# Patient Record
Sex: Female | Born: 1985
Health system: Southern US, Community
[De-identification: ages and names within clinical notes are randomized; demographics above are authoritative.]

## PROBLEM LIST (undated history)

## (undated) DIAGNOSIS — F32A Depression, unspecified: Secondary | ICD-10-CM

## (undated) DIAGNOSIS — N750 Cyst of Bartholin's gland: Secondary | ICD-10-CM

## (undated) DIAGNOSIS — R519 Headache, unspecified: Secondary | ICD-10-CM

## (undated) DIAGNOSIS — Z8742 Personal history of other diseases of the female genital tract: Secondary | ICD-10-CM

## (undated) DIAGNOSIS — F329 Major depressive disorder, single episode, unspecified: Secondary | ICD-10-CM

## (undated) DIAGNOSIS — R51 Headache: Secondary | ICD-10-CM

## (undated) DIAGNOSIS — J45909 Unspecified asthma, uncomplicated: Secondary | ICD-10-CM

## (undated) DIAGNOSIS — F209 Schizophrenia, unspecified: Secondary | ICD-10-CM

## (undated) DIAGNOSIS — Q513 Bicornate uterus: Secondary | ICD-10-CM

## (undated) DIAGNOSIS — F4310 Post-traumatic stress disorder, unspecified: Secondary | ICD-10-CM

## (undated) DIAGNOSIS — K219 Gastro-esophageal reflux disease without esophagitis: Secondary | ICD-10-CM

## (undated) DIAGNOSIS — G4730 Sleep apnea, unspecified: Secondary | ICD-10-CM

## (undated) DIAGNOSIS — S62509A Fracture of unspecified phalanx of unspecified thumb, initial encounter for closed fracture: Secondary | ICD-10-CM

## (undated) DIAGNOSIS — F419 Anxiety disorder, unspecified: Secondary | ICD-10-CM

## (undated) HISTORY — DX: Unspecified asthma, uncomplicated: J45.909

## (undated) HISTORY — DX: Schizophrenia, unspecified: F20.9

## (undated) HISTORY — PX: CARDIAC CATHETERIZATION: SHX172

## (undated) HISTORY — PX: WISDOM TOOTH EXTRACTION: SHX21

## (undated) HISTORY — DX: Post-traumatic stress disorder, unspecified: F43.10

## (undated) HISTORY — PX: APPENDECTOMY: SHX54

## (undated) HISTORY — PX: DIAGNOSTIC LAPAROSCOPY: SUR761

---
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
## 2004-06-21 ENCOUNTER — Emergency Department (HOSPITAL_COMMUNITY): Admission: EM | Admit: 2004-06-21 | Discharge: 2004-06-22 | Payer: Self-pay | Admitting: Emergency Medicine

## 2004-06-22 ENCOUNTER — Inpatient Hospital Stay (HOSPITAL_COMMUNITY): Admission: EM | Admit: 2004-06-22 | Discharge: 2004-06-26 | Payer: Self-pay | Admitting: Psychiatry

## 2004-09-04 ENCOUNTER — Encounter (INDEPENDENT_AMBULATORY_CARE_PROVIDER_SITE_OTHER): Payer: Self-pay | Admitting: *Deleted

## 2004-09-04 ENCOUNTER — Inpatient Hospital Stay (HOSPITAL_COMMUNITY): Admission: EM | Admit: 2004-09-04 | Discharge: 2004-09-05 | Payer: Self-pay | Admitting: Emergency Medicine

## 2004-09-04 HISTORY — PX: OTHER SURGICAL HISTORY: SHX169

## 2004-10-16 ENCOUNTER — Emergency Department (HOSPITAL_COMMUNITY): Admission: EM | Admit: 2004-10-16 | Discharge: 2004-10-16 | Payer: Self-pay | Admitting: Emergency Medicine

## 2004-10-27 ENCOUNTER — Emergency Department: Payer: Self-pay | Admitting: Emergency Medicine

## 2004-11-16 ENCOUNTER — Other Ambulatory Visit: Payer: Self-pay

## 2004-11-16 ENCOUNTER — Emergency Department: Payer: Self-pay | Admitting: Emergency Medicine

## 2004-11-21 ENCOUNTER — Ambulatory Visit: Payer: Self-pay | Admitting: Emergency Medicine

## 2006-01-06 ENCOUNTER — Emergency Department (HOSPITAL_COMMUNITY): Admission: EM | Admit: 2006-01-06 | Discharge: 2006-01-06 | Payer: Self-pay | Admitting: Emergency Medicine

## 2006-01-15 ENCOUNTER — Emergency Department (HOSPITAL_COMMUNITY): Admission: EM | Admit: 2006-01-15 | Discharge: 2006-01-15 | Payer: Self-pay | Admitting: Emergency Medicine

## 2006-01-20 ENCOUNTER — Inpatient Hospital Stay (HOSPITAL_COMMUNITY): Admission: AD | Admit: 2006-01-20 | Discharge: 2006-01-20 | Payer: Self-pay | Admitting: Obstetrics & Gynecology

## 2006-01-24 ENCOUNTER — Ambulatory Visit: Payer: Self-pay | Admitting: Obstetrics and Gynecology

## 2006-01-28 ENCOUNTER — Emergency Department (HOSPITAL_COMMUNITY): Admission: EM | Admit: 2006-01-28 | Discharge: 2006-01-29 | Payer: Self-pay | Admitting: Emergency Medicine

## 2006-03-18 ENCOUNTER — Ambulatory Visit: Admission: RE | Admit: 2006-03-18 | Discharge: 2006-03-18 | Payer: Self-pay | Admitting: Obstetrics and Gynecology

## 2006-03-18 ENCOUNTER — Encounter (INDEPENDENT_AMBULATORY_CARE_PROVIDER_SITE_OTHER): Payer: Self-pay | Admitting: *Deleted

## 2006-03-18 ENCOUNTER — Ambulatory Visit: Payer: Self-pay | Admitting: Gynecology

## 2006-03-18 HISTORY — PX: OTHER SURGICAL HISTORY: SHX169

## 2006-04-01 ENCOUNTER — Ambulatory Visit: Payer: Self-pay | Admitting: *Deleted

## 2006-04-01 ENCOUNTER — Ambulatory Visit: Payer: Self-pay | Admitting: Family Medicine

## 2006-04-03 ENCOUNTER — Ambulatory Visit: Payer: Self-pay | Admitting: Obstetrics and Gynecology

## 2006-04-17 ENCOUNTER — Ambulatory Visit: Payer: Self-pay | Admitting: Family Medicine

## 2006-04-22 ENCOUNTER — Ambulatory Visit: Payer: Self-pay | Admitting: Family Medicine

## 2008-11-12 ENCOUNTER — Emergency Department (HOSPITAL_COMMUNITY): Admission: EM | Admit: 2008-11-12 | Discharge: 2008-11-12 | Payer: Self-pay | Admitting: Emergency Medicine

## 2009-03-16 ENCOUNTER — Encounter: Payer: Self-pay | Admitting: Family

## 2009-03-16 ENCOUNTER — Encounter: Payer: Self-pay | Admitting: Obstetrics and Gynecology

## 2009-03-16 ENCOUNTER — Ambulatory Visit: Payer: Self-pay | Admitting: Obstetrics & Gynecology

## 2009-03-22 ENCOUNTER — Ambulatory Visit (HOSPITAL_COMMUNITY): Admission: RE | Admit: 2009-03-22 | Discharge: 2009-03-22 | Payer: Self-pay | Admitting: Obstetrics & Gynecology

## 2009-03-23 ENCOUNTER — Ambulatory Visit: Payer: Self-pay | Admitting: Obstetrics & Gynecology

## 2010-06-27 ENCOUNTER — Ambulatory Visit: Payer: Self-pay | Admitting: Gastroenterology

## 2010-09-20 ENCOUNTER — Ambulatory Visit: Payer: Self-pay | Admitting: Gastroenterology

## 2010-10-10 ENCOUNTER — Ambulatory Visit: Payer: Self-pay | Admitting: Surgery

## 2011-04-12 LAB — POCT URINALYSIS DIP (DEVICE)
Bilirubin Urine: NEGATIVE
Glucose, UA: NEGATIVE mg/dL
Hgb urine dipstick: NEGATIVE
Ketones, ur: NEGATIVE mg/dL
Nitrite: NEGATIVE
Protein, ur: NEGATIVE mg/dL
Specific Gravity, Urine: 1.01 (ref 1.005–1.030)
Urobilinogen, UA: 0.2 mg/dL (ref 0.0–1.0)
pH: 7 (ref 5.0–8.0)

## 2011-05-15 NOTE — Group Therapy Note (Signed)
NAME:  [name], [name] NO.:  [id number]   MEDICAL RECORD NO.:  [id number]          PATIENT TYPE:  WOC   LOCATION:  [facility]                   FACILITY:  [facility]   PHYSICIAN:  Sid Falcon, CNM  DATE OF BIRTH:  Mar 19, 1986   DATE OF SERVICE:                                  CLINIC NOTE   The patient is here for left-sided pelvic pain that has been occurring  for the last year and a half, __________ the last two weeks.  The  patient has a history of laparoscopic removal of right urine horn and  right salpingo-oophorectomy in March of 2007.  The patient has reported  that due to significant hyperplasia.  The patient has reported similar  symptoms now on the right side, denies at level bleeding, no change in  bowel pattern.  No weight loss.  No abnormal discharge.  Urinary  frequency x1 year.  She saw urologist in [place] and __________ and  follow-up tests were negative.  The patient is rating the pain a 10/10  at its worst and desires treatment as indicated.  Last Pap smear was  three years ago.  Denies history of abnormal Paps.  The patient is a  lesbian and in a relationship with a female, denies pregnancy.   PAST SURGICAL HISTORY:  Appendectomy.   PHYSICAL EXAMINATION:  GENERAL:  The patient is alert and oriented x3,  no signs of acute distress.  PELVIC:  Uterus enlarged, palpated past below the umbilicus.  Adnexa  tender above.  No dominant masses.  Cervix:  No abnormal lesion, no  abnormal discharge.  Pap smear obtained without difficulty.  NECK:  No cervical motion tenderness.   ASSESSMENT:  Chronic pelvic pain.   PLAN:  Obtain a pelvic ultrasound, possible laparoscopic procedure.  Await ultrasound results.  The  patient will follow up in one week to  review the results of the ultrasound.      Sid Falcon, CNM     WM/MEDQ  D:  03/16/2009  T:  03/16/2009  Job:  [id number]

## 2011-05-15 NOTE — Group Therapy Note (Signed)
NAMEKHLOEY, CHERN NO.:  [id number]   MEDICAL RECORD NO.:  [id number]          PATIENT TYPE:  WOC   LOCATION:  [facility]                   FACILITY:  [facility]   PHYSICIAN:  Johnella Moloney, MD        DATE OF BIRTH:  Feb 26, 1986   DATE OF SERVICE:  03/23/2009                                  CLINIC NOTE   [name] is a 25 year old female that returns to the clinic for ultrasound  results after chronic left-sided pelvic and abdominal pain.  She states  that the pain waxes and wanes, no associated nausea, vomiting, diarrhea  or bloody stools with it.   PHYSICAL EXAM:  Temperature is 97.4, pulse is 80, blood pressure 99/64,  weight is 126.5, height is 5 feet 4.  GENERAL:  Patient well-appearing, no apparent distress.  Alert and  oriented x4.  ABDOMEN:  Soft, nontender, nondistended.  No palpable masses.  No  hepatosplenomegaly.   X-RAY RESULTS:  Pelvic and transvaginal ultrasound done on March 22, 2009 showed normal uterus and left ovary.  Also showed previous right  oophorectomy, no evidence of pelvic mass or acute findings.   ASSESSMENT:  This is a 25 year old female with a history of multiple  personality disorder and also with history of chronic left-sided pelvic  and abdominal pain with a normal ultrasound.   PLAN:  Patient was reassured that this is not GYN related.  Referred  patient to [facility] to establish health care, possibly with  [facility].  Patient was instructed that this could possibly be GI and  be worked up for possible IBS or other GI etiologies.  Patient was also  instructed to return if her pain worsens, if she has symptoms of severe  pain, nausea, vomiting, fever, chills.  Patient was in agreement with  the plan and will follow up as needed.     ______________________________  Jonn Shingles, MD    ______________________________  Johnella Moloney, MD    KL/MEDQ  D:  03/23/2009  T:  03/23/2009  Job:  [id number]

## 2011-05-18 NOTE — Group Therapy Note (Signed)
[name], [name] NO.:  [id number]   MEDICAL RECORD NO.:  [id number]          PATIENT TYPE:  WOC   LOCATION:  [facility]                   FACILITY:  [facility]   PHYSICIAN:  Argentina Donovan, MD        DATE OF BIRTH:  19-Apr-1986   DATE OF SERVICE:  01/24/2006                                    CLINIC NOTE   The patient is a 25 year old nulligravida lesbian white female who has  chronic right lower quadrant pain extending from the inguinal area up to  about the umbilicus.  The pain is tender to palpation without rebound or  guarding.  The patient had laparoscopic surgery two years ago at which time  she had an appendectomy and drain of an ovarian cyst for the same reason.  The pain is worse with her periods, although she has it constantly and  during her period but prior to her period she develops nausea and vomiting  and is really quite disabled according to her aunt who takes care of her.  She is 108 pounds, is 5 feet 4 inches tall.   PHYSICAL EXAMINATION:  ABDOMEN:  Soft, flat, nontender except as above  mentioned with no sign of organomegaly.  PELVIC:  Vagina is clean.  She is having a period at this time.  We did a  Pap smear for the first time.  The cervix is nulliparous.  Uterus normal  size, shape, and consistency.  The adnexa seems normal.   The patient had a recent CT and ultrasound.  The CT is disturbing because it  showed what looked like a cyst as well as a normal appendix, although the  patient has no appendix.  The sonogram showed a normal pelvis with no signs  of cysts.  Whether this young lady has endometriosis or scar tissue in the  pelvis or recurrent ovarian cysts or an ovary that is trapped in adhesions I  do not know but I think at least she deserves a laparoscopic examination.  She is also begging for an oophorectomy on that side.  We have told her the  disadvantages of that but if we find anything that is wrong with that ovary  we will remove  it.  We are going to schedule her for laparoscopic  examination ASAP with a possible exploratory laparotomy.           ______________________________  Argentina Donovan, MD     PR/MEDQ  D:  01/24/2006  T:  01/25/2006  Job:  [id number]

## 2011-05-18 NOTE — H&P (Signed)
[name], [name] NO.:  [id number]   MEDICAL RECORD NO.:  [id number]                   PATIENT TYPE:  INP   LOCATION:  5506                                 FACILITY:  [facility]   PHYSICIAN:  Velora Heckler, M.D.                DATE OF BIRTH:  March 21, 1986   DATE OF ADMISSION:  09/03/2004  DATE OF DISCHARGE:                                HISTORY & PHYSICAL   REFERRING PHYSICIAN:  Carleene Cooper, M.D.   CHIEF COMPLAINT:  Abdominal pain, nausea, vomiting, probable acute  appendicitis.   HISTORY OF PRESENT ILLNESS:  The patient is a 25 year old white female who  presents to the emergency department with 30-hour history of abdominal pain  localized to the right lower quadrant.  This is associated with onset of  nausea, vomiting, fevers, chills, and sweats.  The patient has remained  anorexic.  She was seen in the emergency department and noted to have a  normal white blood cell count.  CT scan abdomen and pelvis was obtained and  shows a fluid-filled mass in the right lower quadrant consistent with either  acute appendicitis or abscess or possibly an ovarian abnormality.  The  patient relates a history of intermittent pain for approximately six months.  However, the past 24 hours has been significantly more intense, again  associated with nausea, vomiting, and fever.  General surgery is consulted  at this time for consideration of appendectomy.   PAST MEDICAL HISTORY:  History of schizophrenia, no prior surgical  intervention.   MEDICATIONS:  Celexa, Seroquel, Valium.   ALLERGIES:  None known.   SOCIAL HISTORY:  The patient lives with her aunt.  She smokes a pack of  cigarettes a day.  She drinks alcohol in moderation.  She denies illicit  drug use.  She works part-time at the [employer].   FAMILY HISTORY:  Noncontributory.   REVIEW OF SYSTEMS:  Fifteen-system review without significant other  positive.   PHYSICAL EXAMINATION:  GENERAL:  A  24 year old thin white female on a  stretcher in the emergency department.  VITAL SIGNS:  Temperature 97.1, pulse 80, respirations 28, blood pressure  101/48.  HEENT:  Normocephalic.  Sclerae are clear.  Pupils are equal and reactive.  Dentition is good.  Mucous membranes are dry.  Voice is normal.  NECK:  Supple without mass.  Thyroid is normal without nodularity.  There is  no lymphadenopathy.  CHEST:  Lungs are clear to auscultation bilaterally without rales, rhonchi,  or wheeze.  CARDIAC:  Regular rate and rhythm without murmur.  Peripheral pulses are  full.  ABDOMEN:  Soft, scaphoid.  There is a tattoo in the right lower quadrant.  There are no surgical wounds.  There is no hepatosplenomegaly.  There is  diffuse tenderness, maximum in the right lower quadrant.  There is voluntary  guarding.  The patient will not  allow testing for rebound.  There is no sign  of hernia.  There are no surgical wounds.  EXTREMITIES:  Nontender without edema.  NEUROLOGIC:  The patient is alert and oriented without focal deficit.   LABORATORY DATA:  White count 7.5, hemoglobin 12.8, platelet count 204,000.  Differential is normal with 60% neutrophils, 32% lymphocytes, and 6%  monocytes.  Urinalysis is benign.  Urine pregnancy test is negative.  I-STAT  8:  Electrolytes are within normal limits.   RADIOGRAPHIC STUDIES:  CT scan abdomen and pelvis reviewed in the emergency  department shows a 3 x 4 cm fluid-filled mass lateral to the cecum in the  right lower quadrant.   IMPRESSION:  Probable acute appendicitis, possible periappendiceal abscess.   PLAN:  1.  Admission to [facility].  2.  Initiation of intravenous antibiotics.  3.  To operating room for laparoscopy and possible laparoscopic      appendectomy.  4.  Routine postoperative care.   I discussed at length with the patient and her length and her aunt, who  accompanies her, the indications for surgery.  I explained the location  of  the surgical wounds.  I discussed the possibility of conversion to open  incision.  I discussed her hospital stay to be expected.  They understand  and wish to proceed.  Will make arrangements with the operating room  immediately.                                                Velora Heckler, M.D.    TMG/MEDQ  D:  09/04/2004  T:  09/04/2004  Job:  [id number]

## 2011-05-18 NOTE — Discharge Summary (Signed)
[name], [name]                               ACCOUNT NO.:  [id number]   MEDICAL RECORD NO.:  [id number]                   PATIENT TYPE:  INP   LOCATION:  0101                                 [facility]   PHYSICIAN:  Cindie Crumbly, M.D.               DATE OF BIRTH:  [birth year]   DATE OF ADMISSION:  03/08/2003  DATE OF DISCHARGE:  03/12/2003                                 DISCHARGE SUMMARY   REASON FOR ADMISSION:  This 25 year old white female was admitted  complaining of depression status post overdose as a suicide attempt.  For  further history of present illness, please see the patient's psychiatric  admission assessment.   PHYSICAL EXAMINATION:  At the time of admission was significant for a  history of allergic rhinitis.  She also had a history of arthritis of her  hip from congenital hip dysplasia.  She had an otherwise unremarkable  physical examination.   LABORATORY DATA:  The patient underwent a laboratory workup to rule out any  medical problems contributing to her symptomatology.  A hepatic panel was  within normal limits.  GGT was within normal limits.  TSH and free T4 were  within normal limits.  UA was unremarkable.  RPR was nonreactive.  Urine  probe for gonorrhea and chlamydia were negative.  The patient received no x-  rays, no special procedures, no additional consultations.  She sustained no  complications during the course of this hospitalization.  All further  laboratory examination was done at [facility] prior to the  patient being admitted to this facility for a more definitive stabilization.   HOSPITAL COURSE:  On admission, the patient was psychomotor agitated with  decreased concentration and poor impulse control.  She displayed an  excessive reliance on histrionic and borderline defense mechanisms.  Her  affect and mood were depressed, irritable, angry and anxious.  She was  initially begun on a trial of Zoloft but developed  nausea and vomiting.  After an additional risks/benefits discussion, the patient was started on a  trial of Celexa, tolerated this medication well without side effects and, at  the time of discharge, she denies any suicidal or homicidal ideation.  She  remained somewhat superficial and guarded but is able to contract for  safety, is motivated for outpatient therapy and, consequently, is felt to  have reached her maximum benefits of hospitalization and is ready for  discharge to a less restrictive alternative setting.  She has displayed no  evidence of a thought disorder throughout her hospital course.   CONDITION ON DISCHARGE:  Improved.   DIAGNOSES (ACCORDING TO DSM-IV):   AXIS I:  1. Major depression, single episode, severe without psychosis.  2. Nicotine dependence.  3. Cannabis abuse.  4. Rule out polysubstance dependence.  5. Rule out substance-induced mood disorder.   AXIS II:  1. Histrionic and borderline traits.  2. Rule out personality disorder not otherwise specified.  3. Rule out learning disorder not otherwise specified.   AXIS III:  1. Allergic rhinitis.  2. Congential hip dysplasia.   AXIS IV:  Current psychosocial stressors are severe.   AXIS V:  20 on admission; 30 on discharge.   FURTHER EVALUATION AND TREATMENT RECOMMENDATIONS:  1. The patient is discharged to home.  2. She is discharged on Paxil CR 12.5 mg p.o. q.a.m.  3. She is discharged on an unrestricted level of activity and a regular     diet.  4. She will follow up with Dr. Mariana Single at [facility] outpatient clinic for all further aspects of her psychiatric care     and, consequently, I will sign off on the case at this time.  She will     follow up with her primary care physician for all further aspects of her     medical care.                                               Cindie Crumbly, M.D.    TS/MEDQ  D:  03/15/2003  T:  03/15/2003  Job:  [id number]

## 2011-05-18 NOTE — H&P (Signed)
[name], [name] NO.:  [id number]   MEDICAL RECORD NO.:  [id number]                   PATIENT TYPE:  INP   LOCATION:  0101                                 [facility]   PHYSICIAN:  Cindie Crumbly, M.D.               DATE OF BIRTH:  1986-11-08   DATE OF ADMISSION:  03/08/2003  DATE OF DISCHARGE:                         PSYCHIATRIC ADMISSION ASSESSMENT   j   PATIENT IDENTIFICATION:  This 25 year old white female was admitted  complaining of depression status post overdose on Friday, March 04, 2003,  with a bottle of approximately 30 ibuprofen tablets.  She told several  friends including her girlfriend.  Her girlfriend stated that she no longer  wished to have a relationship with her and the patient, at the present time,  states that she plans to shoot herself with a gun to kill herself and has  access and means and that her father has several guns in the household that  she has access to.   HISTORY OF PRESENT ILLNESS:  The patient admits to an increasingly  depressed, irritable, and angry mood most of the day nearly every day along  with increasing anxiety and anhedonia over the past two years and increasing  significantly over the past three to four months.  She admits to feelings of  hopelessness, helplessness, and worthlessness, decreased concentration and  energy level, increased symptoms of fatigue, insomnia, decreased appetite,  recurrent thoughts of death, psychomotor agitation, decreased school  performance, giving up on activities previously found pleasurable.  She  refuses to contract for safety at this time.   PAST PSYCHIATRIC HISTORY:  The patient's past psychiatric history is  significant for her being in outpatient therapy with Dr. Christell Constant for the past  two months for outpatient psychotherapy.  She has had no previous inpatient  hospitalizations and has no previous history of treatment with psychiatric  medications.   SUBSTANCE ABUSE HISTORY:  The patient's drug and alcohol abuse history is  significant for the patient smoking marijuana occasionally.  She also  reports smoking a half a pack of cigarettes per day for the past several  years.  She denies any use of alcohol or other street drugs.   PAST MEDICAL HISTORY:  She has a past medical history of seasonal allergic  rhinitis.  She presently takes Zyrtec 10 mg per day but states, It doesn't  work and I don't need to take it.   ALLERGIES:  She has no known drug allergies or sensitivities.   FAMILY AND SOCIAL HISTORY:  The patient lives with her mother, father,  sister, and brother.  She is currently in the 12th grade and plans on going  to junior college after completing this year in school.   MENTAL STATUS EXAM:  The patient presents as a well developed, well  nourished, adolescent white female who is alert and oriented x 4,  psychomotor agitated, and whose appearance is compatible with her stated  age.  She is disheveled, unkempt with poor hygiene, decreased concentration  and energy level, poor impulse control.  She displays and excessive reliance  on histrionic and borderline defense mechanisms.  Her affect and mood are  depressed, irritable, angry, and anxious.  Immediate recall, short-term  memory, and remote memory are intact.  Similarities and differences are  within normal limits and she is able to abstract to proverbs.  Her thought  processes are goal directed.   ADMISSION DIAGNOSES:   AXIS I:  1. Major depression, single episode, severe without psychosis.  2. Nicotine dependence.  3. Cannabis abuse.  4. Rule out polysubstance dependence.  5. Rule out substance-induced mood disorder.   AXIS II:  1. Histrionic and borderline traits.  2. Rule out personality disorder, not otherwise specified.  3. Rule out learning disorder, not otherwise specified.   AXIS III:  Allergic rhinitis.   AXIS IV:  Severe.   AXIS V:  20   ASSETS  AND STRENGTHS:  Her parents are supportive of her.   INITIAL PLAN OF CARE:  Initial plan of care is to begin the patient on a  trial of Zoloft for her depression.  She has begun on admission on a p.r.n.  basis receiving Remeron for sleep.  Psychotherapy will focus on improving  the patient's impulse control, decreasing cognitive distortions and  potential for self-harm.  A laboratory workup will also be initiated to rule  out any other medical problems contributing to her symptomatology.   ESTIMATED LENGTH OF STAY:  The estimated length of stay for the patient on  the inpatient unit is five to seven days.   POST HOSPITAL CARE PLAN:  Initial discharge plan is to discharge the patient  to home.                                               Cindie Crumbly, M.D.    TS/MEDQ  D:  03/09/2003  T:  03/09/2003  Job:  [id number]

## 2011-05-18 NOTE — Op Note (Signed)
NAMEANASTAZJA, ISAAC NO.:  [id number]   MEDICAL RECORD NO.:  [id number]                   PATIENT TYPE:  INP   LOCATION:  5506                                 FACILITY:  [facility]   PHYSICIAN:  Velora Heckler, M.D.                DATE OF BIRTH:  1986/12/16   DATE OF PROCEDURE:  09/04/2004  DATE OF DISCHARGE:                                 OPERATIVE REPORT   PREOPERATIVE DIAGNOSIS:  Acute appendicitis.   POSTOPERATIVE DIAGNOSIS:  Ovarian cyst, normal appendix.   OPERATION PERFORMED:  1.  Laparoscopic appendectomy.  2.  Fenestration of ovarian cyst.   SURGEON:  Velora Heckler, M.D.   ANESTHESIA:  General.   ESTIMATED BLOOD LOSS:  Minimal.   PREPARATION:  Betadine.   COMPLICATIONS:  None.   INDICATIONS FOR PROCEDURE:  The patient is a 25 year old white female who  presents to the emergency department with greater than 24 hour history of  right lower quadrant pain.  This has been associated with nausea, vomiting,  fever, chills and sweats over the past 24 hours.  The patient is now  anorexic.  She was evaluated by the emergency room doctors.  White blood  cell count was normal.  Differential was normal.  However, CT scan of the  abdomen and pelvis showed a fluid filled mass in the right lower quadrant  worrisome for acute appendicitis versus possible abscess.  The patient is  now brought to the operating room for laparoscopy and appendectomy.   DESCRIPTION OF PROCEDURE:  The procedure was done in operating room #16 at  the [facility].  The patient was brought to the  operating room and placed in supine position on the operating room table.  Following administration of general anesthesia, the patient was prepped and  draped in the usual strict aseptic fashion.  After ascertaining that an  adequate level of anesthesia had been obtained, a supraumbilical incision  was made in the midline.  Dissection was carried down to the  fascia.  The  fascia was incised in the midline.  The peritoneal cavity was entered  cautiously.  0 Vicryl pursestring suture was placed in the fascia.  A Hasson  cannula was introduced under direct vision and secured with the pursestring  suture.  The abdomen was insufflated with carbon dioxide.  The laparoscope  was introduced under direct vision and the abdomen explored.  Representative  photographs were taken.  There was a large cystic mass in the colic gutter  lateral to the mid ascending colon.  Operative ports were placed in the  right upper quadrant and left lower quadrant.  Using a Glassman clamp, the  cyst was manipulated.  There is a structure representing the tube and ovary  with came out of the pelvis, over the pelvic brim and into the right colic  gutter.  The cyst  was associated with the superior aspect of the ovary.  During manipulation the cyst was accidentally ruptured.  It contained dark  bloody colored fluid.  This was evacuated.  The cyst decompressed nearly  completely.  Further photographs were taken.  The cecum was mobilized with  the Glassman clamp with the patient in steep Trendelenburg.  The appendix  was exposed.  It appeared grossly normal.  Using the Harmonic scalpel, the  mesoappendix was taken down to the base of the appendix.  The base of the  appendix was transected with an Endo GIA stapler.  Good hemostasis was noted  at the staple line.  Representative photographs were taken of the staple  line on the cecal wall.  The appendix was placed into an EndoCatch bag and  withdrawn through the left lower quadrant port.  The abdomen was irrigated  with saline which was evacuated.  Ports were removed under direct vision.  Good hemostasis was noted.  All ports were removed and pneumoperitoneum was  released.  0 Vicryl pursestring sutures tied securely.  Operative sites were  anesthetized with local anesthetic.  All three wounds were closed with  interrupted 4-0  subcuticular suture.  Wounds were washed and dried.  Benzoin  and Steri-Strips were applied.  Sterile dressings were applied.  The patient  was awakened from anesthesia and brought to the recovery room in stable  condition.  The patient tolerated the procedure well.                                               Velora Heckler, M.D.    TMG/MEDQ  D:  09/04/2004  T:  09/04/2004  Job:  [id number]
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
## 2011-05-18 NOTE — Discharge Summary (Signed)
NAMELULANI, BOUR NO.:  [id number]   MEDICAL RECORD NO.:  [id number]                   PATIENT TYPE:  IPS   LOCATION:  0305                                 [facility]ACILITY:  [facility]   PHYSICIAN:  Jeanice Lim, M.D.              DATE OF BIRTH:  1986/01/16   DATE OF ADMISSION:  10/05/2003  DATE OF DISCHARGE:  10/09/2003                                 DISCHARGE SUMMARY   IDENTIFYING DATA:  This is an 25 year old single Caucasian female  readmitted.  The patient had gone to live with the aunt in the no stress  zone but reported that she was upset over the weekend about a painful  separation, knew that mother was upset over support issues, and thought it  would be better if she wasn't here.  The patient was having suicidal  thoughts, vague, and reports that she felt worthless and should just end  all.  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   MEDICATIONS:  1. Claritin.  2. Risperdal.  3. Valium.  4. Celexa.   DRUG ALLERGIES:  No known drug allergies.   PHYSICAL EXAMINATION:  GENERAL:  Essentially within normal limits.  NEUROLOGIC:  Nonfocal.   LABORATORY DATA:  Routine admission labs: Within normal limits.   MENTAL STATUS EXAM:  Fully alert, some psychomotor slowing, mild.  Speech:  Within normal limits, monotone.  Mood: Depressed.  Affect: Restricted.  Thought process: Goal directed.  Thought content: Positive for auditory  hallucinations, suicidal ideation, and visual hallucinations prior to  admission.  Cognitive: Intact.  She tended to minimize the severity of her  symptoms.   ADMISSION DIAGNOSES:   AXIS I:  Major depressive disorder, recurrent with psychosis versus rule out  bipolar II.   AXIS II:  Deferred.   AXIS III:  Allergic rhinitis.   AXIS IV:  Moderate problems related to limited support system and stress  related to school and life transition change.   AXIS V:  30/60   HOSPITAL COURSE:  The patient was  admitted, ordered routine p.r.n.  medications, underwent further monitoring, and was encouraged to participate  in individual, group, and milieu therapy.  She underwent safety monitoring.  She gradually reported an increase in insight, feeling upset about parents'  separation but reported a decrease in lability, increase in coping skills,  denying any suicidal thoughts after being admitted, reporting that she felt  she had a more positive attitude and things were more in perspective.  She  had lost sight of things but now was feeling she could cope with her stress  and there was no suicidal thoughts or other dangerous ideation.  She  demonstrated improved judgment and insight.  The patient showed a response  to clinical intervention.   CONDITION ON DISCHARGE:  Her condition on discharge was markedly improved.  Mood was more euthymic.  Affect:  Brighter.  Thought processes: Goal  directed.  Thought content: Negative for dangerous ideation or psychotic  symptoms.  The patient reported motivation to be compliant with the  aftercare plan.   DISCHARGE MEDICATIONS:  1. Claritin 10 mg q.a.m.  2. Risperdal 0.5 mg one half q.a.m., one half at 3 p.m., and four q.h.s.  3. Lexapro 20 mg one and a half q.a.m.  4. Valium 10 mg one half q.a.m., one half at 3 p.m., and one q.h.s.  5. Ambien 10 mg q.h.s. p.r.n. insomnia.   FOLLOW UP:  The patient was to follow up with Milagros Evener, M.D., and [facility].   DISCHARGE DIAGNOSES:   AXIS I:  Major depressive disorder, recurrent with psychosis versus rule out  bipolar II.   AXIS II:  Deferred.   AXIS III:  Allergic rhinitis.   AXIS IV:  Moderate problems related to limited support system and stress  related to school and life transition change.   AXIS V:  Global assessment of functioning on discharge was 55.                                               Jeanice Lim, M.D.    JEM/MEDQ  D:  11/03/2003  T:  11/04/2003  Job:  [id number]

## 2011-05-18 NOTE — Discharge Summary (Signed)
[name], HORKEY NO.:  [id number]   MEDICAL RECORD NO.:  [id number]                   PATIENT TYPE:  IPS   LOCATION:  0301                                 [facility]ACILITY:  [facility]   PHYSICIAN:  Jeanice Lim, M.D.              DATE OF BIRTH:  1986/12/13   DATE OF ADMISSION:  09/26/2003  DATE OF DISCHARGE:  09/29/2003                                 DISCHARGE SUMMARY   IDENTIFYING DATA:  This is an 25 year old single Caucasian female  voluntarily admitted with a history of a suicide attempt by overdosing on  aspirin.  Had been hospitalized previously for suicidal thoughts.  Referred  by her therapist in the past.  The patient reported hearing voices on  occasion at night, telling her to hurt herself.  She had broken up with a  friend a few weeks ago and feels she has limited support.  The patient had  side effects to past medications.   MEDICATIONS:  Amoxicillin, Valium 5 mg b.i.d. and Risperdal 2 mg q.h.s.   ALLERGIES:  No known drug allergies.   PHYSICAL EXAMINATION:  Essentially within normal limits.  Neurologically  nonfocal.   LABORATORY DATA:  Routine admission labs within normal limits.   MENTAL STATUS EXAM:  Fully alert, cooperative, depressed.  Speech soft,  apathetic.  Affect flat.  Thought processes goal directed and thought  content positive for suicidal ideation without plan.  Cognitively intact.  Judgment and insight fair to poor.  Positive auditory and visual  hallucinations prior to admission.   ADMISSION DIAGNOSES:   AXIS I:  Rule out major depression not otherwise specified.   AXIS II:  Deferred.   AXIS III:  Rhinosinusitis.   AXIS IV:  Severe (stress at home and problems with primary support group).   AXIS V:  28/55-60.   HOSPITAL COURSE:  The patient was admitted and ordered routine p.r.n.  medications and underwent further monitoring.  Was encouraged to participate  in individual, group and milieu therapy.  The  patient reported having  suicidal ideation prior to admission.  Had a gun and bullets to shoot  herself.  Felt ignored.  Positive auditory and visual hallucinations for 1-2  years, which increase when stressed.  Some auditory hallucinations tell her  to kill herself.  The patient had broken up with a girlfriend just prior to  admission and parents were separating.  The patient was optimized on Valium  and Risperdal targeting acute anxiety and psychotic symptoms.  The patient  participated fully in therapy, reported a positive response to medication  changes and clinical intervention with no side effects.  Reported almost  complete resolution of voices after hearing them for two years, which was a  relief.  Follow-up on the gun to ensure that it was secured prior to  discharge.   CONDITION ON DISCHARGE:  The patient was discharged in  improved condition.  Mood euthymic.  Affect brighter.  Thought processes goal directed.  Thought  content negative for dangerous ideation or psychotic symptoms.   DISCHARGE MEDICATIONS:  1. Augmentin 875 mg b.i.d. x 4 days.  2. Claritin 10 mg q.a.m.  3. Risperdal 0.5 mg, 1/2 q.a.m., 1/2 at 3 p.m. and 4 q.h.s.  4. Valium 10 mg, 1/2 q.a.m., 1/2 at 3 p.m. and 1 q.h.s.  5. Celexa 20 mg q.a.m.  6. Ambien 10 mg q.h.s. p.r.n. insomnia.   FOLLOW UP:  The patient was to follow up for medication monitoring within  seven days and therapy.   DISCHARGE DIAGNOSES:   AXIS I:  Rule out major depression not otherwise specified.   AXIS II:  Deferred.   AXIS III:  Rhinosinusitis.   AXIS IV:  Severe (stress at home and problems with primary support group).   AXIS V:  Global Assessment of Functioning on discharge 55.                                               Jeanice Lim, M.D.    JEM/MEDQ  D:  11/02/2003  T:  11/02/2003  Job:  [id number]

## 2011-05-18 NOTE — Op Note (Signed)
NAME:  [name], [name]                   ACCOUNT NO.:  [id number]   MEDICAL RECORD NO.:  [id number]          PATIENT TYPE:  AMB   LOCATION:  DFTL                          FACILITY:  [facility]   PHYSICIAN:  Ginger Carne, MD  DATE OF BIRTH:  12/01/1986   DATE OF PROCEDURE:  03/18/2006  DATE OF DISCHARGE:                                 OPERATIVE REPORT   PREOPERATIVE DIAGNOSIS:  Chronic right lower quadrant pain.   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   OPERATIVE PROCEDURE:  Laparoscopic excision of hypoplastic right uterine  horn and laparoscopic right salpingo-oophorectomy and cystoscopy with indigo  carmine insufflation.   SURGEON:  Ginger Carne, M.D.   ASSISTANT:  Dr. Okey Dupre.   ESTIMATED BLOOD LOSS:  Minimal.   COMPLICATIONS:  None immediate.   ANESTHESIA:  General.   SPECIMEN:  Hypoplastic right uterine horn and right tube and ovary.   OPERATIVE FINDINGS:  External genitalia, vulva, vagina normal. Cervix smooth  without erosions or lesions. Laparoscopic evaluation revealed a normally  developed left uterine horn with tube and ovary. The right demonstrated  significant hypoplasia which tracked to the utero-ovarian ligament on the  right side and elongated right ovary and portion of the distal ampullary  fimbriated region of the right tube. The ureter was identified throughout  its pelvic course. Cystoscopy revealed a normal-appearing bladder dome,  posterior wall, trigone and lateral walls were normal. There was minimal  metaplasia in the trigone area. Both ureteral orifices were identified and  spilled Indigo Carmine dye satisfactorily. Upper abdomen normal. The  appendix had been surgically removed and confirmed. On inspection of the  cecum large and small bowel grossly normal. Left ureter identified. No  evidence of endometriosis or chronic pelvic inflammatory disease, liver edge  was smooth.   OPERATIVE PROCEDURE:  The patient prepped and draped in the usual fashion  and placed in the lithotomy position. Betadine solution used for antiseptic  and the bladder was catheterized prior to the procedure. After adequate  general anesthesia, a vertical infraumbilical incision was made. The Veress  needle was placed in the abdomen. Opening closing pressures were 10-15 mmHg.  Needle released and trocar placed in the same incision. Laparoscope placed  through the trocar sleeve. Two 5 mm ports were made in left lower quadrant,  left hypogastric regions. These were made under direct visualization. After  taking photography of the aforementioned findings. Attention was directed to  making a window to identify the ureter at all times. The proximal portion of  the right uterine horn was bipolar cauterized and cut adjacent to the left  __________. Using careful retraction and hemostasis with bipolar  cauterization, the right uterine horn was carefully excised from the  surrounding visceral peritoneum. The right round ligament was identified,  bipolar cauterized and cut where it attached to the right uterine horn.  Meticulous dissection of the peritoneum on both sides of the utero-ovarian  ligament was carried out once again with the ureter in view. The right ovary  and remnant  of tube were then carefully removed from their surrounding  peritoneal reflections. The ascending colon and cecum were in view at all  times to avoid injury to said structures. Bleeding points hemostatically  checked. Copious irrigation with lactated Ringer's with removal of irrigant  followed. No active bleeding noted. Specimen removed with an Endopouch bag  without difficulty. More irrigation followed by removal of irrigant followed  and no active bleeding noted. Gas released, trocars removed. Closure of the  10 mm fascia site with 0 Vicryl suture and 4-0 Vicryl for subcuticular  closure. At this point cystoscopy was  performed, aforementioned findings  noted. The patient tolerated the procedure well, returned post anesthesia  recovery room in excellent condition.      Ginger Carne, MD  Electronically Signed     SHB/MEDQ  D:  03/18/2006  T:  03/19/2006  Job:  [id number]

## 2011-10-02 LAB — DIFFERENTIAL
Basophils Absolute: 0
Basophils Relative: 0
Eosinophils Absolute: 0.1
Eosinophils Relative: 2
Lymphocytes Relative: 33
Lymphs Abs: 2.5
Monocytes Absolute: 0.3
Monocytes Relative: 4
Neutro Abs: 4.7
Neutrophils Relative %: 62

## 2011-10-02 LAB — BASIC METABOLIC PANEL
BUN: 4 — ABNORMAL LOW
CO2: 29
Calcium: 9.8
Chloride: 105
Creatinine, Ser: 0.6
GFR calc Af Amer: >60
GFR calc non Af Amer: >60
Glucose, Bld: 82
Potassium: 3.8
Sodium: 138

## 2011-10-02 LAB — URINALYSIS, ROUTINE W REFLEX MICROSCOPIC
Bilirubin Urine: NEGATIVE
Glucose, UA: NEGATIVE
Hgb urine dipstick: NEGATIVE
Ketones, ur: NEGATIVE
Nitrite: NEGATIVE
Protein, ur: NEGATIVE
Specific Gravity, Urine: 1.008
Urobilinogen, UA: 0.2
pH: 7.5

## 2011-10-02 LAB — CBC
HCT: 42.2
Hemoglobin: 14.1
MCHC: 33.5
MCV: 100.6 — ABNORMAL HIGH
Platelets: 217
RBC: 4.19
RDW: 12.3
WBC: 7.6

## 2011-10-02 LAB — PREGNANCY, URINE: Preg Test, Ur: NEGATIVE

## 2011-10-02 LAB — LITHIUM LEVEL: Lithium Lvl: 0.45 — ABNORMAL LOW

## 2012-03-26 ENCOUNTER — Ambulatory Visit (INDEPENDENT_AMBULATORY_CARE_PROVIDER_SITE_OTHER): Payer: 59 | Admitting: Women's Health

## 2012-03-26 ENCOUNTER — Encounter: Payer: Self-pay | Admitting: Women's Health

## 2012-03-26 ENCOUNTER — Other Ambulatory Visit (HOSPITAL_COMMUNITY)
Admission: RE | Admit: 2012-03-26 | Discharge: 2012-03-26 | Disposition: A | Discharge status: Home / Self Care | Payer: 59 | Source: Ambulatory Visit | Attending: Obstetrics and Gynecology | Admitting: Obstetrics and Gynecology

## 2012-03-26 VITALS — BP 110/74 | Ht 64.0 in | Wt 113.0 lb

## 2012-03-26 DIAGNOSIS — N750 Cyst of Bartholin's gland: Secondary | ICD-10-CM

## 2012-03-26 DIAGNOSIS — F17200 Nicotine dependence, unspecified, uncomplicated: Secondary | ICD-10-CM

## 2012-03-26 DIAGNOSIS — Z01419 Encounter for gynecological examination (general) (routine) without abnormal findings: Secondary | ICD-10-CM

## 2012-03-26 DIAGNOSIS — F17210 Nicotine dependence, cigarettes, uncomplicated: Secondary | ICD-10-CM | POA: Insufficient documentation

## 2012-03-26 DIAGNOSIS — Z833 Family history of diabetes mellitus: Secondary | ICD-10-CM

## 2012-03-26 LAB — CBC WITH DIFFERENTIAL/PLATELET
Basophils Absolute: 0 10*3/uL (ref 0.0–0.1)
Basophils Relative: 0 % (ref 0–1)
Eosinophils Absolute: 0 10*3/uL (ref 0.0–0.7)
Eosinophils Relative: 0 % (ref 0–5)
HCT: 41.8 % (ref 36.0–46.0)
Hemoglobin: 13.6 g/dL (ref 12.0–15.0)
Lymphocytes Relative: 28 % (ref 12–46)
Lymphs Abs: 2.8 10*3/uL (ref 0.7–4.0)
MCH: 32.5 pg (ref 26.0–34.0)
MCHC: 32.5 g/dL (ref 30.0–36.0)
MCV: 99.8 fL (ref 78.0–100.0)
Monocytes Absolute: 0.5 10*3/uL (ref 0.1–1.0)
Monocytes Relative: 5 % (ref 3–12)
Neutro Abs: 6.8 10*3/uL (ref 1.7–7.7)
Neutrophils Relative %: 67 % (ref 43–77)
Platelets: 250 10*3/uL (ref 150–400)
RBC: 4.19 MIL/uL (ref 3.87–5.11)
RDW: 12.6 % (ref 11.5–15.5)
WBC: 10.1 10*3/uL (ref 4.0–10.5)

## 2012-03-26 LAB — GLUCOSE, RANDOM: Glucose, Bld: 69 mg/dL — ABNORMAL LOW (ref 70–99)

## 2012-03-26 MED ORDER — DOXYCYCLINE HYCLATE 100 MG PO TABS: 100.0000 mg | ORAL_TABLET | Freq: Two times a day (BID) | ORAL | Status: AC

## 2012-03-26 MED ORDER — FLUCONAZOLE 150 MG PO TABS: 150.0000 mg | ORAL_TABLET | Freq: Once | ORAL | Status: AC

## 2012-03-26 NOTE — Patient Instructions (Addendum)
Health Maintenance, Females A healthy lifestyle and preventative care can promote health and wellness.  Maintain regular health, dental, and eye exams.   Eat a healthy diet. Foods like vegetables, fruits, whole grains, low-fat dairy products, and lean protein foods contain the nutrients you need without too many calories. Decrease your intake of foods high in solid fats, added sugars, and salt. Get information about a proper diet from your caregiver, if necessary.   Regular physical exercise is one of the most important things you can do for your health. Most adults should get at least 150 minutes of moderate-intensity exercise (any activity that increases your heart rate and causes you to sweat) each week. In addition, most adults need muscle-strengthening exercises on 2 or more days a week.    Maintain a healthy weight. The body mass index (BMI) is a screening tool to identify possible weight problems. It provides an estimate of body fat based on height and weight. Your caregiver can help determine your BMI, and can help you achieve or maintain a healthy weight. For adults 20 years and older:   A BMI below 18.5 is considered underweight.   A BMI of 18.5 to 24.9 is normal.   A BMI of 25 to 29.9 is considered overweight.   A BMI of 30 and above is considered obese.   Maintain normal blood lipids and cholesterol by exercising and minimizing your intake of saturated fat. Eat a balanced diet with plenty of fruits and vegetables. Blood tests for lipids and cholesterol should begin at age 20 and be repeated every 5 years. If your lipid or cholesterol levels are high, you are over 50, or you are a high risk for heart disease, you may need your cholesterol levels checked more frequently.Ongoing high lipid and cholesterol levels should be treated with medicines if diet and exercise are not effective.   If you smoke, find out from your caregiver how to quit. If you do not use tobacco, do not start.    If you are pregnant, do not drink alcohol. If you are breastfeeding, be very cautious about drinking alcohol. If you are not pregnant and choose to drink alcohol, do not exceed 1 drink per day. One drink is considered to be 12 ounces (355 mL) of beer, 5 ounces (148 mL) of wine, or 1.5 ounces (44 mL) of liquor.   Avoid use of street drugs. Do not share needles with anyone. Ask for help if you need support or instructions about stopping the use of drugs.   High blood pressure causes heart disease and increases the risk of stroke. Blood pressure should be checked at least every 1 to 2 years. Ongoing high blood pressure should be treated with medicines, if weight loss and exercise are not effective.   If you are 55 to 27 years old, ask your caregiver if you should take aspirin to prevent strokes.   Diabetes screening involves taking a blood sample to check your fasting blood sugar level. This should be done once every 3 years, after age 45, if you are within normal weight and without risk factors for diabetes. Testing should be considered at a younger age or be carried out more frequently if you are overweight and have at least 1 risk factor for diabetes.   Breast cancer screening is essential preventative care for women. You should practice "breast self-awareness." This means understanding the normal appearance and feel of your breasts and may include breast self-examination. Any changes detected, no matter how   small, should be reported to a caregiver. Women in their 20s and 30s should have a clinical breast exam (CBE) by a caregiver as part of a regular health exam every 1 to 3 years. After age 40, women should have a CBE every year. Starting at age 40, women should consider having a mammogram (breast X-ray) every year. Women who have a family history of breast cancer should talk to their caregiver about genetic screening. Women at a high risk of breast cancer should talk to their caregiver about having  an MRI and a mammogram every year.   The Pap test is a screening test for cervical cancer. Women should have a Pap test starting at age 21. Between ages 21 and 29, Pap tests should be repeated every 2 years. Beginning at age 30, you should have a Pap test every 3 years as long as the past 3 Pap tests have been normal. If you had a hysterectomy for a problem that was not cancer or a condition that could lead to cancer, then you no longer need Pap tests. If you are between ages 65 and 70, and you have had normal Pap tests going back 10 years, you no longer need Pap tests. If you have had past treatment for cervical cancer or a condition that could lead to cancer, you need Pap tests and screening for cancer for at least 20 years after your treatment. If Pap tests have been discontinued, risk factors (such as a new sexual partner) need to be reassessed to determine if screening should be resumed. Some women have medical problems that increase the chance of getting cervical cancer. In these cases, your caregiver may recommend more frequent screening and Pap tests.   The human papillomavirus (HPV) test is an additional test that may be used for cervical cancer screening. The HPV test looks for the virus that can cause the cell changes on the cervix. The cells collected during the Pap test can be tested for HPV. The HPV test could be used to screen women aged 30 years and older, and should be used in women of any age who have unclear Pap test results. After the age of 30, women should have HPV testing at the same frequency as a Pap test.   Colorectal cancer can be detected and often prevented. Most routine colorectal cancer screening begins at the age of 50 and continues through age 75. However, your caregiver may recommend screening at an earlier age if you have risk factors for colon cancer. On a yearly basis, your caregiver may provide home test kits to check for hidden blood in the stool. Use of a small camera at  the end of a tube, to directly examine the colon (sigmoidoscopy or colonoscopy), can detect the earliest forms of colorectal cancer. Talk to your caregiver about this at age 50, when routine screening begins. Direct examination of the colon should be repeated every 5 to 10 years through age 75, unless early forms of pre-cancerous polyps or small growths are found.   Hepatitis C blood testing is recommended for all people born from 1945 through 1965 and any individual with known risks for hepatitis C.   Practice safe sex. Use condoms and avoid high-risk sexual practices to reduce the spread of sexually transmitted infections (STIs). Sexually active women aged 25 and younger should be checked for Chlamydia, which is a common sexually transmitted infection. Older women with new or multiple partners should also be tested for Chlamydia. Testing for other   STIs is recommended if you are sexually active and at increased risk.   Osteoporosis is a disease in which the bones lose minerals and strength with aging. This can result in serious bone fractures. The risk of osteoporosis can be identified using a bone density scan. Women ages 5 and over and women at risk for fractures or osteoporosis should discuss screening with their caregivers. Ask your caregiver whether you should be taking a calcium supplement or vitamin D to reduce the rate of osteoporosis.   Menopause can be associated with physical symptoms and risks. Hormone replacement therapy is available to decrease symptoms and risks. You should talk to your caregiver about whether hormone replacement therapy is right for you.   Use sunscreen with a sun protection factor (SPF) of 30 or greater. Apply sunscreen liberally and repeatedly throughout the day. You should seek shade when your shadow is shorter than you. Protect yourself by wearing long sleeves, pants, a wide-brimmed hat, and sunglasses year round, whenever you are outdoors.   Notify your caregiver  of new moles or changes in moles, especially if there is a change in shape or color. Also notify your caregiver if a mole is larger than the size of a pencil eraser.   Stay current with your immunizations.  Document Released: 07/02/2011 Document Revised: 12/06/2011 Document Reviewed: 07/02/2011 [facility] Patient Information 2012 [place], [place].Bartholin's Cyst or Abscess Bartholin's glands are small glands located within the folds of skin (labia) along the sides of the lower opening of the vagina (birth canal). A cyst may develop when the duct of the gland becomes blocked. When this happens, fluid that accumulates within the cyst can become infected. This is known as an abscess. The Bartholin gland produces a mucous fluid to lubricate the outside of the vagina during sexual intercourse. SYMPTOMS   Patients with a small cyst may not have any symptoms.   Mild discomfort to severe pain depending on the size of the cyst and if it is infected (abscess).   Pain, redness, and swelling around the lower opening of the vagina.   Painful intercourse.   Pressure in the perineal area.   Swelling of the lips of the vagina (labia).   The cyst or abscess can be on one side or both sides of the vagina.  DIAGNOSIS   A large swelling is seen in the lower vagina area by your caregiver.   Painful to touch.   Redness and pain, if it is an abscess.  TREATMENT   Sometimes the cyst will go away on its own.   Apply warm wet compresses to the area or take hot sitz baths several times a day.   An incision to drain the cyst or abscess with local anesthesia.   Culture the pus, if it is an abscess.   Antibiotic treatment, if it is an abscess.   Cut open the gland and suture the edges to make the opening of the gland bigger (marsupialization).   Remove the whole gland if the cyst or abscess returns.  PREVENTION   Practice good hygiene.   Clean the vaginal area with a mild soap and soft cloth when  bathing.   Do not rub hard in the vaginal area when bathing.   Protect the crotch area with a padded cushion if you take long bike rides or ride horses.   Be sure you are well lubricated when you have sexual intercourse.  HOME CARE INSTRUCTIONS   If your cyst or abscess was opened, a  small piece of gauze, or a drain, may have been placed in the wound to allow drainage. Do not remove this gauze or drain unless directed by your caregiver.   Wear feminine pads, not tampons, as needed for any drainage or bleeding.   If antibiotics were prescribed, take them exactly as directed. Finish the entire course.   Only take over-the-counter or prescription medicines for pain, discomfort, or fever as directed by your caregiver.  SEEK IMMEDIATE MEDICAL CARE IF:   You have an increase in pain, redness, swelling, or drainage.   You have bleeding from the wound which results in the use of more than the number of pads suggested by your caregiver in 24 hours.   You have chills.   You have a fever.   You develop any new problems (symptoms) or aggravation of your existing condition.  MAKE SURE YOU:   Understand these instructions.   Will watch your condition.   Will get help right away if you are not doing well or get worse.  Document Released: 12/17/2005 Document Revised: 12/06/2011 Document Reviewed: 08/04/2008 [facility] Patient Information 2012 [place], [place].

## 2012-03-26 NOTE — Progress Notes (Signed)
[name] October 30, 1986 [id number]    History:    The patient presents for annual exam.  Monthly 4-7 day cycle, lesbian, not active 4 months, same partner for 5 years prior. Half a pack of cigarettes per day. Did not receive gardasil. Complained of a small grape-sized nontender lump on left labia present for 2-3 weeks. History of a normal Pap in the past.   Past medical history, past surgical history, family history and social history were all reviewed and documented in the EPIC chart. 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 27 year old sister lives with her full time. In school for criminal justice.   ROS:  A  ROS was performed and pertinent positives and negatives are included in the history.  Exam:  Filed Vitals:   03/26/12 1420  BP: 110/74    General appearance:  Normal Head/Neck:  Normal, without cervical or supraclavicular adenopathy. Thyroid:  Symmetrical, normal in size, without palpable masses or nodularity. Respiratory  Effort:  Normal  Auscultation:  Clear without wheezing or rhonchi Cardiovascular  Auscultation:  Regular rate, without rubs, murmurs or gallops  Edema/varicosities:  Not grossly evident Abdominal  Soft,nontender, without masses, guarding or rebound.  Liver/spleen:  No organomegaly noted  Hernia:  None appreciated  Skin  Inspection:  Grossly normal with many tattoos  Palpation:  Grossly normal Neurologic/psychiatric  Orientation:  Normal with appropriate conversation.  Mood/affect:  Normal  Genitourinary    Breasts: Examined lying and sitting.     Right: Without masses, retractions, discharge or axillary adenopathy.     Left: Without masses, retractions, discharge or axillary adenopathy.   Inguinal/mons:  Normal without inguinal adenopathy  External genitalia:  Normal  BUS/Urethra/Skene's glands:  Normal  Bladder:  Normal  Vagina:  2 cm nontender nonindurated Bartholin cyst on left   Cervix:  Normal  Uterus:  normal in size, shape and contour.  Midline  and mobile  Adnexa/parametria:     Rt: Without masses or tenderness.   Lt: Without masses or tenderness.  Anus and perineum: Normal  Digital rectal exam: Normal sphincter tone without palpated masses or tenderness  Assessment/Plan:  26 y.o. SWF G0 for annual exam.   Left Bartholin's cyst Smoker less than a half a pack per day  Plan: Reviewed Bartholin cyst does not appear to be indurated to be opened and drained. Will try doxycycline 100 twice a day for 10 days. Encouraged warm soaks, loose clothing. Instructed to call if area does not dissipate. Diflucan 150 by mouth x1 dose if needed for yeast symptoms. SBE's, exercise, calcium rich diet, MVI daily encouraged. Reviewed importance of no smoking for health, denies readiness to quit. CBC, glucose, UA and Pap.         Harrington Challenger [facility], 3:18 PM 03/26/2012

## 2012-03-27 LAB — URINALYSIS W MICROSCOPIC + REFLEX CULTURE
Bacteria, UA: NONE SEEN
Glucose, UA: NEGATIVE mg/dL
Hgb urine dipstick: NEGATIVE
Ketones, ur: NEGATIVE mg/dL
Leukocytes, UA: NEGATIVE
Nitrite: NEGATIVE
Protein, ur: NEGATIVE mg/dL
Specific Gravity, Urine: 1.026 (ref 1.005–1.030)
Urobilinogen, UA: 1 mg/dL (ref 0.0–1.0)
pH: 5.5 (ref 5.0–8.0)

## 2012-04-17 ENCOUNTER — Ambulatory Visit (INDEPENDENT_AMBULATORY_CARE_PROVIDER_SITE_OTHER): Payer: 59 | Admitting: Women's Health

## 2012-04-17 ENCOUNTER — Encounter: Payer: Self-pay | Admitting: Women's Health

## 2012-04-17 DIAGNOSIS — N750 Cyst of Bartholin's gland: Secondary | ICD-10-CM

## 2012-04-17 NOTE — Progress Notes (Signed)
Patient ID: [name], female   DOB: 24-Oct-1986, 26 y.o.   MRN: [id number] Presents for followup was noted to have a left  Bartholin's cyst an annual exam approximately 2 cm, treated with an antibiotic without relief. States is now more tender and uncomfortable. Denies any discharge, UTI symptoms or fever.  Exam: Left Bartholin's cyst approximately 3 cm, erythematous, tender.   Left Bartholin's cyst  Plan: Area cleansed with Betadine, 3 cc of plain Xylocaine to anesthetize, #11 scalpel, drained clear fluid, area flat, nontender after. Patient tolerated well, had good relief. Instructed to call or return if problems.

## 2012-05-29 ENCOUNTER — Encounter (HOSPITAL_COMMUNITY): Payer: Self-pay | Admitting: Emergency Medicine

## 2012-05-29 ENCOUNTER — Emergency Department (HOSPITAL_COMMUNITY)
Admission: EM | Admit: 2012-05-29 | Discharge: 2012-05-30 | Disposition: A | Discharge status: Home / Self Care | Payer: 59 | Attending: Emergency Medicine | Admitting: Emergency Medicine

## 2012-05-29 DIAGNOSIS — R531 Weakness: Secondary | ICD-10-CM

## 2012-05-29 DIAGNOSIS — F341 Dysthymic disorder: Secondary | ICD-10-CM | POA: Insufficient documentation

## 2012-05-29 DIAGNOSIS — R5381 Other malaise: Secondary | ICD-10-CM | POA: Insufficient documentation

## 2012-05-29 DIAGNOSIS — H532 Diplopia: Secondary | ICD-10-CM | POA: Insufficient documentation

## 2012-05-29 DIAGNOSIS — R5383 Other fatigue: Secondary | ICD-10-CM | POA: Insufficient documentation

## 2012-05-29 DIAGNOSIS — R209 Unspecified disturbances of skin sensation: Secondary | ICD-10-CM | POA: Insufficient documentation

## 2012-05-29 HISTORY — DX: Major depressive disorder, single episode, unspecified: F32.9

## 2012-05-29 HISTORY — DX: Depression, unspecified: F32.A

## 2012-05-29 HISTORY — DX: Anxiety disorder, unspecified: F41.9

## 2012-05-29 NOTE — ED Notes (Signed)
PT. REPORTS LEFT SIDE BODY  WEAKNESS WITH TWITCHINGS , LEFT EYE BLURRED VISION , SYNCOPAL EPISODES/ FALLS FOR SEVERAL DAYS .

## 2012-05-30 ENCOUNTER — Emergency Department (HOSPITAL_COMMUNITY): Payer: 59

## 2012-05-30 LAB — DIFFERENTIAL
Basophils Absolute: 0 10*3/uL (ref 0.0–0.1)
Eosinophils Absolute: 0.1 10*3/uL (ref 0.0–0.7)
Eosinophils Relative: 1 % (ref 0–5)
Lymphocytes Relative: 42 % (ref 12–46)
Monocytes Absolute: 0.5 10*3/uL (ref 0.1–1.0)

## 2012-05-30 LAB — CBC
HCT: 36.8 % (ref 36.0–46.0)
MCH: 32.8 pg (ref 26.0–34.0)
MCV: 97.4 fL (ref 78.0–100.0)
RDW: 12.8 % (ref 11.5–15.5)
WBC: 7.7 10*3/uL (ref 4.0–10.5)

## 2012-05-30 LAB — URINALYSIS, ROUTINE W REFLEX MICROSCOPIC
Glucose, UA: NEGATIVE mg/dL
Hgb urine dipstick: NEGATIVE
Ketones, ur: NEGATIVE mg/dL
Protein, ur: NEGATIVE mg/dL

## 2012-05-30 LAB — BASIC METABOLIC PANEL
CO2: 26 mEq/L (ref 19–32)
Calcium: 9.2 mg/dL (ref 8.4–10.5)
Creatinine, Ser: 0.75 mg/dL (ref 0.50–1.10)
Glucose, Bld: 76 mg/dL (ref 70–99)

## 2012-05-30 LAB — URINE MICROSCOPIC-ADD ON

## 2012-05-30 MED ORDER — POTASSIUM CHLORIDE CRYS ER 20 MEQ PO TBCR
40.0000 meq | EXTENDED_RELEASE_TABLET | Freq: Once | ORAL | Status: AC
  Administered 2012-05-30: 40 meq via ORAL
  Filled 2012-05-30 (×2): qty 2

## 2012-05-30 NOTE — ED Notes (Signed)
Pt experiencing pseudo-like jerkiness of left UE and face.  Jerkiness stopped when RN place hand on pts arm.

## 2012-05-30 NOTE — ED Provider Notes (Signed)
History     CSN: [id number]  Arrival date & time 05/29/12  2329   First MD Initiated Contact with Patient 05/30/12 0044      Chief Complaint  Patient presents with  . Weakness   HPI  History provided by the patient. Patient is a 26 year old female with history of anxiety and depression who presents with complaints of left-sided body weakness and double vision. Patient states that symptoms began last Monday one week ago with some tremor in the left arm and leg. Since that time patient complains of feeling some weakness in the left side with difficulty walking. Patient also reports having double vision from her left eye that is worse when she closes her right eye. Symptoms have stayed persistent since that time. Patient denies having any symptoms previously. She denies any aggravating or alleviating factors. Patient denies any new medications. She denies any headache, neck pain or back pains. She denies any vision loss, speech difficulty or confusion. She denies any loss of control of bowels or bladder. Patient does report a recent death in the family has made her feel depressed. She denies any SI/HI. Patient has no other complaints.    Past Medical History  Diagnosis Date  . Depression   . Anxiety   . Ovarian cyst     Past Surgical History  Procedure Date  . Oophorectomy 2007    RIGHT SIDE  . Appendectomy 2005    ALSO REMOVED LARGE CYST ON RIGHT OVARY  . Partial hysterectomy     Family History  Problem Relation Age of Onset  . Diabetes Mother   . Hyperlipidemia Mother   . Hypertension Maternal Grandmother   . Heart disease Maternal Grandmother   . Diabetes Maternal Grandfather   . Hypertension Maternal Grandfather   . Hyperlipidemia Maternal Grandfather   . Breast cancer Paternal Grandmother     History  Substance Use Topics  . Smoking status: Never Smoker   . Smokeless tobacco: Never Used  . Alcohol Use: No    OB History    Grav Para Term Preterm Abortions TAB  SAB Ect Mult Living   0               Review of Systems  Constitutional: Negative for fever and chills.  HENT: Negative for neck pain.   Eyes: Positive for visual disturbance.  Gastrointestinal: Negative for abdominal pain.  Musculoskeletal: Negative for back pain.  Neurological: Positive for weakness and numbness. Negative for seizures, speech difficulty and headaches.  Psychiatric/Behavioral: Negative for confusion.       Positive for depression, negative for SI/HI    Allergies  Codeine  Home Medications   Current Outpatient Rx  Name Route Sig Dispense Refill  . DESVENLAFAXINE SUCCINATE ER 50 MG PO TB24 Oral Take 100 mg by mouth daily.     Marland Kitchen DEXILANT 60 MG PO CPDR Oral Take 60 mg by mouth every morning.    Marland Kitchen DIAZEPAM 5 MG PO TABS Oral Take 5 mg by mouth 2 (two) times daily.    Marland Kitchen FEXOFENADINE HCL 60 MG PO TABS Oral Take 60 mg by mouth daily.    . MELOXICAM 7.5 MG PO TABS Oral Take 7.5 mg by mouth every morning.    . TRAZODONE HCL 50 MG PO TABS Oral Take 50 mg by mouth at bedtime.      BP 116/65  Pulse 76  Temp(Src) 98.5 F (36.9 C) (Oral)  Resp 18  SpO2 100%  LMP 05/07/2012  Physical Exam  Nursing note and vitals reviewed. Constitutional: She is oriented to person, place, and time. She appears well-developed and well-nourished. No distress.  HENT:  Head: Normocephalic.  Eyes: Conjunctivae and EOM are normal. Pupils are equal, round, and reactive to light.       No nystagmus  Neck: Normal range of motion. Neck supple.  Cardiovascular: Normal rate and regular rhythm.   Pulmonary/Chest: Effort normal and breath sounds normal. No respiratory distress. She has no wheezes. She has no rales.  Abdominal: Soft. There is no tenderness. There is no rebound and no guarding.  Neurological: She is alert and oriented to person, place, and time. No cranial nerve deficit or sensory deficit.       Patient has an irregular inconsistent gait with some shuffling of left foot.  Patient  has normal finger to nose coordination.  During strength testing patient has normal strength in the right upper and lower extremities. Patient at times has equal strength in left extremities during distraction but during instructed movements patient appears to give less effort. Patient can hold extremities against gravity.  Skin: Skin is warm and dry. No rash noted.  Psychiatric: She has a normal mood and affect. Her behavior is normal.    ED Course  Procedures   Results for orders placed during the hospital encounter of 05/29/12  CBC      Component Value Range   WBC 7.7  4.0 - 10.5 (K/uL)   RBC 3.78 (*) 3.87 - 5.11 (MIL/uL)   Hemoglobin 12.4  12.0 - 15.0 (g/dL)   HCT 16.1  09.6 - 04.5 (%)   MCV 97.4  78.0 - 100.0 (fL)   MCH 32.8  26.0 - 34.0 (pg)   MCHC 33.7  30.0 - 36.0 (g/dL)   RDW 40.9  81.1 - 91.4 (%)   Platelets 198  150 - 400 (K/uL)  DIFFERENTIAL      Component Value Range   Neutrophils Relative 51  43 - 77 (%)   Neutro Abs 4.0  1.7 - 7.7 (K/uL)   Lymphocytes Relative 42  12 - 46 (%)   Lymphs Abs 3.2  0.7 - 4.0 (K/uL)   Monocytes Relative 6  3 - 12 (%)   Monocytes Absolute 0.5  0.1 - 1.0 (K/uL)   Eosinophils Relative 1  0 - 5 (%)   Eosinophils Absolute 0.1  0.0 - 0.7 (K/uL)   Basophils Relative 0  0 - 1 (%)   Basophils Absolute 0.0  0.0 - 0.1 (K/uL)  BASIC METABOLIC PANEL      Component Value Range   Sodium 138  135 - 145 (mEq/L)   Potassium 3.4 (*) 3.5 - 5.1 (mEq/L)   Chloride 103  96 - 112 (mEq/L)   CO2 26  19 - 32 (mEq/L)   Glucose, Bld 76  70 - 99 (mg/dL)   BUN 11  6 - 23 (mg/dL)   Creatinine, Ser 7.82  0.50 - 1.10 (mg/dL)   Calcium 9.2  8.4 - 95.6 (mg/dL)   GFR calc non Af Amer >90  >90 (mL/min)   GFR calc Af Amer >90  >90 (mL/min)  URINALYSIS, ROUTINE W REFLEX MICROSCOPIC      Component Value Range   Color, Urine YELLOW  YELLOW    APPearance CLOUDY (*) CLEAR    Specific Gravity, Urine 1.010  1.005 - 1.030    pH 6.0  5.0 - 8.0    Glucose, UA NEGATIVE   NEGATIVE (mg/dL)   Hgb urine dipstick NEGATIVE  NEGATIVE    Bilirubin Urine NEGATIVE  NEGATIVE    Ketones, ur NEGATIVE  NEGATIVE (mg/dL)   Protein, ur NEGATIVE  NEGATIVE (mg/dL)   Urobilinogen, UA 0.2  0.0 - 1.0 (mg/dL)   Nitrite NEGATIVE  NEGATIVE    Leukocytes, UA SMALL (*) NEGATIVE   POCT PREGNANCY, URINE      Component Value Range   Preg Test, Ur NEGATIVE  NEGATIVE   URINE MICROSCOPIC-ADD ON      Component Value Range   Squamous Epithelial / LPF RARE  RARE    WBC, UA 3-6  <3 (WBC/hpf)   RBC / HPF 0-2  <3 (RBC/hpf)   Bacteria, UA FEW (*) RARE       Ct Head Wo Contrast  05/30/2012  *RADIOLOGY REPORT*  Clinical Data:  Left-sided weakness, leaning to the left, double vision  CT HEAD WITHOUT CONTRAST  Technique:  Contiguous axial images were obtained from the base of the skull through the vertex without contrast  Comparison:  None.  Findings:  The brain has a normal appearance without evidence for hemorrhage, acute infarction, hydrocephalus, or mass lesion.  There is no extra axial fluid collection.  The skull and paranasal sinuses are normal.  IMPRESSION: Normal CT of the head without contrast.  Original Report Authenticated By: Elsie Stain, M.D.     1. Weakness       MDM  Patient seen and evaluated. Patient no acute distress.    Pt discussed with Attending Physician.  Pt with inconsistent exam and does not seem to give effort.  CT and labs unremarkable.  Will plan to give Neurology referral.      Angus Seller, PA 05/30/12 [phone]

## 2012-05-30 NOTE — ED Provider Notes (Signed)
Date: 05/30/2012  Rate: 51  Rhythm: sinus bradycardia  QRS Axis: normal  Intervals: normal  ST/T Wave abnormalities: normal  Conduction Disutrbances:none  Narrative Interpretation:   Old EKG Reviewed: none available at time of interpretation  Pt seen walking around ED in no distress     Joya Gaskins, MD 05/30/12 0234

## 2012-05-30 NOTE — ED Provider Notes (Signed)
Medical screening examination/treatment/procedure(s) were performed by non-physician practitioner and as supervising physician I was immediately available for consultation/collaboration.   Joya Gaskins, MD 05/30/12 [phone]

## 2012-05-30 NOTE — ED Notes (Signed)
Pt states she is unable to keep left eye open.  Observed from nursing desk to have both eyes open and texting on phone.  While examining pt she was able to open both eyes for examine.  THroughout questioning period pt's left eye would be open but would close it when I purposely looked at her

## 2012-05-30 NOTE — Discharge Instructions (Signed)
You were seen and evaluated for your symptoms of weakness. At this time your providers have not found any signs for concerning or emergent cause your symptoms. Your CAT scan was normal of your head today. You have been given a referral to followup with a neurology specialist. Please call their office today to schedule a followup appointment. Return to emergency room for any worsening symptoms. Your providers feel you may return to work as normal.     Weakness, Generalized Without Cause Your caregiver has seen you today because you are having problems with feelings of weakness. Weakness has many different causes, some of which are common and others are very rare. The causes of weakness are so numerous they could not all be listed on this page. The exam and other tests done today do not reveal a specific cause for the weakness that is an immediate danger or something that is treatable. Your caregiver has checked you for the most common causes of weakness and feels it is safe for you to go home and be observed. HOME CARE INSTRUCTIONS   For the time being, obtain more rest if needed.   Eat a well balanced diet.   Try to get at least some exercise every day in spite of how difficult it may seem at times. In the case of the elderly, exercise is especially important. As we grow older, there is a loss of muscle mass. Generally, there is also a loss of, or decrease in, activity that comes naturally with the aging process. Exercise and increased activities are the only tools we have to combat this natural process.   The results of some tests ordered today may not be available right away. You will be contacted with those results when they become available.   It is important to follow through with your physician as per instructions that you may have received today.  SEEK MEDICAL CARE IF:   You have any new concerns which you do not feel were dealt with today.   The weakness seems to be getting  progressively worse.   You develop new or unusual aches or pains.  SEEK IMMEDIATE MEDICAL CARE IF:   You are unable to tend to your usual daily activities such as simply getting dressed, feeding yourself, or keeping up with your personal hygiene.   You develop inability to walk stairs or perform your usual daily activities.   You develop shortness of breath, chest pain, have difficulty moving parts of your body, or develop new problems for which you have not talked to your caregiver.   You experience difficulty speaking or swallowing.   You develop loss of control of bladder or bowels that was not present before.  Document Released: 12/17/2005 Document Revised: 12/06/2011 Document Reviewed: 05/29/2007 [facility] Patient Information 2012 [place], [place].   RESOURCE GUIDE  Chronic Pain Problems: Contact [facility]  [phone] Patients need to be referred by their primary care doctor.  Insufficient Money for Medicine: Contact United Way:  call "211" or [facility] (743) 742-0402.  No Primary Care Doctor: - Call [facility]  [phone] - can help you locate a primary care doctor that  accepts your insurance, provides certain services, etc. - Physician Referral Service- [phone]  Agencies that provide inexpensive medical care: - [facility]  [phone] - [facility]  [phone] - [facility]  [phone] - Women's Clinic  [phone] - Planned Parenthood  [phone] - [facility]  [phone]  Medicaid-accepting [facility] Providers: - [facility]- [address]  [phone], Mon-Fri 9am-7pm, Sat 9am-1pm - [facility]- [address] [place]  [phone] - [facility]- [address] [place]  [phone] [facility]- [address]  [phone] - Renaye Rakers- [address] [place], [address],  [phone]  Only accepts [place] patients after they have their name  applied to their card  Self Pay (no insurance) in [place]: - Sickle Cell Patients: Dr Willey Blade, [facility]  [address] [place], [phone] - [facility]- [address] [place]  [phone]       [facility] [place]- [address]       -     [facility]- see information above (Speak to [employer] if you do not have insurance)       -  [facility]- [address] [place], [phone]       -  [facility]- [address],  [phone]       -  [facility]- [address] [phone]       -  Dr Julio Sicks-  [address], [place], [phone]       -  [facility]- [address], [phone]       -  [facility]- [address] [phone], also [address] [phone]       -    [facility]- [address] [place], [phone], 1st & 3rd Saturday   every month, 10am-1pm  1) Find a Doctor and Pay Out of Pocket Although you won't have to find out who is covered by your insurance plan, it is a good idea to ask around and get recommendations. You will then need to call the office and see if the doctor you have chosen will accept you as a new patient and what types of options they offer for patients who are self-pay. Some doctors offer discounts or will set up payment plans for their patients who do not have insurance, but you will need to ask so you aren't surprised when you get to your appointment.  2) Contact Your Local Health Department Not all health departments have doctors that can see patients for sick visits, but many do, so it is worth a call to see if yours does. If you don't know where your local health department is, you can check in your phone book. The CDC also has a tool to help you locate your state's health department, and many state websites also have  listings of all of their local health departments.  3) Find a Walk-in Clinic If your illness is not likely to be very severe or complicated, you may want to try a walk in clinic. These are popping up all over the country in pharmacies, drugstores, and shopping centers. They're usually staffed by nurse practitioners or physician assistants that have been trained to treat common illnesses and complaints. They're usually fairly quick and inexpensive. However, if you have serious medical issues or chronic medical problems, these are probably not your best option  STD Testing - [facility] Department of [facility], STD Clinic, [address] [place], phone [phone] or [phone].  Monday - Friday,  call for an appointment. [facility] Department of [employer], STD Clinic, [address], [place], phone [phone] or [phone].  Monday - Friday, call for an appointment.  Abuse/Neglect: [facility] Child Abuse Hotline 641-879-3076 [facility] Child Abuse Hotline 414-868-7491 (After Hours)  Emergency Shelter:  [facility] 239-410-4853  Maternity Homes: - Room at the [place] [facility] (318) [phone] - [facility] (787) 001-4364  MRSA Hotline #:   224 029 6603  [facility] Resources  [facility]  United Way [facility] Dept. [address]                 [address]         [address] [place] [address]  [place] Reveal Phone:  [phone]                                  Phone:  [phone]                   Phone:  [phone]  [facility], [phone] - [phone]       -     [facility] in [place], [address],                                  [phone],  [facility] Child Abuse Hotline 507-283-6084 or 214-401-6815 (After Hours)   Behavioral Health Services  Substance Abuse Resources: - [facility] and Drug Services  561-580-9619 - [facility] (862)678-4677 - [facility] (878)134-9758 Floydene Flock 262-852-8479 - Residential & Outpatient Substance Abuse Program  (760)810-7446  Psychological Services: [facility]  8438582628 Services  7785982592 - [facility], [phone] [place]. [address], [place], ACCESS LINE: (651) 109-2647 or 724-304-6650, [employer]  Dental Assistance  If unable to pay or uninsured, contact:  [facility] or [facility] to become qualified for the adult dental clinic.  Patients with Medicaid: [facility] [phone] [address], [phone] [address] [phone]  If unable to pay, or uninsured, contact [facility] 306-778-7425) or [facility] Department [phone] in [place], [phone] in [facility]) to become qualified for the adult dental clinic  Other Low-Cost Community Dental Services: - [facility]- [address] [place], [place], [place], [address], [phone], [address], 2nd and 4th Thursday of the month at 6:30am.  10 clients each day by appointment, can sometimes see walk-in patients if someone does not show for an appointment. [facility]- [address] [place], [place], [address], [phone] - [facility] [address] [place], [place], [address], [phone] [facility] Health Department- [phone] [facility] Department- [phone] [facility] Department-  [phone]     

## 2012-05-30 NOTE — ED Notes (Signed)
Pt ambulated to BR without assistance.  Family at bedside.  No complaints offered by pt

## 2012-06-04 ENCOUNTER — Encounter: Payer: Self-pay | Admitting: Women's Health

## 2012-06-04 ENCOUNTER — Ambulatory Visit (INDEPENDENT_AMBULATORY_CARE_PROVIDER_SITE_OTHER): Payer: 59 | Admitting: Women's Health

## 2012-06-04 DIAGNOSIS — N750 Cyst of Bartholin's gland: Secondary | ICD-10-CM

## 2012-06-04 NOTE — Progress Notes (Signed)
Patient ID: [name], female   DOB: [birth year], 26 y.o.   MRN: [id number] Presents with the complaint of  Bartholin cyst recurrence. Cyst was noted 04/17/2012 drained clear fluid. States it has since refilled and is now slightly tender. Denies a fever or vaginal discharge.  Exam: Left Bartholin cyst noted approximately 4 cm.  Plan: Area cleansed with Betadine, anesthetized with 2% Xylocaine, opened with #11 blade, drained bloody discharge with no odor. Hemostat used to open. Word catheter placed filled with 3 cc. Patient tolerated well. While patient was sitting states felt a pop, catheter balloon popped and catheter removed. Second Ward catheter placed balloon filled with 3 cc, tolerated well. Instructed to return to the office in one week for removal of catheter. Is currently on amoxicillin for a tooth problem will finish out prescription. Encouraged tub bath/soak twice daily. Instructed to call if any fever or problems.

## 2012-06-04 NOTE — Patient Instructions (Signed)
Bartholin's Cyst and Abscess  Bartholin's glands produce mucus through small openings just outside the opening of the vagina. The mucus helps with lubrication around the vagina during sexual intercourse. If the duct becomes clogged, the gland will swell and cause a bulge on the inside of the vagina. If this becomes big enough, it can be seen and felt on the outside of the vagina as well. Sometimes, the swelling will shrink away by itself. However, if the cyst becomes infected, the Bartholin's cyst fills with pus and becomes more swollen, red and painful and becomes a Bartholin's abscess. This usually requires antibiotic treatment and surgical drainage. Sometimes, with minor surgery under local anesthesia, a small tube is placed in the cyst or abscess wall. This allows continued drainage for up to 6 weeks. Minor surgery can make a new opening to replace the clogged duct and help prevent future cysts or abscess.  If the abscess occurs several times, a minor operation with local anesthesia is necessary to remove the Bartholin's gland completely or to make it drain better. Cutting open the gland and suturing the edges to make the opening of the gland bigger (marsupialization) may be needed and should usually be done by your obstetrician-gyncology physician. Antibiotics are usually prescribed for this condition. Take all antibiotics as prescribed. Make sure to finish them even if you are doing better. Take warm sitz baths for 20 minutes, 3 times a day. See your caregiver for follow-up care as recommended.  SEEK MEDICAL CARE IF:    You have increasing pain, swelling, or redness near the vagina.   You have vomiting or inability to tolerate medicines.   You have a fever.   You have uncontrolled bleeding from the vagina.  Document Released: 01/24/2005 Document Revised: 12/06/2011 Document Reviewed: 01/27/2010  ExitCare Patient Information 2012 [employer].

## 2012-06-11 ENCOUNTER — Ambulatory Visit (INDEPENDENT_AMBULATORY_CARE_PROVIDER_SITE_OTHER): Payer: 59 | Admitting: Women's Health

## 2012-06-11 ENCOUNTER — Encounter: Payer: Self-pay | Admitting: Women's Health

## 2012-06-11 DIAGNOSIS — N750 Cyst of Bartholin's gland: Secondary | ICD-10-CM

## 2012-06-11 NOTE — Patient Instructions (Signed)
Bartholin's Cyst or Abscess Bartholin's glands are small glands located within the folds of skin (labia) along the sides of the lower opening of the vagina (birth canal). A cyst may develop when the duct of the gland becomes blocked. When this happens, fluid that accumulates within the cyst can become infected. This is known as an abscess. The Bartholin gland produces a mucous fluid to lubricate the outside of the vagina during sexual intercourse. SYMPTOMS   Patients with a small cyst may not have any symptoms.   Mild discomfort to severe pain depending on the size of the cyst and if it is infected (abscess).   Pain, redness, and swelling around the lower opening of the vagina.   Painful intercourse.   Pressure in the perineal area.   Swelling of the lips of the vagina (labia).   The cyst or abscess can be on one side or both sides of the vagina.  DIAGNOSIS   A large swelling is seen in the lower vagina area by your caregiver.   Painful to touch.   Redness and pain, if it is an abscess.  TREATMENT   Sometimes the cyst will go away on its own.   Apply warm wet compresses to the area or take hot sitz baths several times a day.   An incision to drain the cyst or abscess with local anesthesia.   Culture the pus, if it is an abscess.   Antibiotic treatment, if it is an abscess.   Cut open the gland and suture the edges to make the opening of the gland bigger (marsupialization).   Remove the whole gland if the cyst or abscess returns.  PREVENTION   Practice good hygiene.   Clean the vaginal area with a mild soap and soft cloth when bathing.   Do not rub hard in the vaginal area when bathing.   Protect the crotch area with a padded cushion if you take long bike rides or ride horses.   Be sure you are well lubricated when you have sexual intercourse.  HOME CARE INSTRUCTIONS   If your cyst or abscess was opened, a small piece of gauze, or a drain, may have been placed in  the wound to allow drainage. Do not remove this gauze or drain unless directed by your caregiver.   Wear feminine pads, not tampons, as needed for any drainage or bleeding.   If antibiotics were prescribed, take them exactly as directed. Finish the entire course.   Only take over-the-counter or prescription medicines for pain, discomfort, or fever as directed by your caregiver.  SEEK IMMEDIATE MEDICAL CARE IF:   You have an increase in pain, redness, swelling, or drainage.   You have bleeding from the wound which results in the use of more than the number of pads suggested by your caregiver in 24 hours.   You have chills.   You have a fever.   You develop any new problems (symptoms) or aggravation of your existing condition.  MAKE SURE YOU:   Understand these instructions.   Will watch your condition.   Will get help right away if you are not doing well or get worse.  Document Released: 12/17/2005 Document Revised: 12/06/2011 Document Reviewed: 08/04/2008 ExitCare Patient Information 2012 [employer]. 

## 2012-06-11 NOTE — Progress Notes (Signed)
Patient ID: [name], female   DOB: 11-04-1986, 26 y.o.   MRN: [id number] Presents for followup for Bartholin's cyst. Word catheter intact, denies discharge or pain.  Exam: Word catheter balloon deflated, 3 cc withdrawn, catheter removed intact and discarded, no swelling, erythema or tenderness noted.  Bartholin's cyst resolved  Plan: Reviewed if this would persist to be a problem, marsupialization is an option. Instructed to call if problems.

## 2012-07-02 ENCOUNTER — Ambulatory Visit (INDEPENDENT_AMBULATORY_CARE_PROVIDER_SITE_OTHER): Payer: 59 | Admitting: Gynecology

## 2012-07-02 ENCOUNTER — Encounter: Payer: Self-pay | Admitting: Gynecology

## 2012-07-02 VITALS — BP 110/70

## 2012-07-02 DIAGNOSIS — Z01818 Encounter for other preprocedural examination: Secondary | ICD-10-CM

## 2012-07-02 DIAGNOSIS — N750 Cyst of Bartholin's gland: Secondary | ICD-10-CM

## 2012-07-02 NOTE — Progress Notes (Signed)
[name] is an 26 y.o. female. Who presented to the office today with a complaint of recurrent left Bartholin duct cyst. This is the third time that this has occurred to this patient. She had had an incision and drainage before and also the second time had a Ward catheter put in for a week. Patient would like definitive treatment.  Patient with past prior surgical history:  Laparoscopic right ovarian cystectomy and appendectomy and her second operation she had a laparoscopic right salpingo-oophorectomy along with excision of hypoplastic right uterine horn  Same-sex relationship  Pertinent Gynecological History: Menses: Normal Bleeding: Normal menstrual cycle Contraception: none DES exposure: denies Blood transfusions: none Sexually transmitted diseases: no past history Previous GYN Procedures: Incision and drainage of Bartholin duct cysts x2  Last mammogram: Not indicated Date: Not indicated Last pap: normal Date: 2013 OB History: G 0, P 0   Menstrual History: Menarche age: 11 Patient's last menstrual period was 06/05/2012.    Past Medical History  Diagnosis Date  . Depression   . Anxiety   . Ovarian cyst     Past Surgical History  Procedure Date  . Oophorectomy 2007    RIGHT SIDE  . Appendectomy 2005    ALSO REMOVED LARGE CYST ON RIGHT OVARY  . Partial hysterectomy 2007    Hypoplastic right uterine horn excised with RSO    Family History  Problem Relation Age of Onset  . Diabetes Mother   . Hyperlipidemia Mother   . Hypertension Maternal Grandmother   . Heart disease Maternal Grandmother   . Diabetes Maternal Grandfather   . Hypertension Maternal Grandfather   . Hyperlipidemia Maternal Grandfather   . Breast cancer Paternal Grandmother     Social History:  reports that she has never smoked. She has never used smokeless tobacco. She reports that she does not drink alcohol or use illicit drugs.  Allergies:  Allergies  Allergen Reactions  . Codeine Nausea  And Vomiting     (Not in a hospital admission)  REVIEW OF SYSTEMS: A ROS was performed and pertinent positives and negatives are included in the history.  GENERAL: No fevers or chills. HEENT: No change in vision, no earache, sore throat or sinus congestion. NECK: No pain or stiffness. CARDIOVASCULAR: No chest pain or pressure. No palpitations. PULMONARY: No shortness of breath, cough or wheeze. GASTROINTESTINAL: No abdominal pain, nausea, vomiting or diarrhea, melena or bright red blood per rectum. GENITOURINARY: No urinary frequency, urgency, hesitancy or dysuria. MUSCULOSKELETAL: No joint or muscle pain, no back pain, no recent trauma. DERMATOLOGIC: No rash, no itching, no lesions. ENDOCRINE: No polyuria, polydipsia, no heat or cold intolerance. No recent change in weight. HEMATOLOGICAL: No anemia or easy bruising or bleeding. NEUROLOGIC: No headache, seizures, numbness, tingling or weakness. PSYCHIATRIC: No depression, no loss of interest in normal activity or change in sleep pattern.     Blood pressure 110/70, last menstrual period 06/05/2012.  Physical Exam:  HEENT:unremarkable Neck:Supple, midline, no thyroid megaly, no carotid bruits Lungs:  Clear to auscultation no rhonchi's or wheezes Heart:Regular rate and rhythm, no murmurs or gallops Breast Exam: Not examined Abdomen: Soft nontender no rebound or guarding Pelvic:BUS left Bartholin duct cyst 4 cm in diameter Vagina: No lesions or discharge Cervix: No lesions or discharge Uterus: Anteverted normal size shape and consistency Adnexa: No palpable masses or tenderness Extremities: No cords, no edema Rectal: Not examined   No results found.  Assessment/Plan: 26-year-old with recurrent left Bartholin duct cyst. Patient will be scheduled for   Bartholin duct gland marsupialization in the next 2 weeks as per her request because of scheduling. The following risk were discussed with the patient:                        Patient was  counseled as to the risk of surgery to include the following:  1. Infection (prohylactic antibiotics will be administered)  2. DVT/Pulmonary Embolism (prophylactic pneumo compression stockings will be used)  3.Trauma to internal organs requiring additional surgical procedure to repair any injury to     Internal organs requiring perhaps additional hospitalization days.  4.Hemmorhage requiring transfusion and blood products which carry risks such as             anaphylactic reaction, hepatitis and AIDS  Patient had received literature information on the procedure scheduled and all her questions were answered  and accepts all risk.  FERNANDEZ,JUAN HMD6:02 PMTD@   FERNANDEZ,JUAN H 07/02/2012, 5:55 PM   

## 2012-07-07 ENCOUNTER — Telehealth: Payer: Self-pay | Admitting: Gynecology

## 2012-07-07 NOTE — Telephone Encounter (Signed)
Left message with surgery date July 19 7:30am and asked patient to call me back and confirm if that date is good for her and let me give her some instructions.

## 2012-07-11 ENCOUNTER — Encounter (HOSPITAL_BASED_OUTPATIENT_CLINIC_OR_DEPARTMENT_OTHER): Payer: Self-pay | Admitting: *Deleted

## 2012-07-14 ENCOUNTER — Encounter (HOSPITAL_BASED_OUTPATIENT_CLINIC_OR_DEPARTMENT_OTHER): Payer: Self-pay | Admitting: *Deleted

## 2012-07-14 NOTE — Progress Notes (Signed)
Pt instructed npo p mn 7/18 x am meds w sip of water. To [facility] 7/19 @ 0615. Pt to come Wed 7/17 for cbc, u/a.  Needs urine hcg on arrival.

## 2012-07-16 LAB — URINALYSIS, ROUTINE W REFLEX MICROSCOPIC
Glucose, UA: NEGATIVE mg/dL
Leukocytes, UA: NEGATIVE
pH: 5.5 (ref 5.0–8.0)

## 2012-07-16 LAB — CBC
Hemoglobin: 12.7 g/dL (ref 12.0–15.0)
RBC: 3.88 MIL/uL (ref 3.87–5.11)

## 2012-07-16 MED ORDER — CEFAZOLIN SODIUM-DEXTROSE 2-3 GM-% IV SOLR: 2.0000 g | INTRAVENOUS | Status: AC

## 2012-07-18 ENCOUNTER — Encounter (HOSPITAL_BASED_OUTPATIENT_CLINIC_OR_DEPARTMENT_OTHER): Payer: Self-pay | Admitting: Anesthesiology

## 2012-07-18 ENCOUNTER — Ambulatory Visit (HOSPITAL_BASED_OUTPATIENT_CLINIC_OR_DEPARTMENT_OTHER)
Admission: RE | Admit: 2012-07-18 | Discharge: 2012-07-18 | Disposition: A | Discharge status: Home / Self Care | Payer: 59 | Source: Ambulatory Visit | Attending: Gynecology | Admitting: Gynecology

## 2012-07-18 ENCOUNTER — Encounter (HOSPITAL_BASED_OUTPATIENT_CLINIC_OR_DEPARTMENT_OTHER)
Admission: RE | Disposition: A | Discharge status: Home / Self Care | Payer: Self-pay | Source: Ambulatory Visit | Attending: Gynecology

## 2012-07-18 ENCOUNTER — Ambulatory Visit (HOSPITAL_BASED_OUTPATIENT_CLINIC_OR_DEPARTMENT_OTHER): Payer: 59 | Admitting: Anesthesiology

## 2012-07-18 ENCOUNTER — Encounter (HOSPITAL_BASED_OUTPATIENT_CLINIC_OR_DEPARTMENT_OTHER): Payer: Self-pay | Admitting: *Deleted

## 2012-07-18 DIAGNOSIS — N750 Cyst of Bartholin's gland: Secondary | ICD-10-CM | POA: Insufficient documentation

## 2012-07-18 HISTORY — DX: Personal history of other diseases of the female genital tract: Z87.42

## 2012-07-18 HISTORY — PX: BARTHOLIN CYST MARSUPIALIZATION: SHX5383

## 2012-07-18 HISTORY — DX: Cyst of Bartholin's gland: N75.0

## 2012-07-18 HISTORY — DX: Bicornate uterus: Q51.3

## 2012-07-18 HISTORY — DX: Gastro-esophageal reflux disease without esophagitis: K21.9

## 2012-07-18 LAB — POCT PREGNANCY, URINE: Preg Test, Ur: NEGATIVE

## 2012-07-18 SURGERY — MARSUPIALIZATION, CYST, BARTHOLIN'S GLAND
Anesthesia: General | Site: Perineum | Laterality: Left | Wound class: Clean Contaminated

## 2012-07-18 MED ORDER — LACTATED RINGERS IV SOLN
INTRAVENOUS | Status: DC
  Administered 2012-07-18: 100 mL/h via INTRAVENOUS

## 2012-07-18 MED ORDER — LACTATED RINGERS IV SOLN: INTRAVENOUS | Status: DC

## 2012-07-18 MED ORDER — OXYCODONE-ACETAMINOPHEN 5-500 MG PO CAPS: 1.0000 | ORAL_CAPSULE | ORAL | Status: AC | PRN

## 2012-07-18 MED ORDER — ONDANSETRON HCL 4 MG/2ML IJ SOLN
INTRAMUSCULAR | Status: DC | PRN
  Administered 2012-07-18: 4 mg via INTRAVENOUS

## 2012-07-18 MED ORDER — OXYCODONE-ACETAMINOPHEN 5-325 MG PO TABS
1.0000 | ORAL_TABLET | ORAL | Status: DC | PRN
  Administered 2012-07-18: 1 via ORAL

## 2012-07-18 MED ORDER — CEFAZOLIN SODIUM 1-5 GM-% IV SOLN
INTRAVENOUS | Status: DC | PRN
  Administered 2012-07-18: 1 g via INTRAVENOUS

## 2012-07-18 MED ORDER — PROPOFOL 10 MG/ML IV EMUL
INTRAVENOUS | Status: DC | PRN
  Administered 2012-07-18: 180 mg via INTRAVENOUS

## 2012-07-18 MED ORDER — PROMETHAZINE HCL 25 MG/ML IJ SOLN: 6.2500 mg | INTRAMUSCULAR | Status: DC | PRN

## 2012-07-18 MED ORDER — KETOROLAC TROMETHAMINE 30 MG/ML IJ SOLN
INTRAMUSCULAR | Status: DC | PRN
  Administered 2012-07-18: 30 mg via INTRAVENOUS

## 2012-07-18 MED ORDER — FENTANYL CITRATE 0.05 MG/ML IJ SOLN
INTRAMUSCULAR | Status: DC | PRN
  Administered 2012-07-18: 100 ug via INTRAVENOUS

## 2012-07-18 MED ORDER — CEFAZOLIN SODIUM 1-5 GM-% IV SOLN: 1.0000 g | Freq: Once | INTRAVENOUS | Status: DC

## 2012-07-18 MED ORDER — LACTATED RINGERS IV SOLN
INTRAVENOUS | Status: DC | PRN
  Administered 2012-07-18: 07:00:00 via INTRAVENOUS

## 2012-07-18 MED ORDER — LIDOCAINE HCL (CARDIAC) 20 MG/ML IV SOLN
INTRAVENOUS | Status: DC | PRN
  Administered 2012-07-18: 80 mg via INTRAVENOUS

## 2012-07-18 MED ORDER — FENTANYL CITRATE 0.05 MG/ML IJ SOLN
25.0000 ug | INTRAMUSCULAR | Status: DC | PRN
  Administered 2012-07-18: 25 ug via INTRAVENOUS

## 2012-07-18 MED ORDER — MIDAZOLAM HCL 5 MG/5ML IJ SOLN
INTRAMUSCULAR | Status: DC | PRN
  Administered 2012-07-18: 2 mg via INTRAVENOUS

## 2012-07-18 MED ORDER — DEXAMETHASONE SODIUM PHOSPHATE 4 MG/ML IJ SOLN
INTRAMUSCULAR | Status: DC | PRN
  Administered 2012-07-18: 10 mg via INTRAVENOUS

## 2012-07-18 SURGICAL SUPPLY — 41 items
APPLICATOR COTTON TIP 6IN STRL (MISCELLANEOUS) ×2 IMPLANT
BLADE SURG 15 STRL LF DISP TIS (BLADE) ×1 IMPLANT
BLADE SURG 15 STRL SS (BLADE) ×1
CANISTER SUCTION 2500CC (MISCELLANEOUS) ×2 IMPLANT
CATH ROBINSON RED A/P 14FR (CATHETERS) IMPLANT
CLOTH BEACON ORANGE TIMEOUT ST (SAFETY) ×2 IMPLANT
COVER TABLE BACK 60X90 (DRAPES) ×2 IMPLANT
DRAPE LG THREE QUARTER DISP (DRAPES) ×2 IMPLANT
DRAPE UNDERBUTTOCKS STRL (DRAPE) ×2 IMPLANT
ELECT NEEDLE TIP 2.8 STRL (NEEDLE) IMPLANT
ELECT REM PT RETURN 9FT ADLT (ELECTROSURGICAL) ×2
ELECTRODE REM PT RTRN 9FT ADLT (ELECTROSURGICAL) ×1 IMPLANT
GLOVE BIOGEL PI IND STRL 8 (GLOVE) ×1 IMPLANT
GLOVE BIOGEL PI INDICATOR 8 (GLOVE) ×1
GLOVE ECLIPSE 6.5 STRL STRAW (GLOVE) ×2 IMPLANT
GLOVE ECLIPSE 7.5 STRL STRAW (GLOVE) ×4 IMPLANT
GLOVE INDICATOR 6.5 STRL GRN (GLOVE) ×2 IMPLANT
GOWN PREVENTION PLUS LG XLONG (DISPOSABLE) ×2 IMPLANT
GOWN STRL REIN XL XLG (GOWN DISPOSABLE) ×2 IMPLANT
LEGGING LITHOTOMY PAIR STRL (DRAPES) ×2 IMPLANT
NEEDLE HYPO 22GX1.5 SAFETY (NEEDLE) ×2 IMPLANT
NS IRRIG 500ML POUR BTL (IV SOLUTION) IMPLANT
PACK BASIN DAY SURGERY FS (CUSTOM PROCEDURE TRAY) ×2 IMPLANT
PAD OB MATERNITY 4.3X12.25 (PERSONAL CARE ITEMS) ×2 IMPLANT
PAD PREP 24X48 CUFFED NSTRL (MISCELLANEOUS) ×2 IMPLANT
PENCIL BUTTON HOLSTER BLD 10FT (ELECTRODE) ×2 IMPLANT
SCOPETTES 8  STERILE (MISCELLANEOUS)
SCOPETTES 8 STERILE (MISCELLANEOUS) IMPLANT
SUT CHROMIC 3 0 PS 2 (SUTURE) ×2 IMPLANT
SUT CHROMIC 3 0 TIES (SUTURE) ×2 IMPLANT
SUT VIC AB 2-0 PS2 27 (SUTURE) IMPLANT
SUT VIC AB 3-0 FS2 27 (SUTURE) IMPLANT
SWAB CULTURE LIQ STUART DBL (MISCELLANEOUS) ×2 IMPLANT
SYR CONTROL 10ML LL (SYRINGE) IMPLANT
TOWEL OR 17X24 6PK STRL BLUE (TOWEL DISPOSABLE) ×4 IMPLANT
TRAY DSU PREP LF (CUSTOM PROCEDURE TRAY) ×2 IMPLANT
TUBE ANAEROBIC SPECIMEN COL (MISCELLANEOUS) ×2 IMPLANT
TUBE CONNECTING 12X1/4 (SUCTIONS) ×2 IMPLANT
VACUUM HOSE 7/8X10 W/ WAND (MISCELLANEOUS) IMPLANT
WATER STERILE IRR 500ML POUR (IV SOLUTION) ×2 IMPLANT
YANKAUER SUCT BULB TIP NO VENT (SUCTIONS) ×2 IMPLANT

## 2012-07-18 NOTE — H&P (View-Only) (Signed)
[name] is an 26 y.o. female. Who presented to the office today with a complaint of recurrent left Bartholin duct cyst. This is the third time that this has occurred to this patient. She had had an incision and drainage before and also the second time had a Ward catheter put in for a week. Patient would like definitive treatment.  Patient with past prior surgical history:  Laparoscopic right ovarian cystectomy and appendectomy and her second operation she had a laparoscopic right salpingo-oophorectomy along with excision of hypoplastic right uterine horn  Same-sex relationship  Pertinent Gynecological History: Menses: Normal Bleeding: Normal menstrual cycle Contraception: none DES exposure: denies Blood transfusions: none Sexually transmitted diseases: no past history Previous GYN Procedures: Incision and drainage of Bartholin duct cysts x2  Last mammogram: Not indicated Date: Not indicated Last pap: normal Date: 2013 OB History: G 0, P 0   Menstrual History: Menarche age: 11 Patient's last menstrual period was 06/05/2012.    Past Medical History  Diagnosis Date  . Depression   . Anxiety   . Ovarian cyst     Past Surgical History  Procedure Date  . Oophorectomy 2007    RIGHT SIDE  . Appendectomy 2005    ALSO REMOVED LARGE CYST ON RIGHT OVARY  . Partial hysterectomy 2007    Hypoplastic right uterine horn excised with RSO    Family History  Problem Relation Age of Onset  . Diabetes Mother   . Hyperlipidemia Mother   . Hypertension Maternal Grandmother   . Heart disease Maternal Grandmother   . Diabetes Maternal Grandfather   . Hypertension Maternal Grandfather   . Hyperlipidemia Maternal Grandfather   . Breast cancer Paternal Grandmother     Social History:  reports that she has never smoked. She has never used smokeless tobacco. She reports that she does not drink alcohol or use illicit drugs.  Allergies:  Allergies  Allergen Reactions  . Codeine Nausea  And Vomiting     (Not in a hospital admission)  REVIEW OF SYSTEMS: A ROS was performed and pertinent positives and negatives are included in the history.  GENERAL: No fevers or chills. HEENT: No change in vision, no earache, sore throat or sinus congestion. NECK: No pain or stiffness. CARDIOVASCULAR: No chest pain or pressure. No palpitations. PULMONARY: No shortness of breath, cough or wheeze. GASTROINTESTINAL: No abdominal pain, nausea, vomiting or diarrhea, melena or bright red blood per rectum. GENITOURINARY: No urinary frequency, urgency, hesitancy or dysuria. MUSCULOSKELETAL: No joint or muscle pain, no back pain, no recent trauma. DERMATOLOGIC: No rash, no itching, no lesions. ENDOCRINE: No polyuria, polydipsia, no heat or cold intolerance. No recent change in weight. HEMATOLOGICAL: No anemia or easy bruising or bleeding. NEUROLOGIC: No headache, seizures, numbness, tingling or weakness. PSYCHIATRIC: No depression, no loss of interest in normal activity or change in sleep pattern.     Blood pressure 110/70, last menstrual period 06/05/2012.  Physical Exam:  HEENT:unremarkable Neck:Supple, midline, no thyroid megaly, no carotid bruits Lungs:  Clear to auscultation no rhonchi's or wheezes Heart:Regular rate and rhythm, no murmurs or gallops Breast Exam: Not examined Abdomen: Soft nontender no rebound or guarding Pelvic:BUS left Bartholin duct cyst 4 cm in diameter Vagina: No lesions or discharge Cervix: No lesions or discharge Uterus: Anteverted normal size shape and consistency Adnexa: No palpable masses or tenderness Extremities: No cords, no edema Rectal: Not examined   No results found.  Assessment/Plan: 26 year old with recurrent left Bartholin duct cyst. Patient will be scheduled for  Bartholin duct gland marsupialization in the next 2 weeks as per her request because of scheduling. The following risk were discussed with the patient:                        Patient was  counseled as to the risk of surgery to include the following:  1. Infection (prohylactic antibiotics will be administered)  2. DVT/Pulmonary Embolism (prophylactic pneumo compression stockings will be used)  3.Trauma to internal organs requiring additional surgical procedure to repair any injury to     Internal organs requiring perhaps additional hospitalization days.  4.Hemmorhage requiring transfusion and blood products which carry risks such as             anaphylactic reaction, hepatitis and AIDS  Patient had received literature information on the procedure scheduled and all her questions were answered  and accepts all risk.  [facility] HMD6:02 PMTD@   Scott Fix H 07/02/2012, 5:55 PM

## 2012-07-18 NOTE — Anesthesia Procedure Notes (Signed)
Procedure Name: LMA Insertion Date/Time: 07/18/2012 7:39 AM Performed by: Jessica Priest Pre-anesthesia Checklist: Patient identified, Emergency Drugs available, Suction available and Patient being monitored Patient Re-evaluated:Patient Re-evaluated prior to inductionOxygen Delivery Method: Circle System Utilized Preoxygenation: Pre-oxygenation with 100% oxygen Intubation Type: IV induction Ventilation: Mask ventilation without difficulty LMA: LMA inserted LMA Size: 4.0 Number of attempts: 1 Airway Equipment and Method: bite block Placement Confirmation: positive ETCO2 Tube secured with: Tape Dental Injury: Teeth and Oropharynx as per pre-operative assessment

## 2012-07-18 NOTE — Anesthesia Postprocedure Evaluation (Signed)
  Immediate Anesthesia Transfer of Care Note  Patient: [name]  Procedure(s) Performed: Procedure(s) (LRB): BARTHOLIN CYST MARSUPIALIZATION (Left)  Patient Location: PACU  Anesthesia Type: General  Level of Consciousness: awake, sedated, patient cooperative and responds to stimulation  Airway & Oxygen Therapy: Patient Spontanous Breathing and Patient connected to face mask oxygen  Post-op Assessment: Report given to PACU RN, Post -op Vital signs reviewed and stable and Patient moving all extremities  Post vital signs: Reviewed and stable  Complications: No apparent anesthesia complications

## 2012-07-18 NOTE — Op Note (Signed)
07/18/2012  8:18 AM  PATIENT:  [name]  26 y.o. female  PRE-OPERATIVE DIAGNOSIS: Recurrent left Bartholin duct cyst  POST-OPERATIVE DIAGNOSIS:  Recurrent left Bartholin duct cyst PROCEDURE:  Procedure(s): BARTHOLIN CYST MARSUPIALIZATION (left)  SURGEON:  Surgeon(s): Ok Edwards, MD  ANESTHESIA:   general  FINDINGS: 4 cm in diameter left Bartholin duct cyst  DESCRIPTION OF OPERATION: The patient was taken to the operating room where she underwent successful general endotracheal anesthesia. She received a gram of Cefotan for prophylaxis and had PSA stockings for DVT prophylaxis. She was placed in high lithotomy position. The left Bartholin duct cyst was identified the air was cleansed with Betadine solution and on the medial aspect of the cyst towards the vagina a vertical incision was made and mucoid material extruded. Aerobic and anaerobic cultures were obtained. The area was copiously irrigated with normal saline solution. The Bartholin duct cyst walls were secured to the surrounding mucosa with interrupted sutures of 3-0 Vicryl suture. Neosporin was applied to the area. The patient was extubated and transferred to recovery room stable vital signs. Blood loss was minimal. Patient was given 30 mg of Toradol in route to the recovery room. Total IV fluid was 800 cc of lactated Ringer's.  ESTIMATED BLOOD LOSS: * No blood loss amount entered *  No intake or output data in the 24 hours ending 07/18/12 0818   BLOOD ADMINISTERED:none   LOCAL MEDICATIONS USED:  NONE  SPECIMEN:  Source of Specimen:  Aerobic and anaerobic cultures  DISPOSITION OF SPECIMEN:  N/A  COUNTS:  YES  PLAN OF CARE: Transfer to PACU  [facility] HMD8:18 AMTD@

## 2012-07-18 NOTE — Transfer of Care (Signed)
Immediate Anesthesia Transfer of Care Note  Patient: [name]  Procedure(s) Performed: Procedure(s) (LRB): BARTHOLIN CYST MARSUPIALIZATION (Left)  Patient Location: PACU  Anesthesia Type: General  Level of Consciousness: awake, sedated, patient cooperative and responds to stimulation  Airway & Oxygen Therapy: Patient Spontanous Breathing and Patient connected to face mask oxygen  Post-op Assessment: Report given to PACU RN, Post -op Vital signs reviewed and stable and Patient moving all extremities  Post vital signs: Reviewed and stable  Complications: No apparent anesthesia complications

## 2012-07-18 NOTE — Anesthesia Preprocedure Evaluation (Signed)
Anesthesia Evaluation  Patient identified by MRN, date of birth, ID band Patient awake    Reviewed: Allergy & Precautions, H&P , NPO status , Patient's Chart, lab work & pertinent test results  Airway Mallampati: II TM Distance: >3 FB Neck ROM: Full    Dental  (+) Teeth Intact and Dental Advisory Given   Pulmonary Current Smoker,  breath sounds clear to auscultation  Pulmonary exam normal       Cardiovascular negative cardio ROS  Rhythm:Regular Rate:Normal     Neuro/Psych Anxiety Depression PTSDnegative neurological ROS     GI/Hepatic Neg liver ROS, GERD-  Medicated and Controlled,  Endo/Other  negative endocrine ROS  Renal/GU negative Renal ROS  negative genitourinary   Musculoskeletal negative musculoskeletal ROS (+)   Abdominal   Peds  Hematology negative hematology ROS (+)   Anesthesia Other Findings   Reproductive/Obstetrics negative OB ROS                           Anesthesia Physical Anesthesia Plan  ASA: II  Anesthesia Plan: General   Post-op Pain Management:    Induction: Intravenous  Airway Management Planned: LMA  Additional Equipment:   Intra-op Plan:   Post-operative Plan: Extubation in OR  Informed Consent: I have reviewed the patients History and Physical, chart, labs and discussed the procedure including the risks, benefits and alternatives for the proposed anesthesia with the patient or authorized representative who has indicated his/her understanding and acceptance.   Dental advisory given  Plan Discussed with: CRNA  Anesthesia Plan Comments:         Anesthesia Quick Evaluation

## 2012-07-18 NOTE — Anesthesia Postprocedure Evaluation (Signed)
Anesthesia Post Note  Patient: [name]  Procedure(s) Performed: Procedure(s) (LRB): BARTHOLIN CYST MARSUPIALIZATION (Left)  Anesthesia type: General  Patient location: PACU  Post pain: Pain level controlled  Post assessment: Post-op Vital signs reviewed  Last Vitals:  Filed Vitals:   07/18/12 0900  BP: 111/58  Pulse: 83  Temp:   Resp: 15    Post vital signs: Reviewed  Level of consciousness: sedated  Complications: No apparent anesthesia complications

## 2012-07-18 NOTE — Interval H&P Note (Signed)
History and Physical Interval Note:  07/18/2012 7:17 AM  [name]  has presented today for surgery, with the diagnosis of bartholin cyst  The various methods of treatment have been discussed with the patient and family. After consideration of risks, benefits and other options for treatment, the patient has consented to  Procedure(s) (LRB): BARTHOLIN CYST MARSUPIALIZATION (Left) as a surgical intervention .  The patient's history has been reviewed, patient examined, no change in status, stable for surgery.  I have reviewed the patient's chart and labs.  Questions were answered to the patient's satisfaction.     Ok Edwards

## 2012-07-21 ENCOUNTER — Encounter (HOSPITAL_BASED_OUTPATIENT_CLINIC_OR_DEPARTMENT_OTHER): Payer: Self-pay | Admitting: Gynecology

## 2012-07-21 LAB — BODY FLUID CULTURE: Culture: NO GROWTH

## 2012-07-23 LAB — ANAEROBIC CULTURE: Gram Stain: NONE SEEN

## 2012-08-04 ENCOUNTER — Ambulatory Visit (INDEPENDENT_AMBULATORY_CARE_PROVIDER_SITE_OTHER): Payer: 59 | Admitting: Gynecology

## 2012-08-04 ENCOUNTER — Encounter: Payer: Self-pay | Admitting: Gynecology

## 2012-08-04 VITALS — BP 112/70

## 2012-08-04 DIAGNOSIS — Z9889 Other specified postprocedural states: Secondary | ICD-10-CM

## 2012-08-04 NOTE — Progress Notes (Signed)
Patient is a 26 year old who presented to the office today for her two-week postop visit. Patient status post left Bartholin gland marsupialization as a result a recurrent left Bartholin duct cyst. She is doing well results of her culture were negative from her surgery.  Exam Bartholin urethra Skene glands: Area of left Bartholin duct gland marsupialization with good granulation tissue present. Suture was removed. The area was cleansed with Betadine solution Neosporin was placed. She will continue to do sitz bath for a couple more weeks. Otherwise she scheduled to return back to the office next year for her annual exam or when necessary.

## 2012-08-21 ENCOUNTER — Encounter: Payer: Self-pay | Admitting: Gynecology

## 2012-08-21 ENCOUNTER — Ambulatory Visit (INDEPENDENT_AMBULATORY_CARE_PROVIDER_SITE_OTHER): Payer: 59 | Admitting: Gynecology

## 2012-08-21 VITALS — BP 112/70

## 2012-08-21 DIAGNOSIS — N750 Cyst of Bartholin's gland: Secondary | ICD-10-CM

## 2012-08-21 NOTE — Progress Notes (Signed)
Patient is a 26 year old who presented to the office today thinking that her left Bartholin duct cyst hasn't returned. Review of her record indicated in July 19 she had undergone a left Bartholin gland marsupialization as a result of recurrent Bartholin duct cyst. She had been seen the office on August 5 for 2 weeks postop appointment and was doing well in the area was healing well.  Exam: Incision site an area where the marsupialization had been undertaken close completely she'll. A small fluctuant area below with the incision was made is now present. Nonerythematous and nontender.  Assessment/plan: Questionable relapse of left Bartholin duct cyst status post left Bartholin duct marsupialization July 2013. We'll try conservative approach with sitz baths daily and reassess in 4 weeks or sooner no improvement or symptomatic.

## 2012-09-19 ENCOUNTER — Encounter (HOSPITAL_COMMUNITY): Payer: Self-pay | Admitting: Emergency Medicine

## 2012-09-19 ENCOUNTER — Emergency Department (HOSPITAL_COMMUNITY)
Admission: EM | Admit: 2012-09-19 | Discharge: 2012-09-19 | Disposition: A | Discharge status: Home / Self Care | Payer: Self-pay | Attending: Emergency Medicine | Admitting: Emergency Medicine

## 2012-09-19 ENCOUNTER — Emergency Department (HOSPITAL_COMMUNITY): Payer: Self-pay

## 2012-09-19 DIAGNOSIS — N751 Abscess of Bartholin's gland: Secondary | ICD-10-CM | POA: Insufficient documentation

## 2012-09-19 DIAGNOSIS — F329 Major depressive disorder, single episode, unspecified: Secondary | ICD-10-CM | POA: Insufficient documentation

## 2012-09-19 DIAGNOSIS — K219 Gastro-esophageal reflux disease without esophagitis: Secondary | ICD-10-CM | POA: Insufficient documentation

## 2012-09-19 DIAGNOSIS — F3289 Other specified depressive episodes: Secondary | ICD-10-CM | POA: Insufficient documentation

## 2012-09-19 DIAGNOSIS — M546 Pain in thoracic spine: Secondary | ICD-10-CM | POA: Insufficient documentation

## 2012-09-19 DIAGNOSIS — F411 Generalized anxiety disorder: Secondary | ICD-10-CM | POA: Insufficient documentation

## 2012-09-19 DIAGNOSIS — M549 Dorsalgia, unspecified: Secondary | ICD-10-CM

## 2012-09-19 DIAGNOSIS — F17200 Nicotine dependence, unspecified, uncomplicated: Secondary | ICD-10-CM | POA: Insufficient documentation

## 2012-09-19 DIAGNOSIS — N759 Disease of Bartholin's gland, unspecified: Secondary | ICD-10-CM

## 2012-09-19 MED ORDER — PERCOCET 5-325 MG PO TABS: 1.0000 | ORAL_TABLET | Freq: Four times a day (QID) | ORAL | Status: DC | PRN

## 2012-09-19 NOTE — ED Provider Notes (Signed)
History/physical exam/procedure(s) were performed by non-physician practitioner and as supervising physician I was immediately available for consultation/collaboration. I have reviewed all notes and am in agreement with care and plan.   Dariyon Urquilla S Toula Miyasaki, MD 09/19/12 1508 

## 2012-09-19 NOTE — ED Notes (Signed)
Pt report upper back pain between shoulder blades. States it hurts to be touched in that area. States that she thinks she's got something on her spine and doesn't feel the pain is muscular skeletal in origin. Pt also complaining of perineal pain. States that she has recurrent Bartolin Cysts in her perineal region. States she had surgery to remove one in July but another has appeared. Does not want to go back to previous doctor to follow up abt the cyst.

## 2012-09-19 NOTE — ED Provider Notes (Signed)
History     CSN: [id number]  Arrival date & time 09/19/12  1234   First MD Initiated Contact with Patient 09/19/12 1256      Chief Complaint  Patient presents with  . Back Pain  . Groin Swelling    (Consider location/radiation/quality/duration/timing/severity/associated sxs/prior treatment) HPI Comments: Pt to ER with multiple cc. New onset of thoracic back pain and questionable relapse of left Bartholin duct cyst status post left Bartholin duct marsupialization July 2013 (dr. Lily Peer). Pt was advised to try conservative approach with sitz baths daily and reassess in 4 weeks or sooner no improvement or symptomatic. She presents to ER bc no improvement. In regards to back pain pt denies, numbness and tingling of extremities, fevers, NS, chills, trauma, IV drug use, CA hx, loss control of bowel or bladder.       Patient is a 26 y.o. female presenting with back pain. The history is provided by the patient.  Back Pain  Pertinent negatives include no fever, no headaches and no dysuria.    Past Medical History  Diagnosis Date  . Depression   . Anxiety   . History of ovarian cyst   . Bartholin cyst LEFT SIDE  . Bicornuate uterus S/P EXCISION RIGHT UTERUS HORN 2007  . GERD (gastroesophageal reflux disease)     Past Surgical History  Procedure Date  . Laparoscopic appendectomy and right ovarian cystectomy 09-04-2004  . Laparoscopic excision hypoplastic right uterine horn and right salpingoophectomy 03-18-2006  . Bartholin cyst marsupialization 07/18/2012    Procedure: BARTHOLIN CYST MARSUPIALIZATION;  Surgeon: Ok Edwards, MD;  Location: [facility];  Service: Gynecology;  Laterality: Left;  Left Bartholin Duct Marsupilization. One hour.  Thanks!    Family History  Problem Relation Age of Onset  . Diabetes Mother   . Hyperlipidemia Mother   . Hypertension Maternal Grandmother   . Heart disease Maternal Grandmother   . Diabetes Maternal Grandfather   .  Hypertension Maternal Grandfather   . Hyperlipidemia Maternal Grandfather   . Breast cancer Paternal Grandmother     History  Substance Use Topics  . Smoking status: Current Every Day Smoker -- 0.5 packs/day    Types: Cigarettes  . Smokeless tobacco: Never Used  . Alcohol Use: No    OB History    Grav Para Term Preterm Abortions TAB SAB Ect Mult Living   0               Review of Systems  Constitutional: Negative for fever, diaphoresis and activity change.  HENT: Negative for congestion and neck pain.   Respiratory: Negative for cough.   Genitourinary: Positive for genital sores. Negative for dysuria.  Musculoskeletal: Positive for back pain. Negative for myalgias.  Skin: Negative for color change and wound.  Neurological: Negative for headaches.  All other systems reviewed and are negative.    Allergies  Codeine  Home Medications   Current Outpatient Rx  Name Route Sig Dispense Refill  . DESVENLAFAXINE SUCCINATE ER 50 MG PO TB24 Oral Take 100 mg by mouth daily.     Marland Kitchen DEXILANT 60 MG PO CPDR Oral Take 60 mg by mouth every morning.    Marland Kitchen DIAZEPAM 5 MG PO TABS Oral Take 5 mg by mouth every 8 (eight) hours as needed. For anxiety    . FEXOFENADINE HCL 60 MG PO TABS Oral Take 60 mg by mouth daily.    . QUETIAPINE FUMARATE ER 200 MG PO TB24 Oral Take 200 mg by mouth  at bedtime.    . TRAZODONE HCL 50 MG PO TABS Oral Take 50 mg by mouth at bedtime.      BP 109/61  Pulse 100  Temp 98.1 F (36.7 C) (Oral)  Resp 18  SpO2 100%  LMP 08/27/2012  Physical Exam  Nursing note and vitals reviewed. Constitutional: She appears well-developed and well-nourished. No distress.  HENT:  Head: Normocephalic and atraumatic.  Eyes: Conjunctivae normal and EOM are normal.  Neck: Normal range of motion. Neck supple.  Cardiovascular:       Intact distal pulses, capillary refill < 3 seconds  Genitourinary:          3 cm round area of fluctuance located near labia minor.     Musculoskeletal:       Thoracic back: She exhibits bony tenderness, swelling and pain. She exhibits normal range of motion.       All other extremities with normal ROM  Neurological:       No sensory deficit  Skin: She is not diaphoretic.       Skin intact, no tenting    ED Course  INCISION AND DRAINAGE Date/Time: 09/19/2012 2:31 PM Performed by: Jaci Carrel Authorized by: Jaci Carrel Consent: Verbal consent obtained. Risks and benefits: risks, benefits and alternatives were discussed Consent given by: patient Patient understanding: patient states understanding of the procedure being performed Patient consent: the patient's understanding of the procedure matches consent given Patient identity confirmed: verbally with patient and arm band Time out: Immediately prior to procedure a "time out" was called to verify the correct patient, procedure, equipment, support staff and site/side marked as required. Type: seroma Location: left bartholin area. Anesthesia: local infiltration Local anesthetic: lidocaine 1% without epinephrine Anesthetic total: 1.5 ml Patient sedated: no Scalpel size: 11 Drainage: serous Drainage amount: copious Wound treatment: wound left open Packing material: none Patient tolerance: Patient tolerated the procedure well with no immediate complications.   (including critical care time)  Labs Reviewed - No data to display No results found.   No diagnosis found.    MDM  Back pain, I & D  Pt with repeat swelling to bartholin area s/p marsupialization. I & D consistent with serous drainage. Pt advised follow up in 2 days with her OBGYN, Dr. Lily Peer.  No co-morbidities that would retard wound healing. Strict return precautions discussed. Patient also with back pain.  No neurological deficits and normal neuro exam.  Patient can walk but states is painful.  No loss of bowel or bladder control.  No concern for cauda equina.  No fever, night sweats, weight  loss, h/o cancer, IVDU.  RICE protocol and pain medicine indicated and discussed with patient.          Jaci Carrel, [place] 09/19/12 1439

## 2012-09-19 NOTE — ED Notes (Signed)
PA at bedside.

## 2012-09-19 NOTE — ED Notes (Signed)
Pt presenting to ed with c/o mid-back pain. Pt states she did not injure her back. Pt denies any problems with urination or bowel problems. Pt states she also has a vaginal abscess that she had surgery on previously.

## 2013-07-22 DIAGNOSIS — G8929 Other chronic pain: Secondary | ICD-10-CM | POA: Insufficient documentation

## 2013-07-22 DIAGNOSIS — F4310 Post-traumatic stress disorder, unspecified: Secondary | ICD-10-CM | POA: Insufficient documentation

## 2013-07-22 DIAGNOSIS — R102 Pelvic and perineal pain: Secondary | ICD-10-CM | POA: Insufficient documentation

## 2014-10-02 ENCOUNTER — Emergency Department (HOSPITAL_COMMUNITY)
Admission: EM | Admit: 2014-10-02 | Discharge: 2014-10-02 | Disposition: A | Discharge status: Home / Self Care | Payer: 59 | Attending: Emergency Medicine | Admitting: Emergency Medicine

## 2014-10-02 ENCOUNTER — Encounter (HOSPITAL_COMMUNITY): Payer: Self-pay | Admitting: Emergency Medicine

## 2014-10-02 DIAGNOSIS — K219 Gastro-esophageal reflux disease without esophagitis: Secondary | ICD-10-CM | POA: Insufficient documentation

## 2014-10-02 DIAGNOSIS — R0981 Nasal congestion: Secondary | ICD-10-CM | POA: Insufficient documentation

## 2014-10-02 DIAGNOSIS — F419 Anxiety disorder, unspecified: Secondary | ICD-10-CM | POA: Insufficient documentation

## 2014-10-02 DIAGNOSIS — N751 Abscess of Bartholin's gland: Secondary | ICD-10-CM | POA: Insufficient documentation

## 2014-10-02 DIAGNOSIS — Z79899 Other long term (current) drug therapy: Secondary | ICD-10-CM | POA: Insufficient documentation

## 2014-10-02 DIAGNOSIS — R0789 Other chest pain: Secondary | ICD-10-CM | POA: Insufficient documentation

## 2014-10-02 DIAGNOSIS — Z720 Tobacco use: Secondary | ICD-10-CM | POA: Insufficient documentation

## 2014-10-02 DIAGNOSIS — F329 Major depressive disorder, single episode, unspecified: Secondary | ICD-10-CM | POA: Insufficient documentation

## 2014-10-02 MED ORDER — HYDROCODONE-ACETAMINOPHEN 5-325 MG PO TABS: 1.0000 | ORAL_TABLET | Freq: Four times a day (QID) | ORAL | Status: DC | PRN

## 2014-10-02 MED ORDER — LIDOCAINE HCL (PF) 1 % IJ SOLN
5.0000 mL | Freq: Once | INTRAMUSCULAR | Status: DC
  Filled 2014-10-02: qty 5

## 2014-10-02 NOTE — ED Provider Notes (Signed)
Medical screening examination/treatment/procedure(s) were conducted as a shared visit with non-physician practitioner(s) or resident  and myself.  I personally evaluated the patient during the encounter and agree with the findings.   I have personally reviewed any xrays and/ or EKG's with the provider and I agree with interpretation.   Patient with gradually worsening pain and swelling to left vaginal area, similar to multiple previous abscess ease. Patient's had procedures done by OB/GYN the past. Patient denies fevers or systemic symptoms. See physician assistant exam further details, incision and drainage done an emergency department followup with OB/GYN discussed. Patient overall well-appearing on my exam, abdomen soft nontender no guarding.  Bartholin's abscess  Enid Skeens, MD 10/02/14 1550

## 2014-10-02 NOTE — ED Notes (Signed)
Pt presents to department for evaluation of possible vaginal abscess. Ongoing for several weeks. 9/10 pain upon arrival. Pt is alert and oriented x4.

## 2014-10-02 NOTE — ED Provider Notes (Signed)
CSN: [id number]     Arrival date & time 10/02/14  1250 History   First MD Initiated Contact with Patient 10/02/14 1408     Chief Complaint  Patient presents with  . Abscess   Patient is a 28 y.o. female presenting with abscess.  Abscess Associated symptoms: no fatigue, no fever, no nausea and no vomiting     Patient is a 28 y.o. Female who presents to the ED with left cyst in the vaginal area.  Patient states that she has had a lump there for the past two weeks and last night it became hot red and warm to the touch and soft.  Patient has had bartholin gland cysts repeatedly and has had a marsupialization of the left bartholins gland.  She has had a recurrence of the cysts multiple times.     Past Medical History  Diagnosis Date  . Depression   . Anxiety   . History of ovarian cyst   . Bartholin cyst LEFT SIDE  . Bicornuate uterus S/P EXCISION RIGHT UTERUS HORN 2007  . GERD (gastroesophageal reflux disease)    Past Surgical History  Procedure Laterality Date  . Laparoscopic appendectomy and right ovarian cystectomy  09-04-2004  . Laparoscopic excision hypoplastic right uterine horn and right salpingoophectomy  03-18-2006  . Bartholin cyst marsupialization  07/18/2012    Procedure: BARTHOLIN CYST MARSUPIALIZATION;  Surgeon: Ok Edwards, MD;  Location: [facility];  Service: Gynecology;  Laterality: Left;  Left Bartholin Duct Marsupilization. One hour.  Thanks!   Family History  Problem Relation Age of Onset  . Diabetes Mother   . Hyperlipidemia Mother   . Hypertension Maternal Grandmother   . Heart disease Maternal Grandmother   . Diabetes Maternal Grandfather   . Hypertension Maternal Grandfather   . Hyperlipidemia Maternal Grandfather   . Breast cancer Paternal Grandmother    History  Substance Use Topics  . Smoking status: Current Every Day Smoker -- 0.50 packs/day    Types: Cigarettes  . Smokeless tobacco: Never Used  . Alcohol Use: No   OB  History   Grav Para Term Preterm Abortions TAB SAB Ect Mult Living   0              Review of Systems  Constitutional: Negative for fever, chills and fatigue.  HENT: Positive for congestion and rhinorrhea.   Respiratory: Positive for cough. Negative for choking and shortness of breath.   Cardiovascular: Negative for chest pain and palpitations.  Gastrointestinal: Negative for nausea, vomiting, diarrhea and constipation.  Genitourinary: Positive for vaginal pain. Negative for dysuria, urgency, frequency, hematuria and difficulty urinating.  All other systems reviewed and are negative.     Allergies  Codeine  Home Medications   Prior to Admission medications   Medication Sig Start Date End Date Taking? Authorizing Provider  buPROPion (WELLBUTRIN XL) 150 MG 24 hr tablet Take 150 mg by mouth daily.   Yes Historical Provider, MD  cetirizine (ZYRTEC) 10 MG tablet Take 10 mg by mouth daily.   Yes Historical Provider, MD  desvenlafaxine (PRISTIQ) 50 MG 24 hr tablet Take 50 mg by mouth daily.    Yes Historical Provider, MD  diazepam (VALIUM) 5 MG tablet Take 5 mg by mouth every 8 (eight) hours as needed. For anxiety   Yes Historical Provider, MD  omeprazole (PRILOSEC) 20 MG capsule Take 20 mg by mouth daily.   Yes Historical Provider, MD  traZODone (DESYREL) 100 MG tablet Take 100 mg by mouth  at bedtime.   Yes Historical Provider, MD  HYDROcodone-acetaminophen (NORCO/VICODIN) 5-325 MG per tablet Take 1 tablet by mouth every 6 (six) hours as needed for moderate pain or severe pain. 10/02/14   Islah Eve A Forcucci, PA-C   BP 110/63  Pulse 78  Temp(Src) 98.2 F (36.8 C) (Oral)  Resp 15  SpO2 100% Physical Exam  Nursing note and vitals reviewed. Constitutional: She is oriented to person, place, and time. She appears well-developed and well-nourished. No distress.  HENT:  Head: Normocephalic and atraumatic.  Mouth/Throat: Oropharynx is clear and moist. No oropharyngeal exudate.  Eyes:  Conjunctivae and EOM are normal. Pupils are equal, round, and reactive to light. No scleral icterus.  Neck: Normal range of motion. Neck supple. No JVD present. No thyromegaly present.  Cardiovascular: Normal rate, regular rhythm, normal heart sounds and intact distal pulses.  Exam reveals no gallop and no friction rub.   No murmur heard. Pulmonary/Chest: Effort normal. No respiratory distress. She has no wheezes. She has rhonchi (clears with coughing) in the right lower field and the left lower field. She has no rales. She exhibits no tenderness.  Abdominal: Soft. Bowel sounds are normal. She exhibits no distension and no mass. There is no tenderness. There is no rebound and no guarding.  Genitourinary:     Lymphadenopathy:    She has no cervical adenopathy.  Neurological: She is alert and oriented to person, place, and time.  Skin: Skin is warm and dry. She is not diaphoretic.  Psychiatric: She has a normal mood and affect. Her behavior is normal. Judgment and thought content normal.    ED Course  INCISION AND DRAINAGE Date/Time: 10/02/2014 3:38 PM Performed by: Terri Piedra A Authorized by: Terri Piedra A Consent: Verbal consent obtained. Risks and benefits: risks, benefits and alternatives were discussed Consent given by: patient Patient understanding: patient states understanding of the procedure being performed Patient consent: the patient's understanding of the procedure matches consent given Procedure consent: procedure consent matches procedure scheduled Relevant documents: relevant documents present and verified Test results: test results available and properly labeled Site marked: the operative site was marked Imaging studies: imaging studies available Patient identity confirmed: verbally with patient Time out: Immediately prior to procedure a "time out" was called to verify the correct patient, procedure, equipment, support staff and site/side marked as  required. Type: abscess Body area: anogenital Location details: Bartholin's gland Anesthesia: local infiltration Local anesthetic: lidocaine 1% without epinephrine Anesthetic total: 5 ml Patient sedated: no Scalpel size: 10 Incision type: single straight Complexity: complex Drainage: serosanguinous Drainage amount: moderate Wound treatment: drain placed (Word drain placed) Patient tolerance: Patient tolerated the procedure well with no immediate complications.   (including critical care time) Labs Review Labs Reviewed - No data to display  Imaging Review No results found.   EKG Interpretation None      MDM   Final diagnoses:  Bartholin's gland abscess   Patient is a 28 y.o. Female who presents to the ED with left sided bartholins gland abscess.  There was no surrounding erythema.  Abscess was drained as seen above.  Patient also has cough which is productive of yellow green sputum.  Patient has declined a workup for her cough at this time.  Will have the patient follow-up with [facility] outpatient for wound check in two days.  Patient does not have any surrounding erythema at this time or evidence of cellulitis.  Will not place on antibiotics at this time.  Patient to return for  signs of infection including fever, chills, nausea, vomiting and streaking.  Patient to return for shortness of breath or worsening cough.  Patient is stable for discharge at this time.  Patient was discussed with Dr. Jodi MourningZavitz who has also seen the patient and he agrees with the above workup and plan.  Patient states understanding and agreement at this time.      Eben Burowourtney A Forcucci, PA-C 10/02/14 1545

## 2014-10-02 NOTE — ED Notes (Signed)
Pt from home for possible vaginal abscess, pt states hx of same and had surgery to remove glands but states that she is starting to have pain again in same area. Pt denies any vaginal discharge or odor. States abscess is painful and causes dysuria. nad noted. Pt denies any n/v/d or fevers.

## 2014-10-02 NOTE — Discharge Instructions (Signed)
Bartholin's Cyst or Abscess Bartholin's glands are small glands located within the folds of skin (labia) along the sides of the lower opening of the vagina (birth canal). A cyst may develop when the duct of the gland becomes blocked. When this happens, fluid that accumulates within the cyst can become infected. This is known as an abscess. The Bartholin gland produces a mucous fluid to lubricate the outside of the vagina during sexual intercourse. SYMPTOMS   Patients with a small cyst may not have any symptoms.  Mild discomfort to severe pain depending on the size of the cyst and if it is infected (abscess).  Pain, redness, and swelling around the lower opening of the vagina.  Painful intercourse.  Pressure in the perineal area.  Swelling of the lips of the vagina (labia).  The cyst or abscess can be on one side or both sides of the vagina. DIAGNOSIS   A large swelling is seen in the lower vagina area by your caregiver.  Painful to touch.  Redness and pain, if it is an abscess. TREATMENT   Sometimes the cyst will go away on its own.  Apply warm wet compresses to the area or take hot sitz baths several times a day.  An incision to drain the cyst or abscess with local anesthesia.  Culture the pus, if it is an abscess.  Antibiotic treatment, if it is an abscess.  Cut open the gland and suture the edges to make the opening of the gland bigger (marsupialization).  Remove the whole gland if the cyst or abscess returns. PREVENTION   Practice good hygiene.  Clean the vaginal area with a mild soap and soft cloth when bathing.  Do not rub hard in the vaginal area when bathing.  Protect the crotch area with a padded cushion if you take long bike rides or ride horses.  Be sure you are well lubricated when you have sexual intercourse. HOME CARE INSTRUCTIONS   If your cyst or abscess was opened, a small piece of gauze, or a drain, may have been placed in the wound to allow  drainage. Do not remove this gauze or drain unless directed by your caregiver.  Wear feminine pads, not tampons, as needed for any drainage or bleeding.  If antibiotics were prescribed, take them exactly as directed. Finish the entire course.  Only take over-the-counter or prescription medicines for pain, discomfort, or fever as directed by your caregiver. SEEK IMMEDIATE MEDICAL CARE IF:   You have an increase in pain, redness, swelling, or drainage.  You have bleeding from the wound which results in the use of more than the number of pads suggested by your caregiver in 24 hours.  You have chills.  You have a fever.  You develop any new problems (symptoms) or aggravation of your existing condition. MAKE SURE YOU:   Understand these instructions.  Will watch your condition.  Will get help right away if you are not doing well or get worse. Document Released: 12/17/2005 Document Revised: 03/10/2012 Document Reviewed: 08/04/2008 [facility] Patient Information 2015 [place], [place]. This information is not intended to replace advice given to you by your health care provider. Make sure you discuss any questions you have with your health care provider.   Emergency Department Resource Guide 1) Find a Doctor and Pay Out of Pocket Although you won't have to find out who is covered by your insurance plan, it is a good idea to ask around and get recommendations. You will then need to call  the office and see if the doctor you have chosen will accept you as a new patient and what types of options they offer for patients who are self-pay. Some doctors offer discounts or will set up payment plans for their patients who do not have insurance, but you will need to ask so you aren't surprised when you get to your appointment.  2) Contact Your Local Health Department Not all health departments have doctors that can see patients for sick visits, but many do, so it is worth a call to see if yours does. If  you don't know where your local health department is, you can check in your phone book. The CDC also has a tool to help you locate your state's health department, and many state websites also have listings of all of their local health departments.  3) Find a Walk-in Clinic If your illness is not likely to be very severe or complicated, you may want to try a walk in clinic. These are popping up all over the country in pharmacies, drugstores, and shopping centers. They're usually staffed by nurse practitioners or physician assistants that have been trained to treat common illnesses and complaints. They're usually fairly quick and inexpensive. However, if you have serious medical issues or chronic medical problems, these are probably not your best option.  No Primary Care Doctor: - Call Health Connect at  [phone] - they can help you locate a primary care doctor that  accepts your insurance, provides certain services, etc. - Physician Referral Service- [phone]  Chronic Pain Problems: Organization         Address  Phone   Notes  [facility]  [phone]) [phone] Patients need to be referred by their primary care doctor.   Medication Assistance: Organization         Address  Phone   Notes  [facility] [address] [place], [place] [address] [phone]) [phone] --Must be a resident of [place] -- Must have NO insurance coverage whatsoever (no Medicaid/ Medicare, etc.) -- The pt. MUST have a primary care doctor that directs their care regularly and follows them in the community   MedAssist  [phone]   [employer]  [phone]    Agencies that provide inexpensive medical care: Organization         Address  Phone   Notes  [facility]  [phone]   [facility]    [phone]   [facility] [address] [place], [place] [address] [phone]   [facility] [place] [address], [place] [phone]   Planned Parenthood    [phone]   [facility]    [phone]   [facility]  [address], [place] Phone:  [phone]) [phone], Fax:  [phone]) [phone] Hours of Operation:  9 am - 6 pm, M-F.  Also accepts Medicaid/Medicare and self-pay.  [facility]  [address], [place] Phone: [phone]) [phone], Fax: [phone]. Hours of Operation:  8:30 am - 5:30 pm, M-F.  Also accepts Medicaid and self-pay.  [facility] [place] [address], [place] Phone: [phone](336) [phone]   [facility] [address], [place], [place] [phone], Ext. [phone] Mondays & Thursdays: 7-9 AM.  First 15 patients are seen on a first come, first serve basis.  Medicaid-accepting [facility] Providers:  Organization         Address  Phone   Notes  [facility] [address], [place] [phone] Also accepts self-pay patients.  [facility] [address] Laurell Josephs [place], [place]  [phone]   [facility] [address], [place] [phone]   [facility] [address] [place] [phone]   Renaye Rakers [address], [place]   [phone] Only accepts [place] Access [place] patients after they have their name applied to their card.   Self-Pay (no insurance) in [facility]:  Organization         Address  Phone   Notes  Sickle Cell Patients, [facility] Medicine [address] [place], [place] [phone]   [facility] [address] [place], [place] [phone]   [facility]  [address], [place] [phone]   [facility]/Dr. Osei-Bonsu  [address], [place] or [address], [place] [phone]  Phone number for both [place] and [place] locations is the same.  [facility] and [facility] [address] [facility] [phone]   [facility] [address] [place] or [address] [phone]   [facility] [address] [employer] [phone], phone; [phone], fax Sees patients 1st and 3rd Saturday of every month.  Must not qualify for public or private insurance (i.e. Medicaid, Medicare, [place] Health Choice, Veterans' Benefits)  Household income should be no more than 200% of the poverty level The clinic cannot treat you if you are pregnant or think you are pregnant  Sexually transmitted diseases are not treated at the clinic.    Dental Care: Organization         Address  Phone  Notes  [facility] Department of [facility] [place] [address] [place], [place] [phone] Accepts children up to age 36 who are enrolled in [place] or [place] Health Choice; pregnant women with a Medicaid card; and children who have applied for Medicaid or [place] Health Choice, but were declined, whose parents can pay a reduced fee at time of service.  [facility] of [facility]  [address], [place] [phone] Accepts children up to age 53 who are enrolled in [place] or [place] Health Choice; pregnant women with a Medicaid card; and children who have applied for Medicaid or [place] Health Choice, but were declined, whose parents can pay a reduced fee at time of service.  [facility]  [address] [place], [place] [phone] Patients are seen by appointment only. Walk-ins are not accepted. [facility] will see patients 31 years of age and older. Monday - Tuesday (8am-5pm) Most Wednesdays (8:30-5pm) $30 per visit, cash only  [facility]  [address], [facility] [phone] Patients are seen by appointment  only. Walk-ins are not accepted. [facility] will see patients 27 years of age and older. One Wednesday Evening (Monthly: Volunteer Based).  $30 per visit, cash only  [employer]  [phone] for adults; Children under age 46, call Graduate Pediatric Dentistry at [phone]. Children aged 22-14, please call [phone] to request a  pediatric application.  Dental services are provided in all areas of dental care including fillings, crowns and bridges, complete and partial dentures, implants, gum treatment, root canals, and extractions. Preventive care is also provided. Treatment is provided to both adults and children. Patients are selected via a lottery and there is often a waiting list.   [facility] [address], [place]  [phone] [email or web address]   [facility] [address] [place], [place] [phone], Ext. [phone] Second and Fourth Thursday of each month, opens at 6:30 AM; Clinic ends at 9 AM.  Patients are seen on a first-come first-served basis, and a limited number are seen during each clinic.   [facility]  [address] Ether Griffins [place], [place] [phone]   Eligibility Requirements You must have lived in [place], [place], or [place] counties for at least the last three months.   You cannot be eligible for state or federal sponsored [place], including [employer], [place], or [employer].   You generally cannot be eligible for healthcare insurance through your employer.    How to apply: Eligibility screenings are held every Tuesday and Wednesday afternoon from 1:00 pm until 4:00 pm. You do not need an appointment for the interview!  [facility] [address] [place], [place] [phone]   [facility] Health Department  [phone]   [facility] Health Department  [phone]   [facility] Health Department  [phone]    Behavioral Health  Resources in the Community: Intensive Outpatient Programs Organization         Address  Phone  Notes  [facility] Services [address], [place], [place] [phone]   [facility] Outpatient [address] [place], [place] [phone]   [facility]: [facility] [address], [place], [place]  [phone]   [facility] Mental Health [address]  [place], [place] [phone] or [phone]   Substance Abuse Resources Organization         Address  Phone  Notes  [facility] Drug Services  [phone]   [facility]  [phone]   [facility] [place]  [phone]   Floydene Flock  [phone]   Residential & Outpatient Substance Abuse Program  [phone]   Psychological Services Organization         Address  Phone  Notes  [facility] Behavioral Health  [phone]   [facility] Services  [phone]   [facility] Mental Health [address], [place] [phone] or [phone]    Mobile Crisis Teams Organization         Address  Phone  Notes  [facility], Mobile Crisis Care Unit  [phone]   [facility]  [address] [place], [place] [phone]   Doristine Locks [address] [place] [phone]    Self-Help/Support Groups Organization         Address  Phone             Notes  [facility] [place] - variety of support groups  [phone]- [phone] Call for more information  Narcotics Anonymous (NA), [facility] [address], [employer] [place]  2 meetings at this location   Statistician         Address  Phone  Notes  [facility] [address],    [place]  [phone]   [facility]  [address], [place] [address],  [place], [place] [phone]   [facility] [address], [place] [phone] Admissions: 8am-3pm M-F  [facility]  [facility] [address]    [place], [place] [phone]   [facility] [address], [place], [place] [phone]   [facility] [address]  [place], [place] [phone]   [facility] - Intensive Outpatient [address], [place], [place] [phone]   [facility] ([facility] [address]  [place], [place] [phone] or [phone]   [facility] ([facility]) [address] [place], [place] [phone] Accepts Medicaid  [facility] [address]  [place] [phone] Substance Abuse/Addiction Treatment   [facility] Behavioral Health Resources Organization         Address  Phone  Notes  [facility]  [phone]) [phone]   Angie FavaJulie Brannon, PhD [address] [place], [place]   [phone](336) [phone] or [phone]   [facility]   [address] [place], [place] [phone](336) [phone]   [facility] [address], [place], [place] [phone]) [phone] Insurance/Medicaid/sponsorship through [facility]  [facility] Families [address]                                    [place], [place] [phone]) [phone] Therapy/tele-psych/case  [facility] [address]   [place], [place] [phone](336) [phone]    Dr. Lolly MustacheArfeen  [phone]) [phone]   [facility]  [employer] [facility] 1) [address], [place] 2) [address], [place] 3)  [address], [place] [phone](336) [phone]  [phone](336) [phone]   [facility] Child Abuse Hotline 3013690836(336) [phone] or [phone]) [phone] (After Hours)

## 2014-11-12 ENCOUNTER — Encounter (HOSPITAL_COMMUNITY): Payer: Self-pay | Admitting: *Deleted

## 2014-11-12 ENCOUNTER — Emergency Department (HOSPITAL_COMMUNITY)
Admission: EM | Admit: 2014-11-12 | Discharge: 2014-11-13 | Disposition: A | Discharge status: Home / Self Care | Payer: Self-pay | Attending: Emergency Medicine | Admitting: Emergency Medicine

## 2014-11-12 DIAGNOSIS — R509 Fever, unspecified: Secondary | ICD-10-CM | POA: Insufficient documentation

## 2014-11-12 DIAGNOSIS — F419 Anxiety disorder, unspecified: Secondary | ICD-10-CM | POA: Insufficient documentation

## 2014-11-12 DIAGNOSIS — Z8719 Personal history of other diseases of the digestive system: Secondary | ICD-10-CM | POA: Insufficient documentation

## 2014-11-12 DIAGNOSIS — Z8742 Personal history of other diseases of the female genital tract: Secondary | ICD-10-CM | POA: Insufficient documentation

## 2014-11-12 DIAGNOSIS — Z720 Tobacco use: Secondary | ICD-10-CM | POA: Insufficient documentation

## 2014-11-12 DIAGNOSIS — F329 Major depressive disorder, single episode, unspecified: Secondary | ICD-10-CM | POA: Insufficient documentation

## 2014-11-12 DIAGNOSIS — Z79899 Other long term (current) drug therapy: Secondary | ICD-10-CM | POA: Insufficient documentation

## 2014-11-12 DIAGNOSIS — R1110 Vomiting, unspecified: Secondary | ICD-10-CM | POA: Insufficient documentation

## 2014-11-12 DIAGNOSIS — R1032 Left lower quadrant pain: Secondary | ICD-10-CM | POA: Insufficient documentation

## 2014-11-12 DIAGNOSIS — Z87718 Personal history of other specified (corrected) congenital malformations of genitourinary system: Secondary | ICD-10-CM | POA: Insufficient documentation

## 2014-11-12 LAB — CBC WITH DIFFERENTIAL/PLATELET
BASOS ABS: 0 10*3/uL (ref 0.0–0.1)
BASOS PCT: 0 % (ref 0–1)
EOS ABS: 0.1 10*3/uL (ref 0.0–0.7)
EOS PCT: 1 % (ref 0–5)
HEMATOCRIT: 40.2 % (ref 36.0–46.0)
Hemoglobin: 13.5 g/dL (ref 12.0–15.0)
LYMPHS PCT: 35 % (ref 12–46)
Lymphs Abs: 3.2 10*3/uL (ref 0.7–4.0)
MCH: 31.7 pg (ref 26.0–34.0)
MCHC: 33.6 g/dL (ref 30.0–36.0)
MCV: 94.4 fL (ref 78.0–100.0)
MONO ABS: 0.5 10*3/uL (ref 0.1–1.0)
Monocytes Relative: 6 % (ref 3–12)
Neutro Abs: 5.3 10*3/uL (ref 1.7–7.7)
Neutrophils Relative %: 58 % (ref 43–77)
PLATELETS: 213 10*3/uL (ref 150–400)
RBC: 4.26 MIL/uL (ref 3.87–5.11)
RDW: 13.3 % (ref 11.5–15.5)
WBC: 9.1 10*3/uL (ref 4.0–10.5)

## 2014-11-12 LAB — URINALYSIS, ROUTINE W REFLEX MICROSCOPIC
Bilirubin Urine: NEGATIVE
Glucose, UA: NEGATIVE mg/dL
HGB URINE DIPSTICK: NEGATIVE
KETONES UR: NEGATIVE mg/dL
Leukocytes, UA: NEGATIVE
Nitrite: NEGATIVE
PROTEIN: NEGATIVE mg/dL
Specific Gravity, Urine: 1.005 (ref 1.005–1.030)
UROBILINOGEN UA: 0.2 mg/dL (ref 0.0–1.0)
pH: 6 (ref 5.0–8.0)

## 2014-11-12 LAB — BASIC METABOLIC PANEL
Anion gap: 14 (ref 5–15)
BUN: 6 mg/dL (ref 6–23)
CALCIUM: 9.4 mg/dL (ref 8.4–10.5)
CO2: 24 meq/L (ref 19–32)
CREATININE: 0.75 mg/dL (ref 0.50–1.10)
Chloride: 100 mEq/L (ref 96–112)
GFR calc Af Amer: >90 mL/min (ref >90)
GLUCOSE: 87 mg/dL (ref 70–99)
Potassium: 3.7 mEq/L (ref 3.7–5.3)
SODIUM: 138 meq/L (ref 137–147)

## 2014-11-12 LAB — I-STAT BETA HCG BLOOD, ED (MC, WL, AP ONLY)

## 2014-11-12 NOTE — ED Provider Notes (Signed)
CSN: [id number]     Arrival date & time 11/12/14  2102 History   First MD Initiated Contact with Patient 11/12/14 2350     Chief Complaint  Patient presents with  . Abdominal Pain  . Emesis     (Consider location/radiation/quality/duration/timing/severity/associated sxs/prior Treatment) HPI   [name] is a 28 y.o. female complaining of Left lower quadrant pain intermittently onset 5 days ago significantly worsening over the last day. Patient had several episodes of emesis when the pain was severe earlier in the day. Patient reports associated symptom of fever (MAXIMUM TEMPERATURE 103.4 several hours ago) took ibuprofen 400 mg 1.5 hours ago. Rates her pain at 8 out of 10, described as sharp and aching, no exacerbating or alleviating factors identified. States pain is typical to prior ovarian cysts, but it normally resolves quicker. She had bloody vaginal discharge several days ago which is now resolved. She denies diarrhea, change in urination,cough, rash, cervicalgia or headache. Patient's last menstrual period was on October 21.  Past Medical History  Diagnosis Date  . Depression   . Anxiety   . History of ovarian cyst   . Bartholin cyst LEFT SIDE  . Bicornuate uterus S/P EXCISION RIGHT UTERUS HORN 2007  . GERD (gastroesophageal reflux disease)    Past Surgical History  Procedure Laterality Date  . Laparoscopic appendectomy and right ovarian cystectomy  09-04-2004  . Laparoscopic excision hypoplastic right uterine horn and right salpingoophectomy  03-18-2006  . Bartholin cyst marsupialization  07/18/2012    Procedure: BARTHOLIN CYST MARSUPIALIZATION;  Surgeon: Ok Edwards, MD;  [facility];  Service: Gynecology;  Laterality: Left;  Left Bartholin Duct Marsupilization. One hour.  Thanks!   Family History  Problem Relation Age of Onset  . Diabetes Mother   . Hyperlipidemia Mother   . Hypertension Maternal Grandmother   . Heart disease Maternal  Grandmother   . Diabetes Maternal Grandfather   . Hypertension Maternal Grandfather   . Hyperlipidemia Maternal Grandfather   . Breast cancer Paternal Grandmother    History  Substance Use Topics  . Smoking status: Current Every Day Smoker -- 0.50 packs/day    Types: Cigarettes  . Smokeless tobacco: Never Used  . Alcohol Use: No   OB History    Gravida Para Term Preterm AB TAB SAB Ectopic Multiple Living   0              Review of Systems    Allergies  Codeine  Home Medications   Prior to Admission medications   Medication Sig Start Date End Date Taking? Authorizing Provider  ALPRAZolam Prudy Feeler) 1 MG tablet Take 1 mg by mouth 2 (two) times daily as needed for anxiety.   Yes Historical Provider, MD  buPROPion (WELLBUTRIN XL) 150 MG 24 hr tablet Take 150 mg by mouth daily.   Yes Historical Provider, MD  cetirizine (ZYRTEC) 10 MG tablet Take 10 mg by mouth daily.   Yes Historical Provider, MD  desvenlafaxine (PRISTIQ) 50 MG 24 hr tablet Take 100 mg by mouth daily.    Yes Historical Provider, MD  diazepam (VALIUM) 5 MG tablet Take 10 mg by mouth every 8 (eight) hours as needed. For anxiety   Yes Historical Provider, MD  traZODone (DESYREL) 100 MG tablet Take 100 mg by mouth at bedtime.   Yes Historical Provider, MD  HYDROcodone-acetaminophen (NORCO/VICODIN) 5-325 MG per tablet Take 1 tablet by mouth every 6 (six) hours as needed for moderate pain or severe pain. 10/02/14  Courtney A Forcucci, PA-C   BP 106/54 mmHg  Pulse 73  Temp(Src) 98.6 F (37 C) (Oral)  Resp 16  Ht 5\' 5"  (1.651 m)  Wt 155 lb 4.8 oz (70.444 kg)  BMI 25.84 kg/m2  SpO2 99%  LMP 10/19/2014 Physical Exam  Constitutional: She is oriented to person, place, and time. She appears well-developed and well-nourished. No distress.  HENT:  Head: Normocephalic and atraumatic.  Mouth/Throat: Oropharynx is clear and moist.  Eyes: Conjunctivae and EOM are normal.  Neck: Normal range of motion.  Cardiovascular:  Normal rate, regular rhythm and intact distal pulses.   Pulmonary/Chest: Effort normal and breath sounds normal. No stridor. No respiratory distress. She has no wheezes. She has no rales. She exhibits no tenderness.  Abdominal: Soft.  Mild Tenderness to deep palpation of the left lower quadrant with no guarding or rebound.  Genitourinary: Vagina normal.  Pelvic exam is chaperoned by technician:  No rashes or lesions, no abnormal vaginal discharge, no cervical motion tenderness. There is mild left adnexal tenderness palpation.  Musculoskeletal: Normal range of motion.  Neurological: She is alert and oriented to person, place, and time.  Psychiatric: She has a normal mood and affect.  Nursing note and vitals reviewed.   ED Course  Procedures (including critical care time) Labs Review Labs Reviewed  WET PREP, GENITAL  GC/CHLAMYDIA PROBE AMP  URINALYSIS, ROUTINE W REFLEX MICROSCOPIC  BASIC METABOLIC PANEL  CBC WITH DIFFERENTIAL  I-STAT BETA HCG BLOOD, ED ([facility], [facility], [facility] ONLY)    Imaging Review No results found.   EKG Interpretation None      MDM   Final diagnoses:  LLQ pain    Filed Vitals:   11/12/14 2137  BP: 106/54  Pulse: 73  Temp: 98.6 F (37 C)  TempSrc: Oral  Resp: 16  Height: 5\' 5"  (1.651 m)  Weight: 155 lb 4.8 oz (70.444 kg)  SpO2: 99%    Medications  ondansetron (ZOFRAN-ODT) disintegrating tablet 4 mg (not administered)  HYDROcodone-acetaminophen (NORCO/VICODIN) 5-325 MG per tablet 1 tablet (not administered)    [name] is a 28 y.o. female presenting with left lower quadrant pain and several episodes of emesis. Patient reports high fever of 103 prior to arrival. Abdominal exam is benign with very mild tenderness palpation of the left lower quadrant. Pelvic exam with no significant abnormality. Wet prep is negative. Case signed out to PA Piepenbrink at shift change. Plan is to follow-up pelvic ultrasound and refer to women's hospital.      Wynetta EmeryNicole  Allanah Mcfarland, PA-C 11/13/14 2003  Loren Raceravid Yelverton, MD 11/14/14 [phone]

## 2014-11-12 NOTE — ED Notes (Addendum)
C/o abd pain and nv. Onset Tuesday. H/o similar. Relates to ovarian cysts. Pain not easing off as it usually does. Last emesis ~1 hr ago. Admits to fever earlier 103.4. Took ibuprofen 1.5 hrs ago (400mg ), (denies: diarrhea or dizziness). Last ate 1300. LMP 10/19/14. Reports cysts ruptured last Tuesday.

## 2014-11-13 ENCOUNTER — Emergency Department (HOSPITAL_COMMUNITY): Payer: 59

## 2014-11-13 LAB — WET PREP, GENITAL
Clue Cells Wet Prep HPF POC: NONE SEEN
TRICH WET PREP: NONE SEEN
WBC, Wet Prep HPF POC: NONE SEEN
Yeast Wet Prep HPF POC: NONE SEEN

## 2014-11-13 MED ORDER — ONDANSETRON 4 MG PO TBDP
4.0000 mg | ORAL_TABLET | Freq: Once | ORAL | Status: AC
  Administered 2014-11-13: 4 mg via ORAL
  Filled 2014-11-13: qty 1

## 2014-11-13 MED ORDER — ONDANSETRON HCL 4 MG/2ML IJ SOLN
4.0000 mg | Freq: Once | INTRAMUSCULAR | Status: AC
  Administered 2014-11-13: 4 mg via INTRAVENOUS
  Filled 2014-11-13: qty 2

## 2014-11-13 MED ORDER — ONDANSETRON 4 MG PO TBDP: 4.0000 mg | ORAL_TABLET | Freq: Three times a day (TID) | ORAL | Status: DC | PRN

## 2014-11-13 MED ORDER — PROMETHAZINE HCL 25 MG/ML IJ SOLN
25.0000 mg | Freq: Once | INTRAMUSCULAR | Status: DC
  Filled 2014-11-13: qty 1

## 2014-11-13 MED ORDER — HYDROCODONE-ACETAMINOPHEN 5-325 MG PO TABS: 1.0000 | ORAL_TABLET | Freq: Four times a day (QID) | ORAL | Status: DC | PRN

## 2014-11-13 MED ORDER — MORPHINE SULFATE 4 MG/ML IJ SOLN
4.0000 mg | Freq: Once | INTRAMUSCULAR | Status: AC
  Administered 2014-11-13: 4 mg via INTRAMUSCULAR
  Filled 2014-11-13: qty 1

## 2014-11-13 MED ORDER — HYDROCODONE-ACETAMINOPHEN 5-325 MG PO TABS
1.0000 | ORAL_TABLET | Freq: Once | ORAL | Status: AC
  Administered 2014-11-13: 1 via ORAL
  Filled 2014-11-13: qty 1

## 2014-11-13 MED ORDER — PROMETHAZINE HCL 25 MG/ML IJ SOLN
25.0000 mg | Freq: Once | INTRAMUSCULAR | Status: AC
  Administered 2014-11-13: 25 mg via INTRAMUSCULAR
  Filled 2014-11-13: qty 1

## 2014-11-13 NOTE — ED Notes (Signed)
PT reports  Vomiting.  Presents brown water fluid in emesis bag.150-276ml

## 2014-11-13 NOTE — ED Notes (Signed)
Gave pt a gingerale  

## 2014-11-13 NOTE — Discharge Instructions (Signed)
Please follow up with your primary care physician in 1-2 days. If you do not have one please call the [facility] number listed above. Please follow up with an Ob/Gyn to schedule a follow up appointment.  Please read all discharge instructions and return precautions.     Abdominal Pain, Women Abdominal (stomach, pelvic, or belly) pain can be caused by many things. It is important to tell your doctor:  The location of the pain.  Does it come and go or is it present all the time?  Are there things that start the pain (eating certain foods, exercise)?  Are there other symptoms associated with the pain (fever, nausea, vomiting, diarrhea)? All of this is helpful to know when trying to find the cause of the pain. CAUSES   Stomach: virus or bacteria infection, or ulcer.  Intestine: appendicitis (inflamed appendix), regional ileitis (Crohn's disease), ulcerative colitis (inflamed colon), irritable bowel syndrome, diverticulitis (inflamed diverticulum of the colon), or cancer of the stomach or intestine.  Gallbladder disease or stones in the gallbladder.  Kidney disease, kidney stones, or infection.  Pancreas infection or cancer.  Fibromyalgia (pain disorder).  Diseases of the female organs:  Uterus: fibroid (non-cancerous) tumors or infection.  Fallopian tubes: infection or tubal pregnancy.  Ovary: cysts or tumors.  Pelvic adhesions (scar tissue).  Endometriosis (uterus lining tissue growing in the pelvis and on the pelvic organs).  Pelvic congestion syndrome (female organs filling up with blood just before the menstrual period).  Pain with the menstrual period.  Pain with ovulation (producing an egg).  Pain with an IUD (intrauterine device, birth control) in the uterus.  Cancer of the female organs.  Functional pain (pain not caused by a disease, may improve without treatment).  Psychological pain.  Depression. DIAGNOSIS  Your doctor will decide  the seriousness of your pain by doing an examination.  Blood tests.  X-rays.  Ultrasound.  CT scan (computed tomography, special type of X-ray).  MRI (magnetic resonance imaging).  Cultures, for infection.  Barium enema (dye inserted in the large intestine, to better view it with X-rays).  Colonoscopy (looking in intestine with a lighted tube).  Laparoscopy (minor surgery, looking in abdomen with a lighted tube).  Major abdominal exploratory surgery (looking in abdomen with a large incision). TREATMENT  The treatment will depend on the cause of the pain.   Many cases can be observed and treated at home.  Over-the-counter medicines recommended by your caregiver.  Prescription medicine.  Antibiotics, for infection.  Birth control pills, for painful periods or for ovulation pain.  Hormone treatment, for endometriosis.  Nerve blocking injections.  Physical therapy.  Antidepressants.  Counseling with a psychologist or psychiatrist.  Minor or major surgery. HOME CARE INSTRUCTIONS   Do not take laxatives, unless directed by your caregiver.  Take over-the-counter pain medicine only if ordered by your caregiver. Do not take aspirin because it can cause an upset stomach or bleeding.  Try a clear liquid diet (broth or water) as ordered by your caregiver. Slowly move to a bland diet, as tolerated, if the pain is related to the stomach or intestine.  Have a thermometer and take your temperature several times a day, and record it.  Bed rest and sleep, if it helps the pain.  Avoid sexual intercourse, if it causes pain.  Avoid stressful situations.  Keep your follow-up appointments and tests, as your caregiver orders.  If the pain does not go away with medicine or surgery, you may  try:  Acupuncture.  Relaxation exercises (yoga, meditation).  Group therapy.  Counseling. SEEK MEDICAL CARE IF:   You notice certain foods cause stomach pain.  Your home care  treatment is not helping your pain.  You need stronger pain medicine.  You want your IUD removed.  You feel faint or lightheaded.  You develop nausea and vomiting.  You develop a rash.  You are having side effects or an allergy to your medicine. SEEK IMMEDIATE MEDICAL CARE IF:   Your pain does not go away or gets worse.  You have a fever.  Your pain is felt only in portions of the abdomen. The right side could possibly be appendicitis. The left lower portion of the abdomen could be colitis or diverticulitis.  You are passing blood in your stools (bright red or black tarry stools, with or without vomiting).  You have blood in your urine.  You develop chills, with or without a fever.  You pass out. MAKE SURE YOU:   Understand these instructions.  Will watch your condition.  Will get help right away if you are not doing well or get worse. Document Released: 10/14/2007 Document Revised: 05/03/2014 Document Reviewed: 11/03/2009 [facility] Patient Information 2015 [place] [place]. This information is not intended to replace advice given to you by your health care provider. Make sure you discuss any questions you have with your health care provider.  RESOURCE GUIDE  If you do not have a primary care doctor to follow up with regarding today's visit, please call the [facility] at [phone] to make an appointment. Hours of operation are 10am - 7pm, Monday through Friday, and they have a sliding scale fee.    Insufficient Money for Medicine: Contact United Way:  call "211."   No Primary Care Doctor: - Call Health Connect  [phone] - can help you locate a primary care doctor that  accepts your insurance, provides certain services, etc. - Physician Referral Service- [phone]  Agencies that provide inexpensive medical care: - [facility]  [phone] - [facility]  [phone] - [facility]  [phone] - Women's  Clinic  [phone] - [facility]  [phone] [facility]  [phone]  Medicaid-accepting [facility] Providers: - [facility]- [address], [address]  [phone], Mon-Fri 9am-7pm, Sat 9am-1pm - [facility]- [address], [address] [place]  [phone] - [facility]- [address], [address] [place]  [phone] [facility]- [facility]- [address] [place] [address]  [phone] - Renaye RakersVeita Bland- [address], [address], [phone]  Only accepts [place] patients after they have their name  applied to their card  Self Pay (no insurance) in [place]: - [facility]- [address]  [phone]       [facility]-     [facility]- [address], [address]       -     [facility]- see information above (Speak to [employer]am H if you do not have insurance)       -  [facility] [place]- [address],  [phone]       -  [facility]- [address] [place] [address], [phone]       -  Dr Julio Sickssei-Bonsu-  [address], [address], [place], [phone]       -  [facility] - [address], [phone]       -  [facility]- [address] [phone], also [address], [phone]       -    [facility]- [address] [place], [phone], 1st & 3rd Saturday        every month, 10am-1pm  1) Find a Doctor and Pay Out of Pocket Although you won't have to find out who is covered by your insurance plan, it is a good idea to ask around and get recommendations. You will then need to call the office and see if the doctor you have chosen will accept you as a new patient and what types of options they offer for patients who are self-pay. Some doctors offer discounts or will set up payment plans for their patients who do not have insurance, but you will need to ask so you aren't surprised when you get to your appointment.  2) Contact  Your Local Health Department Not all health departments have doctors that can see patients for sick visits, but many do, so it is worth a call to see if yours does. If you don't know where your local health department is, you can check in your phone book. The CDC also has a tool to help you locate your state's health department, and many state websites also have listings of all of their local health departments.  3) Find a Walk-in Clinic If your illness is not likely to be very severe or complicated, you may want to try a walk in clinic. These are popping up all over the country in pharmacies, drugstores, and shopping centers. They're usually staffed by nurse practitioners or physician assistants that have been trained to treat common illnesses and complaints. They're usually fairly quick and inexpensive. However, if you have serious medical issues or chronic medical problems, these are probably not your best option  STD Testing - [facility] Department of [facility], STD Clinic, [address] [place], phone [phone] or [phone].  Monday - Friday, call for an appointment. [facility] Department of [employer], STD Clinic, [address], [place], phone [phone] or [phone].  Monday - Friday, call for an appointment.  Abuse/Neglect: [facility] Child Abuse Hotline 252-227-6614 [place] [employer] Child Abuse Hotline 9731351009 (After Hours)  Emergency Shelter:  [facility] 6164492840  Maternity Homes: - [facility] [place] [facility] [phone] - [facility] [phone]

## 2014-11-13 NOTE — ED Provider Notes (Signed)
Results for orders placed or performed during the hospital encounter of 11/12/14  Wet prep, genital  Result Value Ref Range   Yeast Wet Prep HPF POC NONE SEEN NONE SEEN   Trich, Wet Prep NONE SEEN NONE SEEN   Clue Cells Wet Prep HPF POC NONE SEEN NONE SEEN   WBC, Wet Prep HPF POC NONE SEEN NONE SEEN  Urinalysis, Routine w reflex microscopic  Result Value Ref Range   Color, Urine YELLOW YELLOW   APPearance CLEAR CLEAR   Specific Gravity, Urine 1.005 1.005 - 1.030   pH 6.0 5.0 - 8.0   Glucose, UA NEGATIVE NEGATIVE mg/dL   Hgb urine dipstick NEGATIVE NEGATIVE   Bilirubin Urine NEGATIVE NEGATIVE   Ketones, ur NEGATIVE NEGATIVE mg/dL   Protein, ur NEGATIVE NEGATIVE mg/dL   Urobilinogen, UA 0.2 0.0 - 1.0 mg/dL   Nitrite NEGATIVE NEGATIVE   Leukocytes, UA NEGATIVE NEGATIVE  Basic metabolic panel  Result Value Ref Range   Sodium 138 137 - 147 mEq/L   Potassium 3.7 3.7 - 5.3 mEq/L   Chloride 100 96 - 112 mEq/L   CO2 24 19 - 32 mEq/L   Glucose, Bld 87 70 - 99 mg/dL   BUN 6 6 - 23 mg/dL   Creatinine, Ser 1.610.75 0.50 - 1.10 mg/dL   Calcium 9.4 8.4 - 09.610.5 mg/dL   GFR calc non Af Amer >90 >90 mL/min   GFR calc Af Amer >90 >90 mL/min   Anion gap 14 5 - 15  CBC with Differential  Result Value Ref Range   WBC 9.1 4.0 - 10.5 K/uL   RBC 4.26 3.87 - 5.11 MIL/uL   Hemoglobin 13.5 12.0 - 15.0 g/dL   HCT 04.540.2 40.936.0 - 81.146.0 %   MCV 94.4 78.0 - 100.0 fL   MCH 31.7 26.0 - 34.0 pg   MCHC 33.6 30.0 - 36.0 g/dL   RDW 91.413.3 78.211.5 - 95.615.5 %   Platelets 213 150 - 400 K/uL   Neutrophils Relative % 58 43 - 77 %   Neutro Abs 5.3 1.7 - 7.7 K/uL   Lymphocytes Relative 35 12 - 46 %   Lymphs Abs 3.2 0.7 - 4.0 K/uL   Monocytes Relative 6 3 - 12 %   Monocytes Absolute 0.5 0.1 - 1.0 K/uL   Eosinophils Relative 1 0 - 5 %   Eosinophils Absolute 0.1 0.0 - 0.7 K/uL   Basophils Relative 0 0 - 1 %   Basophils Absolute 0.0 0.0 - 0.1 K/uL  I-Stat Beta hCG blood, ED ([facility], [facility], [facility] only)  Result Value Ref Range   I-stat  hCG, quantitative <5.0 <5 mIU/mL   Comment 3           [place] Transvaginal Non-ob  11/13/2014   CLINICAL DATA:  Left lower quadrant pain  EXAM: TRANSABDOMINAL AND TRANSVAGINAL ULTRASOUND OF PELVIS  TECHNIQUE: Both transabdominal and transvaginal ultrasound examinations of the pelvis were performed. Transabdominal technique was performed for global imaging of the pelvis including uterus, ovaries, adnexal regions, and pelvic cul-de-sac. It was necessary to proceed with endovaginal exam following the transabdominal exam to visualize the endometrium and ovaries.  COMPARISON:  None  FINDINGS: Uterus  Measurements: 7 x 2.3 x 4.7 cm. No fibroids or other mass visualized.  Endometrium  Thickness: 7.7 mm.  No focal abnormality visualized.  Right ovary  Prior right oophorectomy.  Left ovary  Measurements: 3.5 x 2.7 x 2.2 cm. Normal appearance/no adnexal mass.  Other findings  There is a  small amount of pelvic free fluid likely physiologic.  IMPRESSION: 1. Prior right oophorectomy. 2. Otherwise normal pelvic ultrasound.   Electronically Signed   By: Elige Ko   On: 11/13/2014 02:29   US Pelvis Complete  11/13/2014   CLINICAL DATA:  Left lower quadrant pain  EXAM: TRANSABDOMINAL AND TRANSVAGINAL ULTRASOUND OF PELVIS  TECHNIQUE: Both transabdominal and transvaginal ultrasound examinations of the pelvis were performed. Transabdominal technique was performed for global imaging of the pelvis including uterus, ovaries, adnexal regions, and pelvic cul-de-sac. It was necessary to proceed with endovaginal exam following the transabdominal exam to visualize the endometrium and ovaries.  COMPARISON:  None  FINDINGS: Uterus  Measurements: 7 x 2.3 x 4.7 cm. No fibroids or other mass visualized.  Endometrium  Thickness: 7.7 mm.  No focal abnormality visualized.  Right ovary  Prior right oophorectomy.  Left ovary  Measurements: 3.5 x 2.7 x 2.2 cm. Normal appearance/no adnexal mass.  Other findings  There is a small amount of pelvic  free fluid likely physiologic.  IMPRESSION: 1. Prior right oophorectomy. 2. Otherwise normal pelvic ultrasound.   Electronically Signed   By: Elige Ko   On: 11/13/2014 02:29    1. LLQ pain     Reevaluation patient complaining of continued nausea and lower abdominal pain. Discussed wet prep and ultrasound results with her. There is small amount of pelvic free fluid that is likely physiologic otherwise normal ultrasound. Discussed that patient will need to follow up with OB/GYN regarding her continued pelvic pain. Did discuss that it will take 48-72 hours for her GC chlamydia results to return. Return precautions were discussed. Patient is agreeable to plan. Patient is stable at time of discharge    Jeannetta Ellis, PA-C 11/13/14 2641  Loren Racer, MD 11/14/14 [phone]

## 2014-11-15 LAB — GC/CHLAMYDIA PROBE AMP
CT PROBE, AMP APTIMA: NEGATIVE
GC Probe RNA: NEGATIVE

## 2014-11-18 ENCOUNTER — Encounter: Payer: Self-pay | Admitting: Gynecology

## 2014-11-18 ENCOUNTER — Ambulatory Visit (INDEPENDENT_AMBULATORY_CARE_PROVIDER_SITE_OTHER): Payer: Self-pay | Admitting: Gynecology

## 2014-11-18 VITALS — BP 120/70

## 2014-11-18 DIAGNOSIS — R102 Pelvic and perineal pain: Secondary | ICD-10-CM

## 2014-11-18 NOTE — Patient Instructions (Signed)
Follow up if you want to pursue medical treatment for your pelvic pain.

## 2014-11-18 NOTE — Progress Notes (Signed)
[name] 08/11/1986 [id number]        28 y.o.  G0P0 presents as a new patient to myself with a complex history to include a laparoscopic appendectomy/right ovarian cystectomy 2005. Subsequent laparoscopic hypoplastic right uterine horn excision with right salpingo-oophorectomy 2007 for pelvic pain. History of recurrent pelvic pain primarily on the left over the years with a most recent exacerbation several days ago leading to an emergency room evaluation. Workup included a normal ultrasound showing a normal left ovary, normal white count, negative GC/Chlamydia, negative hCG and a normal urinalysis. Patient's pain has continued and she presents for follow up. Patient's interested in having a hysterectomy with removal of the left tube and ovary as she is convinced this is the source of her pain. Just started her menses yesterday. Does note some irregularity in her cycles which she will skip some months. Patient is gay not using contraception. Had a trial of oral contraceptives for menstrual regulation in the past and reports that this worked to regulate her menses but she continued to have pain.  Past medical history,surgical history, problem list, medications, allergies, family history and social history were all reviewed and documented in the EPIC chart.  Directed ROS with pertinent positives and negatives documented in the history of present illness/assessment and plan.  Exam: Kim assistant General appearance:  Normal Abdomen soft with tenderness in the left lower quadrant to palpation. No rebound guarding or masses active bowel sounds throughout. Pelvic external BUS vagina with light menses flow. Cervix normal. Uterus grossly normal midline mobile. Adnexa without masses. Tender to manipulation on the left.  Assessment/Plan:  28 y.o. G0P0 with long history of recurrent pelvic pain attributable to "ovarian cysts". Status post RSO with hyperplastic uterine horn excision. Was told that her ovary was  "covered in cysts". Reviewed differential to include GYN versus non-GYN such as GI or GU. Urinalysis is negative. She is without GI symptoms otherwise without vomiting nausea constipation or diarrhea. GYN etiology to include ruptured ovarian cyst for the acute event. 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 Patient's very adamant about having her remaining ovary and tube removed. I discussed alternatives to include trial of oral contraceptive suppression to include continuous oral contraception, Depo-Lupron trial, Mirena IUD, diagnostic laparoscopy with treatment of encountered disease such as endometriosis or adhesions.  Patient is not interested in any of this but only wants hysterectomy/removal of her remaining tube and ovary. I discussed the benefits of ongoing hormonal production and that I'm very reluctant to remove her remaining ovary in a 28 year old woman particularly having just met her and not trying more conservative alternatives. Patient declines any further treatment from me and I encouraged her to follow up with me if she desires to pursue a more conservative treatment approach.     Anastasio Auerbach MD, 8:34 AM 11/18/2014

## 2014-12-02 ENCOUNTER — Emergency Department (HOSPITAL_COMMUNITY): Payer: Medicaid Other

## 2014-12-02 ENCOUNTER — Encounter (HOSPITAL_COMMUNITY): Payer: Self-pay | Admitting: Emergency Medicine

## 2014-12-02 ENCOUNTER — Emergency Department (HOSPITAL_COMMUNITY)
Admission: EM | Admit: 2014-12-02 | Discharge: 2014-12-03 | Disposition: A | Discharge status: Home / Self Care | Payer: Medicaid Other | Attending: Emergency Medicine | Admitting: Emergency Medicine

## 2014-12-02 DIAGNOSIS — Z79899 Other long term (current) drug therapy: Secondary | ICD-10-CM | POA: Diagnosis not present

## 2014-12-02 DIAGNOSIS — R410 Disorientation, unspecified: Secondary | ICD-10-CM | POA: Diagnosis not present

## 2014-12-02 DIAGNOSIS — Z872 Personal history of diseases of the skin and subcutaneous tissue: Secondary | ICD-10-CM | POA: Insufficient documentation

## 2014-12-02 DIAGNOSIS — Z7951 Long term (current) use of inhaled steroids: Secondary | ICD-10-CM | POA: Insufficient documentation

## 2014-12-02 DIAGNOSIS — R110 Nausea: Secondary | ICD-10-CM | POA: Insufficient documentation

## 2014-12-02 DIAGNOSIS — Q513 Bicornate uterus: Secondary | ICD-10-CM | POA: Diagnosis not present

## 2014-12-02 DIAGNOSIS — Z8719 Personal history of other diseases of the digestive system: Secondary | ICD-10-CM | POA: Diagnosis not present

## 2014-12-02 DIAGNOSIS — Z8742 Personal history of other diseases of the female genital tract: Secondary | ICD-10-CM | POA: Diagnosis not present

## 2014-12-02 DIAGNOSIS — R451 Restlessness and agitation: Secondary | ICD-10-CM | POA: Diagnosis present

## 2014-12-02 DIAGNOSIS — Z720 Tobacco use: Secondary | ICD-10-CM | POA: Insufficient documentation

## 2014-12-02 LAB — COMPREHENSIVE METABOLIC PANEL
ALBUMIN: 3.9 g/dL (ref 3.5–5.2)
ALT: 8 U/L (ref 0–35)
AST: 12 U/L (ref 0–37)
Alkaline Phosphatase: 57 U/L (ref 39–117)
Anion gap: 12 (ref 5–15)
BUN: 4 mg/dL — ABNORMAL LOW (ref 6–23)
CALCIUM: 9.4 mg/dL (ref 8.4–10.5)
CO2: 26 meq/L (ref 19–32)
CREATININE: 0.74 mg/dL (ref 0.50–1.10)
Chloride: 101 mEq/L (ref 96–112)
GFR calc Af Amer: >90 mL/min (ref >90)
GFR calc non Af Amer: >90 mL/min (ref >90)
Glucose, Bld: 89 mg/dL (ref 70–99)
Potassium: 3.2 mEq/L — ABNORMAL LOW (ref 3.7–5.3)
Sodium: 139 mEq/L (ref 137–147)
TOTAL PROTEIN: 6.7 g/dL (ref 6.0–8.3)
Total Bilirubin: 0.4 mg/dL (ref 0.3–1.2)

## 2014-12-02 LAB — CBC WITH DIFFERENTIAL/PLATELET
BASOS ABS: 0 10*3/uL (ref 0.0–0.1)
BASOS PCT: 0 % (ref 0–1)
EOS ABS: 0 10*3/uL (ref 0.0–0.7)
Eosinophils Relative: 0 % (ref 0–5)
HCT: 38.3 % (ref 36.0–46.0)
Hemoglobin: 12.2 g/dL (ref 12.0–15.0)
LYMPHS PCT: 27 % (ref 12–46)
Lymphs Abs: 2.1 10*3/uL (ref 0.7–4.0)
MCH: 31.2 pg (ref 26.0–34.0)
MCHC: 31.9 g/dL (ref 30.0–36.0)
MCV: 98 fL (ref 78.0–100.0)
MONO ABS: 0.3 10*3/uL (ref 0.1–1.0)
Monocytes Relative: 4 % (ref 3–12)
Neutro Abs: 5.4 10*3/uL (ref 1.7–7.7)
Neutrophils Relative %: 69 % (ref 43–77)
PLATELETS: 205 10*3/uL (ref 150–400)
RBC: 3.91 MIL/uL (ref 3.87–5.11)
RDW: 13.4 % (ref 11.5–15.5)
WBC: 7.8 10*3/uL (ref 4.0–10.5)

## 2014-12-02 LAB — URINALYSIS, ROUTINE W REFLEX MICROSCOPIC
Bilirubin Urine: NEGATIVE
Glucose, UA: NEGATIVE mg/dL
Hgb urine dipstick: NEGATIVE
Ketones, ur: NEGATIVE mg/dL
Nitrite: NEGATIVE
PH: 6.5 (ref 5.0–8.0)
PROTEIN: NEGATIVE mg/dL
Specific Gravity, Urine: 1.007 (ref 1.005–1.030)
Urobilinogen, UA: 0.2 mg/dL (ref 0.0–1.0)

## 2014-12-02 LAB — RAPID URINE DRUG SCREEN, HOSP PERFORMED
AMPHETAMINES: NOT DETECTED
Barbiturates: NOT DETECTED
Benzodiazepines: POSITIVE — AB
Cocaine: NOT DETECTED
OPIATES: NOT DETECTED
Tetrahydrocannabinol: NOT DETECTED

## 2014-12-02 LAB — URINE MICROSCOPIC-ADD ON

## 2014-12-02 LAB — ACETAMINOPHEN LEVEL: Acetaminophen (Tylenol), Serum: <15 ug/mL (ref 10–30)

## 2014-12-02 LAB — ETHANOL: Alcohol, Ethyl (B): <11 mg/dL (ref 0–11)

## 2014-12-02 LAB — AMMONIA: Ammonia: 21 umol/L (ref 11–60)

## 2014-12-02 LAB — SALICYLATE LEVEL

## 2014-12-02 MED ORDER — VENLAFAXINE HCL ER 37.5 MG PO CP24
37.5000 mg | ORAL_CAPSULE | Freq: Every day | ORAL | Status: DC
  Filled 2014-12-02: qty 1

## 2014-12-02 MED ORDER — FLUTICASONE PROPIONATE 50 MCG/ACT NA SUSP
1.0000 | Freq: Every day | NASAL | Status: DC
  Filled 2014-12-02: qty 16

## 2014-12-02 MED ORDER — TRAZODONE HCL 100 MG PO TABS
100.0000 mg | ORAL_TABLET | Freq: Every day | ORAL | Status: DC
  Administered 2014-12-02: 100 mg via ORAL
  Filled 2014-12-02: qty 1

## 2014-12-02 MED ORDER — ALPRAZOLAM 1 MG PO TABS
1.0000 mg | ORAL_TABLET | Freq: Two times a day (BID) | ORAL | Status: DC | PRN
  Administered 2014-12-02: 1 mg via ORAL
  Filled 2014-12-02: qty 1

## 2014-12-02 MED ORDER — BUPROPION HCL ER (XL) 150 MG PO TB24
150.0000 mg | ORAL_TABLET | Freq: Every day | ORAL | Status: DC
  Filled 2014-12-02: qty 1

## 2014-12-02 MED ORDER — DIAZEPAM 5 MG PO TABS
10.0000 mg | ORAL_TABLET | Freq: Three times a day (TID) | ORAL | Status: DC | PRN
  Administered 2014-12-02: 10 mg via ORAL
  Filled 2014-12-02: qty 2

## 2014-12-02 MED ORDER — TRIAMCINOLONE ACETONIDE 55 MCG/ACT NA AERO: 2.0000 | INHALATION_SPRAY | Freq: Every day | NASAL | Status: DC

## 2014-12-02 MED ORDER — VENLAFAXINE HCL ER 75 MG PO CP24
75.0000 mg | ORAL_CAPSULE | Freq: Every day | ORAL | Status: DC
  Filled 2014-12-02 (×2): qty 1
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
## 2014-12-02 NOTE — BHH Counselor (Signed)
Per Verne Spurr, PA, pt does not meet criteria for inpatient admission. Pt given resources for outpatient services. Pt made aware of plan by TTS Therapist. Pt's attending physician, Dr. Jeraldine Loots, also made aware of plan.   Cyndie Mull, [facility] Triage Specialist

## 2014-12-02 NOTE — ED Notes (Signed)
[facility] Expected date:  Expected time:  Means of arrival:  Comments: [id number]
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
## 2014-12-02 NOTE — ED Notes (Signed)
Patient belongings sent home with girlfriend, Victorino DikeJennifer per patient request.

## 2014-12-02 NOTE — ED Provider Notes (Signed)
Patient is appropriate for D/C, per psych. She will be provided additional resources, should she choose to follow-up with a different psychiatrist.   Gerhard Munch, MD 12/02/14 2216

## 2014-12-02 NOTE — ED Notes (Signed)
Girlfriend up to nurses station and provided more detailed history. States pt has been having "black out" spells where she ends up places and doesn't know how she got there or forgets who is around her. Hx of PTSD from childhood. States these spells are happening daily. Recently had medications adjusted, approximately 2 months ago. Pt has not been following up with psychiatry due to expenses.
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
## 2014-12-03 NOTE — [facility] Counselor (Signed)
[facility] who stated that pt had not been put up for discharge by a provider yet. Spoke with Azalia BilisKevin Campos, PA about pt needing to be discharged so she can get a ride home. Per Verne SpurrNeil Mashburn, PA, she does not meet inpatient criteria and was provided with outpatient resources by TTS counselor.      Cyndie MullAnna Mykelle Cockerell, [facility] Triage Specialist

## 2014-12-04 LAB — URINE CULTURE
COLONY COUNT: NO GROWTH
Culture: NO GROWTH
SPECIAL REQUESTS: NORMAL

## 2015-01-01 ENCOUNTER — Emergency Department (HOSPITAL_COMMUNITY)
Admission: EM | Admit: 2015-01-01 | Discharge: 2015-01-01 | Disposition: A | Discharge status: Home / Self Care | Payer: No Typology Code available for payment source | Attending: Emergency Medicine | Admitting: Emergency Medicine

## 2015-01-01 ENCOUNTER — Encounter (HOSPITAL_COMMUNITY): Payer: Self-pay | Admitting: Emergency Medicine

## 2015-01-01 DIAGNOSIS — N750 Cyst of Bartholin's gland: Secondary | ICD-10-CM | POA: Diagnosis not present

## 2015-01-01 DIAGNOSIS — Z720 Tobacco use: Secondary | ICD-10-CM | POA: Diagnosis not present

## 2015-01-01 DIAGNOSIS — Z8719 Personal history of other diseases of the digestive system: Secondary | ICD-10-CM | POA: Insufficient documentation

## 2015-01-01 DIAGNOSIS — F329 Major depressive disorder, single episode, unspecified: Secondary | ICD-10-CM | POA: Insufficient documentation

## 2015-01-01 DIAGNOSIS — Z79899 Other long term (current) drug therapy: Secondary | ICD-10-CM | POA: Insufficient documentation

## 2015-01-01 DIAGNOSIS — F419 Anxiety disorder, unspecified: Secondary | ICD-10-CM | POA: Diagnosis not present

## 2015-01-01 MED ORDER — LIDOCAINE-EPINEPHRINE (PF) 2 %-1:200000 IJ SOLN: 20.0000 mL | Freq: Once | INTRAMUSCULAR | Status: DC

## 2015-01-01 MED ORDER — LIDOCAINE-EPINEPHRINE 2 %-1:100000 IJ SOLN: 1.7000 mL | Freq: Once | INTRAMUSCULAR | Status: DC

## 2015-01-01 MED ORDER — LIDOCAINE-EPINEPHRINE (PF) 2 %-1:200000 IJ SOLN
10.0000 mL | Freq: Once | INTRAMUSCULAR | Status: AC
  Administered 2015-01-01: 10 mL
  Filled 2015-01-01: qty 20

## 2015-01-01 NOTE — ED Notes (Signed)
Pt in a gown. Pt hooked up to the monitor with BP cuff and pulse ox

## 2015-01-01 NOTE — Discharge Instructions (Signed)
Bartholin's Cyst or Abscess Bartholin's glands are small glands located within the folds of skin (labia) along the sides of the lower opening of the vagina (birth canal). A cyst may develop when the duct of the gland becomes blocked. When this happens, fluid that accumulates within the cyst can become infected. This is known as an abscess. NO ABSCESS WAS SEEN TODAY--NO INFECTION, ONLY MUCUS.  The Bartholin gland produces a mucous fluid to lubricate the outside of the vagina during sexual intercourse. SYMPTOMS   Patients with a small cyst may not have any symptoms.  Mild discomfort to severe pain depending on the size of the cyst and if it is infected (abscess).  Pain, redness, and swelling around the lower opening of the vagina.  Painful intercourse.  Pressure in the perineal area.  Swelling of the lips of the vagina (labia).  The cyst or abscess can be on one side or both sides of the vagina. DIAGNOSIS   A large swelling is seen in the lower vagina area by your caregiver.  Painful to touch.  Redness and pain, if it is an abscess. TREATMENT   Sometimes the cyst will go away on its own.  Apply warm wet compresses to the area or take hot sitz baths several times a day.  An incision to drain the cyst or abscess with local anesthesia.  Culture the pus, if it is an abscess.  Antibiotic treatment, if it is an abscess.  Cut open the gland and suture the edges to make the opening of the gland bigger (marsupialization).  Remove the whole gland if the cyst or abscess returns. PREVENTION   Practice good hygiene.  Clean the vaginal area with a mild soap and soft cloth when bathing.  Do not rub hard in the vaginal area when bathing.  Protect the crotch area with a padded cushion if you take long bike rides or ride horses.  Be sure you are well lubricated when you have sexual intercourse. HOME CARE INSTRUCTIONS   If your cyst or abscess was opened, a small piece of gauze, or a  drain, may have been placed in the wound to allow drainage. Do not remove this gauze or drain unless directed by your caregiver.  Wear feminine pads, not tampons, as needed for any drainage or bleeding.  If antibiotics were prescribed, take them exactly as directed. Finish the entire course.  Only take over-the-counter or prescription medicines for pain, discomfort, or fever as directed by your caregiver. SEEK IMMEDIATE MEDICAL CARE IF:   You have an increase in pain, redness, swelling, or drainage.  You have bleeding from the wound which results in the use of more than the number of pads suggested by your caregiver in 24 hours.  You have chills.  You have a fever.  You develop any new problems (symptoms) or aggravation of your existing condition. MAKE SURE YOU:   Understand these instructions.  Will watch your condition.  Will get help right away if you are not doing well or get worse. Document Released: 12/17/2005 Document Revised: 03/10/2012 Document Reviewed: 08/04/2008 [facility] Patient Information 2015 [place], [place]. This information is not intended to replace advice given to you by your health care provider. Make sure you discuss any questions you have with your health care provider.

## 2015-01-01 NOTE — ED Provider Notes (Signed)
CSN: [id number]     Arrival date & time 01/01/15  0038 History   First MD Initiated Contact with Patient 01/01/15 0241     Chief Complaint  Patient presents with  . Bartholin's Cyst     (Consider location/radiation/quality/duration/timing/severity/associated sxs/prior Treatment) HPI 29 year old female with swelling in her left vaginal wall.  She has history of Bartholin's cyst.  She reports swelling started today, was worse earlier, but is now improved.  No fever, chills or red streaking.  No new sexual partners.  No vaginal discharge Past Medical History  Diagnosis Date  . Depression   . Anxiety   . History of ovarian cyst   . Bartholin cyst LEFT SIDE  . Bicornuate uterus S/P EXCISION RIGHT UTERUS HORN 2007  . GERD (gastroesophageal reflux disease)    Past Surgical History  Procedure Laterality Date  . Laparoscopic appendectomy and right ovarian cystectomy  09-04-2004  . Laparoscopic excision hypoplastic right uterine horn and right salpingoophectomy  03-18-2006  . Bartholin cyst marsupialization  07/18/2012    Procedure: BARTHOLIN CYST MARSUPIALIZATION;  Surgeon: Ok Edwards, MD;  Location: [facility];  Service: Gynecology;  Laterality: Left;  Left Bartholin Duct Marsupilization. One hour.  Thanks!   Family History  Problem Relation Age of Onset  . Diabetes Mother   . Hyperlipidemia Mother   . Hypertension Maternal Grandmother   . Heart disease Maternal Grandmother   . Diabetes Maternal Grandfather   . Hypertension Maternal Grandfather   . Hyperlipidemia Maternal Grandfather   . Breast cancer Paternal Grandmother    History  Substance Use Topics  . Smoking status: Current Every Day Smoker -- 0.50 packs/day    Types: Cigarettes  . Smokeless tobacco: Never Used  . Alcohol Use: No   OB History    Gravida Para Term Preterm AB TAB SAB Ectopic Multiple Living   0              Review of Systems  See History of Present Illness; otherwise all other  systems are reviewed and negative   Allergies  Review of patient's allergies indicates no known allergies.  Home Medications   Prior to Admission medications   Medication Sig Start Date End Date Taking? Authorizing Provider  Brexpiprazole (REXULTI) 1 MG TABS Take 1 tablet by mouth at bedtime.   Yes Historical Provider, MD  buPROPion (WELLBUTRIN XL) 150 MG 24 hr tablet Take 150 mg by mouth daily.   Yes Historical Provider, MD  desvenlafaxine (PRISTIQ) 50 MG 24 hr tablet Take 50 mg by mouth daily.   Yes Historical Provider, MD  diazepam (VALIUM) 10 MG tablet Take 10 mg by mouth as needed for anxiety.   Yes Historical Provider, MD  traZODone (DESYREL) 100 MG tablet Take 100 mg by mouth at bedtime.   Yes Historical Provider, MD  triamcinolone (NASACORT ALLERGY 24HR) 55 MCG/ACT AERO nasal inhaler Place 2 sprays into both nostrils daily.   Yes Historical Provider, MD  ALPRAZolam Prudy Feeler) 1 MG tablet Take 1 mg by mouth 2 (two) times daily as needed for anxiety.    Historical Provider, MD  desvenlafaxine (PRISTIQ) 100 MG 24 hr tablet Take 50 mg by mouth daily.    Historical Provider, MD  diazepam (VALIUM) 5 MG tablet Take 10 mg by mouth every 8 (eight) hours as needed. For anxiety    Historical Provider, MD  HYDROcodone-acetaminophen (NORCO/VICODIN) 5-325 MG per tablet Take 1-2 tablets by mouth every 6 (six) hours as needed for severe pain. Patient not  taking: Reported on 12/02/2014 11/13/14   Victorino Dike L Piepenbrink, PA-C   BP 96/60 mmHg  Pulse 65  Temp(Src) 98.1 F (36.7 C) (Oral)  Resp 16  Ht  (1.651 m)  Wt 140 lb (63.504 kg)  BMI 23.30 kg/m2  SpO2 98%  LMP 12/11/2014 Physical Exam  Constitutional: She is oriented to person, place, and time. She appears well-developed and well-nourished. No distress.  HENT:  Head: Normocephalic and atraumatic.  Eyes: Conjunctivae and EOM are normal. Pupils are equal, round, and reactive to light.  Cardiovascular: Normal rate, regular rhythm, normal  heart sounds and intact distal pulses.  Exam reveals no gallop and no friction rub.   No murmur heard. Pulmonary/Chest: Effort normal and breath sounds normal. No respiratory distress. She has no wheezes. She has no rales. She exhibits no tenderness.  Abdominal: Soft. Bowel sounds are normal. She exhibits no distension and no mass. There is no tenderness. There is no rebound and no guarding.  Genitourinary:  Patient with swelling to left labia/introitus.  No erythema, warmth or induration  Musculoskeletal: Normal range of motion. She exhibits no edema or tenderness.  Neurological: She is alert and oriented to person, place, and time. She displays normal reflexes. She exhibits normal muscle tone. Coordination normal.  Skin: Skin is warm and dry. No rash noted. No erythema. No pallor.  Psychiatric: She has a normal mood and affect. Her behavior is normal. Judgment and thought content normal.  Nursing note and vitals reviewed.   ED Course  Procedures (including critical care time) Labs Review Labs Reviewed - No data to display  Imaging Review No results found.   EKG Interpretation None     INCISION AND DRAINAGE Performed by: Olivia Mackie Consent: Verbal consent obtained. Risks and benefits: risks, benefits and alternatives were discussed Type: abscess  Body area: left vaginal introitus  Anesthesia: local infiltration  Incision was made with a scalpel.  Local anesthetic: lidocaine 2% with epinephrine  Anesthetic total: 3 ml  Complexity: simple Blunt dissection to break up loculations  Drainage: clear mucoid  Drainage amount: <10cc  Packing material: none  Patient tolerance: Patient tolerated the procedure well with no immediate complications.    MDM   Final diagnoses:  Bartholin gland cyst    29 year old female with recurrent Bartholin's cyst.  Cyst was I&D here, no signs of infection, no redness no purulence.  Fluid expressed was clear and mucoid.  Patient to  follow-up with GYN.    Olivia Mackie, MD 01/01/15 [phone]

## 2015-01-01 NOTE — ED Notes (Signed)
Pt. reports pain and discharge  at left Bartholin's cyst  Onset today , denies fever or chills.

## 2015-01-10 ENCOUNTER — Emergency Department (HOSPITAL_COMMUNITY)
Admission: EM | Admit: 2015-01-10 | Discharge: 2015-01-10 | Disposition: A | Discharge status: Home / Self Care | Payer: No Typology Code available for payment source | Attending: Emergency Medicine | Admitting: Emergency Medicine

## 2015-01-10 ENCOUNTER — Encounter (HOSPITAL_COMMUNITY): Payer: Self-pay | Admitting: Family Medicine

## 2015-01-10 ENCOUNTER — Emergency Department (HOSPITAL_COMMUNITY): Payer: No Typology Code available for payment source

## 2015-01-10 DIAGNOSIS — R109 Unspecified abdominal pain: Secondary | ICD-10-CM

## 2015-01-10 DIAGNOSIS — Z3202 Encounter for pregnancy test, result negative: Secondary | ICD-10-CM | POA: Diagnosis not present

## 2015-01-10 DIAGNOSIS — N9489 Other specified conditions associated with female genital organs and menstrual cycle: Secondary | ICD-10-CM

## 2015-01-10 DIAGNOSIS — Z79899 Other long term (current) drug therapy: Secondary | ICD-10-CM | POA: Insufficient documentation

## 2015-01-10 DIAGNOSIS — N83299 Other ovarian cyst, unspecified side: Secondary | ICD-10-CM

## 2015-01-10 DIAGNOSIS — F329 Major depressive disorder, single episode, unspecified: Secondary | ICD-10-CM | POA: Insufficient documentation

## 2015-01-10 DIAGNOSIS — N8320 Unspecified ovarian cysts: Secondary | ICD-10-CM | POA: Insufficient documentation

## 2015-01-10 DIAGNOSIS — Z720 Tobacco use: Secondary | ICD-10-CM | POA: Insufficient documentation

## 2015-01-10 DIAGNOSIS — Z7951 Long term (current) use of inhaled steroids: Secondary | ICD-10-CM | POA: Insufficient documentation

## 2015-01-10 DIAGNOSIS — Z8719 Personal history of other diseases of the digestive system: Secondary | ICD-10-CM | POA: Diagnosis not present

## 2015-01-10 DIAGNOSIS — F419 Anxiety disorder, unspecified: Secondary | ICD-10-CM | POA: Insufficient documentation

## 2015-01-10 DIAGNOSIS — N858 Other specified noninflammatory disorders of uterus: Secondary | ICD-10-CM | POA: Insufficient documentation

## 2015-01-10 DIAGNOSIS — R10A0 Flank pain, unspecified side: Secondary | ICD-10-CM

## 2015-01-10 LAB — URINALYSIS, ROUTINE W REFLEX MICROSCOPIC
Bilirubin Urine: NEGATIVE
Glucose, UA: NEGATIVE mg/dL
Hgb urine dipstick: NEGATIVE
Ketones, ur: NEGATIVE mg/dL
LEUKOCYTES UA: NEGATIVE
Nitrite: NEGATIVE
PROTEIN: NEGATIVE mg/dL
SPECIFIC GRAVITY, URINE: 1.014 (ref 1.005–1.030)
Urobilinogen, UA: 0.2 mg/dL (ref 0.0–1.0)
pH: 5 (ref 5.0–8.0)

## 2015-01-10 LAB — COMPREHENSIVE METABOLIC PANEL
ALT: 10 U/L (ref 0–35)
AST: 19 U/L (ref 0–37)
Albumin: 3.7 g/dL (ref 3.5–5.2)
Alkaline Phosphatase: 66 U/L (ref 39–117)
Anion gap: 5 (ref 5–15)
BILIRUBIN TOTAL: 0.6 mg/dL (ref 0.3–1.2)
BUN: <5 mg/dL — ABNORMAL LOW (ref 6–23)
CALCIUM: 8.8 mg/dL (ref 8.4–10.5)
CO2: 27 mmol/L (ref 19–32)
CREATININE: 0.85 mg/dL (ref 0.50–1.10)
Chloride: 104 mEq/L (ref 96–112)
Glucose, Bld: 93 mg/dL (ref 70–99)
Potassium: 3.6 mmol/L (ref 3.5–5.1)
SODIUM: 136 mmol/L (ref 135–145)
TOTAL PROTEIN: 6.4 g/dL (ref 6.0–8.3)

## 2015-01-10 LAB — CBC WITH DIFFERENTIAL/PLATELET
Basophils Absolute: 0 10*3/uL (ref 0.0–0.1)
Basophils Relative: 0 % (ref 0–1)
EOS ABS: 0.1 10*3/uL (ref 0.0–0.7)
Eosinophils Relative: 1 % (ref 0–5)
HEMATOCRIT: 40.4 % (ref 36.0–46.0)
Hemoglobin: 13.3 g/dL (ref 12.0–15.0)
LYMPHS ABS: 2.1 10*3/uL (ref 0.7–4.0)
Lymphocytes Relative: 21 % (ref 12–46)
MCH: 32 pg (ref 26.0–34.0)
MCHC: 32.9 g/dL (ref 30.0–36.0)
MCV: 97.3 fL (ref 78.0–100.0)
MONO ABS: 0.5 10*3/uL (ref 0.1–1.0)
Monocytes Relative: 4 % (ref 3–12)
NEUTROS ABS: 7.6 10*3/uL (ref 1.7–7.7)
Neutrophils Relative %: 74 % (ref 43–77)
Platelets: 217 10*3/uL (ref 150–400)
RBC: 4.15 MIL/uL (ref 3.87–5.11)
RDW: 13 % (ref 11.5–15.5)
WBC: 10.3 10*3/uL (ref 4.0–10.5)

## 2015-01-10 LAB — POC URINE PREG, ED: Preg Test, Ur: NEGATIVE

## 2015-01-10 LAB — WET PREP, GENITAL
Clue Cells Wet Prep HPF POC: NONE SEEN
Trich, Wet Prep: NONE SEEN
YEAST WET PREP: NONE SEEN

## 2015-01-10 LAB — LIPASE, BLOOD: Lipase: 37 U/L (ref 11–59)

## 2015-01-10 MED ORDER — SODIUM CHLORIDE 0.9 % IV BOLUS (SEPSIS)
1000.0000 mL | Freq: Once | INTRAVENOUS | Status: AC
  Administered 2015-01-10: 1000 mL via INTRAVENOUS

## 2015-01-10 MED ORDER — FENTANYL CITRATE 0.05 MG/ML IJ SOLN
50.0000 ug | Freq: Once | INTRAMUSCULAR | Status: AC
  Administered 2015-01-10: 50 ug via INTRAVENOUS
  Filled 2015-01-10: qty 2

## 2015-01-10 MED ORDER — HYDROMORPHONE HCL 1 MG/ML IJ SOLN: 0.5000 mg | Freq: Once | INTRAMUSCULAR | Status: DC

## 2015-01-10 MED ORDER — ONDANSETRON HCL 4 MG/2ML IJ SOLN
4.0000 mg | Freq: Once | INTRAMUSCULAR | Status: AC
  Administered 2015-01-10: 4 mg via INTRAVENOUS
  Filled 2015-01-10: qty 2

## 2015-01-10 MED ORDER — IBUPROFEN 600 MG PO TABS: 600.0000 mg | ORAL_TABLET | Freq: Four times a day (QID) | ORAL | Status: DC | PRN

## 2015-01-10 MED ORDER — HYDROMORPHONE HCL 1 MG/ML IJ SOLN
0.5000 mg | Freq: Once | INTRAMUSCULAR | Status: AC
  Administered 2015-01-10: 0.5 mg via INTRAVENOUS
  Filled 2015-01-10: qty 1

## 2015-01-10 MED ORDER — HYDROCODONE-ACETAMINOPHEN 5-325 MG PO TABS: 1.0000 | ORAL_TABLET | ORAL | Status: DC | PRN

## 2015-01-10 NOTE — ED Provider Notes (Signed)
CSN: [id number]     Arrival date & time 01/10/15  9562 History   First MD Initiated Contact with Patient 01/10/15 1006     Chief Complaint  Patient presents with  . Flank Pain     (Consider location/radiation/quality/duration/timing/severity/associated sxs/prior Treatment) HPI Cressie A Ferron is a 29 y.o. female with history of anxiety, ovarian cysts, bicornuate uterus, presents to emergency department complaining of left flank pain and left abdominal pain. She states her symptoms started several days ago with a urinary frequency, dysuria, urgency. Last night she states that she started having left flank pain, nausea, vomiting. States pain is worse with movement, palpation of the side. She states she had normal bowel movement yesterday. She continues to have urinary symptoms, and states noticed some hematuria yesterday and today. She admits to persistent nausea and vomiting, unable to keep anything down. She denies any fever or chills. No history of the same. No history of kidney stones. She tried to take ibuprofen at home with no relief.   Past Medical History  Diagnosis Date  . Depression   . Anxiety   . History of ovarian cyst   . Bartholin cyst LEFT SIDE  . Bicornuate uterus S/P EXCISION RIGHT UTERUS HORN 2007  . GERD (gastroesophageal reflux disease)    Past Surgical History  Procedure Laterality Date  . Laparoscopic appendectomy and right ovarian cystectomy  09-04-2004  . Laparoscopic excision hypoplastic right uterine horn and right salpingoophectomy  03-18-2006  . Bartholin cyst marsupialization  07/18/2012    Procedure: BARTHOLIN CYST MARSUPIALIZATION;  Surgeon: Ok Edwards, MD;  Location: [facility];  Service: Gynecology;  Laterality: Left;  Left Bartholin Duct Marsupilization. One hour.  Thanks!   Family History  Problem Relation Age of Onset  . Diabetes Mother   . Hyperlipidemia Mother   . Hypertension Maternal Grandmother   . Heart disease Maternal  Grandmother   . Diabetes Maternal Grandfather   . Hypertension Maternal Grandfather   . Hyperlipidemia Maternal Grandfather   . Breast cancer Paternal Grandmother    History  Substance Use Topics  . Smoking status: Current Every Day Smoker -- 0.50 packs/day    Types: Cigarettes  . Smokeless tobacco: Never Used  . Alcohol Use: No   OB History    Gravida Para Term Preterm AB TAB SAB Ectopic Multiple Living   0              Review of Systems  Constitutional: Negative for fever and chills.  Respiratory: Negative for cough, chest tightness and shortness of breath.   Cardiovascular: Negative for chest pain, palpitations and leg swelling.  Gastrointestinal: Positive for nausea, vomiting and abdominal pain. Negative for diarrhea and constipation.  Genitourinary: Positive for dysuria, urgency, frequency, hematuria and flank pain. Negative for vaginal bleeding, vaginal discharge, vaginal pain and pelvic pain.  Musculoskeletal: Negative for myalgias, arthralgias, neck pain and neck stiffness.  Skin: Negative for rash.  Neurological: Negative for dizziness, weakness and headaches.  All other systems reviewed and are negative.     Allergies  Review of patient's allergies indicates no known allergies.  Home Medications   Prior to Admission medications   Medication Sig Start Date End Date Taking? Authorizing Provider  ALPRAZolam Prudy Feeler) 1 MG tablet Take 1 mg by mouth 2 (two) times daily as needed for anxiety.   Yes Historical Provider, MD  Brexpiprazole (REXULTI) 1 MG TABS Take 1 tablet by mouth at bedtime.   Yes Historical Provider, MD  buPROPion (WELLBUTRIN XL)  150 MG 24 hr tablet Take 150 mg by mouth daily.   Yes Historical Provider, MD  desvenlafaxine (PRISTIQ) 50 MG 24 hr tablet Take 50 mg by mouth daily.   Yes Historical Provider, MD  diazepam (VALIUM) 10 MG tablet Take 10 mg by mouth as needed for anxiety.   Yes Historical Provider, MD  traZODone (DESYREL) 100 MG tablet Take 100  mg by mouth at bedtime.   Yes Historical Provider, MD  triamcinolone (NASACORT ALLERGY 24HR) 55 MCG/ACT AERO nasal inhaler Place 2 sprays into both nostrils daily.   Yes Historical Provider, MD  HYDROcodone-acetaminophen (NORCO/VICODIN) 5-325 MG per tablet Take 1-2 tablets by mouth every 6 (six) hours as needed for severe pain. Patient not taking: Reported on 12/02/2014 11/13/14   Victorino Dike L Piepenbrink, PA-C   BP 103/62 mmHg  Pulse 87  Temp(Src) 97.7 F (36.5 C)  Resp 17  Ht 5\' 5"  (1.651 m)  Wt 140 lb (63.504 kg)  BMI 23.30 kg/m2  SpO2 99%  LMP 12/11/2014 Physical Exam  Constitutional: She appears well-developed and well-nourished. No distress.  HENT:  Head: Normocephalic.  Eyes: Conjunctivae are normal.  Neck: Neck supple.  Cardiovascular: Normal rate, regular rhythm and normal heart sounds.   Pulmonary/Chest: Effort normal and breath sounds normal. No respiratory distress. She has no wheezes. She has no rales.  Abdominal: Soft. Bowel sounds are normal. She exhibits no distension. There is tenderness. There is no rebound.  Left lower quadrant, left upper quadrant tenderness, left CVA tenderness.   Genitourinary:  Normal external genitalia. Normal vaginal canal. Small thin white discharge. Cervix is normal, closed. No CMT. No uterine  Tenderness.left adnexal tenderness. No masses palpated.    Musculoskeletal: She exhibits no edema.  Neurological: She is alert.  Skin: Skin is warm and dry.  Psychiatric: She has a normal mood and affect. Her behavior is normal.  Nursing note and vitals reviewed.   ED Course  Procedures (including critical care time) Labs Review Labs Reviewed  WET PREP, GENITAL - Abnormal; Notable for the following:    WBC, Wet Prep HPF POC FEW (*)    All other components within normal limits  COMPREHENSIVE METABOLIC PANEL - Abnormal; Notable for the following:    BUN <5 (*)    All other components within normal limits  GC/CHLAMYDIA PROBE AMP  CBC WITH  DIFFERENTIAL  LIPASE, BLOOD  URINALYSIS, ROUTINE W REFLEX MICROSCOPIC  POC URINE PREG, ED    Imaging Review Ct Abdomen Pelvis Wo Contrast  01/10/2015   CLINICAL DATA:  Acute left flank pain with gross hematuria.  EXAM: CT ABDOMEN AND PELVIS WITHOUT CONTRAST  TECHNIQUE: Multidetector CT imaging of the abdomen and pelvis was performed following the standard protocol without IV contrast.  COMPARISON:  CT scan of January 28, 2006.  FINDINGS: Visualized lung bases appear normal. No significant osseous abnormality is noted.  No gallstones are noted. No focal abnormality is noted in the liver, spleen or pancreas on these unenhanced images. Adrenal glands appear normal. No hydronephrosis or renal obstruction is noted. No renal or ureteral calculi are noted. There is no evidence of bowel obstruction. No abnormal fluid collection is noted. Urinary bladder and uterus appear normal. No significant adenopathy is noted. 4 cm low density is seen in left adnexal region which may represent cyst.  IMPRESSION: No hydronephrosis or renal obstruction is noted. No renal or ureteral calculi are noted.  4 cm low density is seen in left adnexal region which may represent ovarian cyst. Pelvic ultrasound  is recommended for further evaluation.   Electronically Signed   By: Roque Lias M.D.   On: 01/10/2015 13:30   US Transvaginal Non-ob  01/10/2015   CLINICAL DATA:  Left adnexal mass. Left lower quadrant pain. Previous right oophorectomy.  EXAM: TRANSABDOMINAL AND TRANSVAGINAL ULTRASOUND OF PELVIS  DOPPLER ULTRASOUND OF OVARIES  TECHNIQUE: Both transabdominal and transvaginal ultrasound examinations of the pelvis were performed. Transabdominal technique was performed for global imaging of the pelvis including uterus, ovaries, adnexal regions, and pelvic cul-de-sac.  It was necessary to proceed with endovaginal exam following the transabdominal exam to visualize the left adnexa. Color and duplex Doppler ultrasound was utilized to  evaluate blood flow to the ovaries.  COMPARISON:  CT scan dated 01/10/2015 and pelvic ultrasound dated 11/13/2014  FINDINGS: Uterus  Measurements: 5.8 x 2.4 x 4.4 cm. No fibroids or other mass visualized.  Endometrium  Thickness: 7.4 mm.  No focal abnormality visualized.  Right ovary  Removed.  Left ovary  Measurements: 3.5 x 2.3 x 3.3 cm complex cyst in the otherwise normal-appearing left ovary. Overall size is 5.4 x 2.9 x 2.6 cm.  Pulsed Doppler evaluation of both ovaries demonstrates normal low-resistance arterial and venous waveforms.  Other findings  No free fluid.  IMPRESSION: Complex cyst on the left ovary, not present on 11/13/2014. I think this represents a hemorrhagic cyst. I recommend a follow-up transvaginal pelvic ultrasound and 8-12 weeks.   Electronically Signed   By: Geanie Cooley M.D.   On: 01/10/2015 15:01   US Pelvis Complete  01/10/2015   CLINICAL DATA:  Left adnexal mass. Left lower quadrant pain. Previous right oophorectomy.  EXAM: TRANSABDOMINAL AND TRANSVAGINAL ULTRASOUND OF PELVIS  DOPPLER ULTRASOUND OF OVARIES  TECHNIQUE: Both transabdominal and transvaginal ultrasound examinations of the pelvis were performed. Transabdominal technique was performed for global imaging of the pelvis including uterus, ovaries, adnexal regions, and pelvic cul-de-sac.  It was necessary to proceed with endovaginal exam following the transabdominal exam to visualize the left adnexa. Color and duplex Doppler ultrasound was utilized to evaluate blood flow to the ovaries.  COMPARISON:  CT scan dated 01/10/2015 and pelvic ultrasound dated 11/13/2014  FINDINGS: Uterus  Measurements: 5.8 x 2.4 x 4.4 cm. No fibroids or other mass visualized.  Endometrium  Thickness: 7.4 mm.  No focal abnormality visualized.  Right ovary  Removed.  Left ovary  Measurements: 3.5 x 2.3 x 3.3 cm complex cyst in the otherwise normal-appearing left ovary. Overall size is 5.4 x 2.9 x 2.6 cm.  Pulsed Doppler evaluation of both ovaries  demonstrates normal low-resistance arterial and venous waveforms.  Other findings  No free fluid.  IMPRESSION: Complex cyst on the left ovary, not present on 11/13/2014. I think this represents a hemorrhagic cyst. I recommend a follow-up transvaginal pelvic ultrasound and 8-12 weeks.   Electronically Signed   By: Geanie Cooley M.D.   On: 01/10/2015 15:01   [place] Art/ven Flow Abd Pelv Doppler  01/10/2015   CLINICAL DATA:  Left adnexal mass. Left lower quadrant pain. Previous right oophorectomy.  EXAM: TRANSABDOMINAL AND TRANSVAGINAL ULTRASOUND OF PELVIS  DOPPLER ULTRASOUND OF OVARIES  TECHNIQUE: Both transabdominal and transvaginal ultrasound examinations of the pelvis were performed. Transabdominal technique was performed for global imaging of the pelvis including uterus, ovaries, adnexal regions, and pelvic cul-de-sac.  It was necessary to proceed with endovaginal exam following the transabdominal exam to visualize the left adnexa. Color and duplex Doppler ultrasound was utilized to evaluate blood flow to the ovaries.  COMPARISON:  CT scan dated 01/10/2015 and pelvic ultrasound dated 11/13/2014  FINDINGS: Uterus  Measurements: 5.8 x 2.4 x 4.4 cm. No fibroids or other mass visualized.  Endometrium  Thickness: 7.4 mm.  No focal abnormality visualized.  Right ovary  Removed.  Left ovary  Measurements: 3.5 x 2.3 x 3.3 cm complex cyst in the otherwise normal-appearing left ovary. Overall size is 5.4 x 2.9 x 2.6 cm.  Pulsed Doppler evaluation of both ovaries demonstrates normal low-resistance arterial and venous waveforms.  Other findings  No free fluid.  IMPRESSION: Complex cyst on the left ovary, not present on 11/13/2014. I think this represents a hemorrhagic cyst. I recommend a follow-up transvaginal pelvic ultrasound and 8-12 weeks.   Electronically Signed   By: Geanie Cooley M.D.   On: 01/10/2015 15:01     EKG Interpretation None      MDM   Final diagnoses:  Flank pain  Adnexal mass  Complex ovarian cyst     Patient with left flank pain and left lower quadrant pain reports urinary frequency, dysuria, urgency. Denies any abnormal vaginal discharge or bleeding. History of ovarian cyst. Patient on exam has left CVA tenderness, left lower quadrant tenderness. We'll do pelvic exam, get labs, urinalysis ordered.   Your pregnancy negative. UA normal. Patient continues to have severe pain. CT abdomen and pelvis ordered to rule out kidney stone.   CT abdomen and pelvis negative. Patient did have left adnexal tenderness exam, ultrasound obtained, no torsion, showed complex left ovarian cyst. Discussed results with patient. Will discharge home with Norco, ibuprofen, patient has a GYN appointment on 20th. Instructed to follow-up then. Return if any worsening symptoms.  Filed Vitals:   01/10/15 1224 01/10/15 1300 01/10/15 1330 01/10/15 1500  BP: 103/59 93/57 94/56  112/59  Pulse: 91 76 73 81  Temp:      Resp: 18     Height:      Weight:      SpO2: 94% 100% 96% 99%     Lottie Mussel, PA-C 01/10/15 1607  Ethelda Chick, MD 01/10/15 [phone]

## 2015-01-10 NOTE — ED Notes (Signed)
Pt back from [place], PA at bedside to do pelvic

## 2015-01-10 NOTE — ED Notes (Signed)
Pt back from US

## 2015-01-10 NOTE — ED Notes (Signed)
CT called and stated that they would be here asap

## 2015-01-10 NOTE — ED Notes (Signed)
Pt having left flank pain and back pain since last night. sts vomiting. sts hurts with palpation

## 2015-01-10 NOTE — Discharge Instructions (Signed)
Ibuprofen for pain. norco for severe pain. Your ultrasound shows complex cyst on left ovary. This will need to be rechecked with repeat ultrasound in 8-12 wks. Please follow up with your GYN as scheduled.   Ovarian Cyst An ovarian cyst is a fluid-filled sac that forms on an ovary. The ovaries are small organs that produce eggs in women. Various types of cysts can form on the ovaries. Most are not cancerous. Many do not cause problems, and they often go away on their own. Some may cause symptoms and require treatment. Common types of ovarian cysts include:  Functional cysts--These cysts may occur every month during the menstrual cycle. This is normal. The cysts usually go away with the next menstrual cycle if the woman does not get pregnant. Usually, there are no symptoms with a functional cyst.  Endometrioma cysts--These cysts form from the tissue that lines the uterus. They are also called "chocolate cysts" because they become filled with blood that turns brown. This type of cyst can cause pain in the lower abdomen during intercourse and with your menstrual period.  Cystadenoma cysts--This type develops from the cells on the outside of the ovary. These cysts can get very big and cause lower abdomen pain and pain with intercourse. This type of cyst can twist on itself, cut off its blood supply, and cause severe pain. It can also easily rupture and cause a lot of pain.  Dermoid cysts--This type of cyst is sometimes found in both ovaries. These cysts may contain different kinds of body tissue, such as skin, teeth, hair, or cartilage. They usually do not cause symptoms unless they get very big.  Theca lutein cysts--These cysts occur when too much of a certain hormone (human chorionic gonadotropin) is produced and overstimulates the ovaries to produce an egg. This is most common after procedures used to assist with the conception of a baby (in vitro fertilization). CAUSES   Fertility drugs can cause a  condition in which multiple large cysts are formed on the ovaries. This is called ovarian hyperstimulation syndrome.  A condition called polycystic ovary syndrome can cause hormonal imbalances that can lead to nonfunctional ovarian cysts. SIGNS AND SYMPTOMS  Many ovarian cysts do not cause symptoms. If symptoms are present, they may include:  Pelvic pain or pressure.  Pain in the lower abdomen.  Pain during sexual intercourse.  Increasing girth (swelling) of the abdomen.  Abnormal menstrual periods.  Increasing pain with menstrual periods.  Stopping having menstrual periods without being pregnant. DIAGNOSIS  These cysts are commonly found during a routine or annual pelvic exam. Tests may be ordered to find out more about the cyst. These tests may include:  Ultrasound.  X-ray of the pelvis.  CT scan.  MRI.  Blood tests. TREATMENT  Many ovarian cysts go away on their own without treatment. Your health care provider may want to check your cyst regularly for 2-3 months to see if it changes. For women in menopause, it is particularly important to monitor a cyst closely because of the higher rate of ovarian cancer in menopausal women. When treatment is needed, it may include any of the following:  A procedure to drain the cyst (aspiration). This may be done using a long needle and ultrasound. It can also be done through a laparoscopic procedure. This involves using a thin, lighted tube with a tiny camera on the end (laparoscope) inserted through a small incision.  Surgery to remove the whole cyst. This may be done using laparoscopic surgery  or an open surgery involving a larger incision in the lower abdomen.  Hormone treatment or birth control pills. These methods are sometimes used to help dissolve a cyst. HOME CARE INSTRUCTIONS   Only take over-the-counter or prescription medicines as directed by your health care provider.  Follow up with your health care provider as  directed.  Get regular pelvic exams and Pap tests. SEEK MEDICAL CARE IF:   Your periods are late, irregular, or painful, or they stop.  Your pelvic pain or abdominal pain does not go away.  Your abdomen becomes larger or swollen.  You have pressure on your bladder or trouble emptying your bladder completely.  You have pain during sexual intercourse.  You have feelings of fullness, pressure, or discomfort in your stomach.  You lose weight for no apparent reason.  You feel generally ill.  You become constipated.  You lose your appetite.  You develop acne.  You have an increase in body and facial hair.  You are gaining weight, without changing your exercise and eating habits.  You think you are pregnant. SEEK IMMEDIATE MEDICAL CARE IF:   You have increasing abdominal pain.  You feel sick to your stomach (nauseous), and you throw up (vomit).  You develop a fever that comes on suddenly.  You have abdominal pain during a bowel movement.  Your menstrual periods become heavier than usual. MAKE SURE YOU:  Understand these instructions.  Will watch your condition.  Will get help right away if you are not doing well or get worse. Document Released: 12/17/2005 Document Revised: 12/22/2013 Document Reviewed: 08/24/2013 [facility] Patient Information 2015 [place], [place]. This information is not intended to replace advice given to you by your health care provider. Make sure you discuss any questions you have with your health care provider.

## 2015-01-10 NOTE — ED Notes (Signed)
Pelvic cart ready and at bedside.  

## 2015-01-10 NOTE — ED Notes (Signed)
PA at bedside to discuss plan of care

## 2015-01-10 NOTE — ED Notes (Signed)
Pt resting at this time. States pain is coming back 8/10. Pt states "fentanyl did not do anything. The dilaudid did work, but it didn't last long."

## 2015-01-11 LAB — GC/CHLAMYDIA PROBE AMP
CT Probe RNA: NEGATIVE
GC Probe RNA: NEGATIVE

## 2015-01-27 ENCOUNTER — Encounter (HOSPITAL_COMMUNITY): Payer: Self-pay | Admitting: *Deleted

## 2015-02-07 NOTE — H&P (Signed)
[name] is an 29 y.o. female with recurrent Bartholins cyst.  She has had multiple I&Ds and marsupialization on the left Bartholins. She desires definitive management.    Pertinent Gynecological History: Menses: flow is moderate Bleeding: dysfunctional uterine bleeding Contraception: same sex partner DES exposure: unknown Blood transfusions: none Sexually transmitted diseases: no past history Previous GYN Procedures: laparoscopic RSO and removal of rudimentary horn, marsupialization  Last mammogram: n/a Date: n/a Last pap: normal Date: ? OB History: G0   Menstrual History: Menarche age: n/a Patient's last menstrual period was 01/12/2015 (approximate).    Past Medical History  Diagnosis Date  . Depression   . Anxiety   . History of ovarian cyst   . Bartholin cyst LEFT SIDE  . Bicornuate uterus S/P EXCISION RIGHT UTERUS HORN 2007  . GERD (gastroesophageal reflux disease)     diet controlled - no meds  . Headache     sleep, xanax    Past Surgical History  Procedure Laterality Date  . Laparoscopic appendectomy and right ovarian cystectomy  09-04-2004  . Laparoscopic excision hypoplastic right uterine horn and right salpingoophectomy  03-18-2006  . Bartholin cyst marsupialization  07/18/2012    Procedure: BARTHOLIN CYST MARSUPIALIZATION;  Surgeon: Ok Edwards, MD;  Location: [facility];  Service: Gynecology;  Laterality: Left;  Left Bartholin Duct Marsupilization. One hour.  Thanks!  . Diagnostic laparoscopy    . Appendectomy    . Wisdom tooth extraction      Family History  Problem Relation Age of Onset  . Diabetes Mother   . Hyperlipidemia Mother   . Hypertension Maternal Grandmother   . Heart disease Maternal Grandmother   . Diabetes Maternal Grandfather   . Hypertension Maternal Grandfather   . Hyperlipidemia Maternal Grandfather   . Breast cancer Paternal Grandmother     Social History:  reports that she has been smoking Cigarettes.   She has a 6 pack-year smoking history. She has never used smokeless tobacco. She reports that she does not drink alcohol or use illicit drugs.  Allergies: No Known Allergies  No prescriptions prior to admission    ROS  Height  (1.626 m), weight 140 lb (63.504 kg), last menstrual period 01/12/2015. Physical Exam  Constitutional: She is oriented to person, place, and time. She appears well-developed and well-nourished.  GI: Soft. There is no rebound and no guarding.  Neurological: She is alert and oriented to person, place, and time.  Skin: Skin is warm and dry.  Psychiatric: She has a normal mood and affect. Her behavior is normal.    No results found for this or any previous visit (from the past 24 hour(s)).  No results found.  Assessment/Plan: 28yo with recurrent left Bartholins cyst -Left bartholins cyst excision  Bennet Kujawa 02/07/2015, 10:06 PM

## 2015-02-11 ENCOUNTER — Ambulatory Visit (HOSPITAL_COMMUNITY): Payer: No Typology Code available for payment source | Admitting: Anesthesiology

## 2015-02-11 ENCOUNTER — Ambulatory Visit (HOSPITAL_COMMUNITY)
Admission: RE | Admit: 2015-02-11 | Discharge: 2015-02-11 | Disposition: A | Discharge status: Home / Self Care | Payer: No Typology Code available for payment source | Source: Ambulatory Visit | Attending: Obstetrics & Gynecology | Admitting: Obstetrics & Gynecology

## 2015-02-11 ENCOUNTER — Encounter (HOSPITAL_COMMUNITY): Payer: Self-pay | Admitting: *Deleted

## 2015-02-11 ENCOUNTER — Encounter (HOSPITAL_COMMUNITY)
Admission: RE | Disposition: A | Discharge status: Home / Self Care | Payer: Self-pay | Source: Ambulatory Visit | Attending: Obstetrics & Gynecology

## 2015-02-11 DIAGNOSIS — F329 Major depressive disorder, single episode, unspecified: Secondary | ICD-10-CM | POA: Diagnosis not present

## 2015-02-11 DIAGNOSIS — Z9889 Other specified postprocedural states: Secondary | ICD-10-CM | POA: Diagnosis not present

## 2015-02-11 DIAGNOSIS — F17210 Nicotine dependence, cigarettes, uncomplicated: Secondary | ICD-10-CM | POA: Insufficient documentation

## 2015-02-11 DIAGNOSIS — K219 Gastro-esophageal reflux disease without esophagitis: Secondary | ICD-10-CM | POA: Insufficient documentation

## 2015-02-11 DIAGNOSIS — N750 Cyst of Bartholin's gland: Secondary | ICD-10-CM | POA: Diagnosis present

## 2015-02-11 HISTORY — DX: Headache, unspecified: R51.9

## 2015-02-11 HISTORY — DX: Headache: R51

## 2015-02-11 HISTORY — PX: BARTHOLIN GLAND CYST EXCISION: SHX565

## 2015-02-11 LAB — CBC
HCT: 39.8 % (ref 36.0–46.0)
Hemoglobin: 13 g/dL (ref 12.0–15.0)
MCH: 32.9 pg (ref 26.0–34.0)
MCHC: 32.7 g/dL (ref 30.0–36.0)
MCV: 100.8 fL — ABNORMAL HIGH (ref 78.0–100.0)
PLATELETS: 225 10*3/uL (ref 150–400)
RBC: 3.95 MIL/uL (ref 3.87–5.11)
RDW: 12.7 % (ref 11.5–15.5)
WBC: 7.5 10*3/uL (ref 4.0–10.5)

## 2015-02-11 LAB — PREGNANCY, URINE: PREG TEST UR: NEGATIVE

## 2015-02-11 SURGERY — EXCISION, BARTHOLIN'S GLAND
Anesthesia: General | Site: Vagina | Laterality: Left

## 2015-02-11 MED ORDER — DEXAMETHASONE SODIUM PHOSPHATE 4 MG/ML IJ SOLN
INTRAMUSCULAR | Status: AC
  Filled 2015-02-11: qty 1

## 2015-02-11 MED ORDER — LACTATED RINGERS IV SOLN
INTRAVENOUS | Status: DC
  Administered 2015-02-11 (×2): via INTRAVENOUS

## 2015-02-11 MED ORDER — SCOPOLAMINE 1 MG/3DAYS TD PT72
MEDICATED_PATCH | TRANSDERMAL | Status: AC
  Administered 2015-02-11: 1.5 mg via TRANSDERMAL
  Filled 2015-02-11: qty 1

## 2015-02-11 MED ORDER — DEXTROSE IN LACTATED RINGERS 5 % IV SOLN: INTRAVENOUS | Status: DC

## 2015-02-11 MED ORDER — IBUPROFEN 800 MG PO TABS: 800.0000 mg | ORAL_TABLET | Freq: Three times a day (TID) | ORAL | Status: DC | PRN

## 2015-02-11 MED ORDER — MIDAZOLAM HCL 2 MG/2ML IJ SOLN: 0.5000 mg | Freq: Once | INTRAMUSCULAR | Status: DC | PRN

## 2015-02-11 MED ORDER — PROPOFOL 10 MG/ML IV BOLUS
INTRAVENOUS | Status: AC
  Filled 2015-02-11: qty 20

## 2015-02-11 MED ORDER — PROPOFOL 10 MG/ML IV BOLUS
INTRAVENOUS | Status: DC | PRN
  Administered 2015-02-11: 200 mg via INTRAVENOUS

## 2015-02-11 MED ORDER — FENTANYL CITRATE 0.05 MG/ML IJ SOLN
INTRAMUSCULAR | Status: AC
  Filled 2015-02-11: qty 2

## 2015-02-11 MED ORDER — FAMOTIDINE IN NACL 20-0.9 MG/50ML-% IV SOLN
20.0000 mg | Freq: Once | INTRAVENOUS | Status: AC
Start: 2015-02-11 — End: 2015-02-11
  Administered 2015-02-11: 20 mg via INTRAVENOUS

## 2015-02-11 MED ORDER — SODIUM CHLORIDE 0.9 % IJ SOLN
INTRAMUSCULAR | Status: AC
  Filled 2015-02-11: qty 20

## 2015-02-11 MED ORDER — FENTANYL CITRATE 0.05 MG/ML IJ SOLN
25.0000 ug | INTRAMUSCULAR | Status: DC | PRN
  Administered 2015-02-11 (×2): 50 ug via INTRAVENOUS

## 2015-02-11 MED ORDER — FENTANYL CITRATE 0.05 MG/ML IJ SOLN
INTRAMUSCULAR | Status: AC
  Administered 2015-02-11: 50 ug via INTRAVENOUS
  Filled 2015-02-11: qty 2

## 2015-02-11 MED ORDER — BUPIVACAINE LIPOSOME 1.3 % IJ SUSP
20.0000 mL | Freq: Once | INTRAMUSCULAR | Status: AC
  Administered 2015-02-11: 22 mL
  Filled 2015-02-11: qty 20

## 2015-02-11 MED ORDER — ONDANSETRON HCL 4 MG/2ML IJ SOLN
INTRAMUSCULAR | Status: DC | PRN
Start: 2015-02-11 — End: 2015-02-11
  Administered 2015-02-11: 4 mg via INTRAVENOUS

## 2015-02-11 MED ORDER — LIDOCAINE HCL (CARDIAC) 20 MG/ML IV SOLN
INTRAVENOUS | Status: DC | PRN
  Administered 2015-02-11: 50 mg via INTRAVENOUS

## 2015-02-11 MED ORDER — FENTANYL CITRATE 0.05 MG/ML IJ SOLN
INTRAMUSCULAR | Status: DC | PRN
  Administered 2015-02-11: 25 ug via INTRAVENOUS
  Administered 2015-02-11: 50 ug via INTRAVENOUS
  Administered 2015-02-11: 25 ug via INTRAVENOUS

## 2015-02-11 MED ORDER — LIDOCAINE HCL (CARDIAC) 20 MG/ML IV SOLN
INTRAVENOUS | Status: AC
  Filled 2015-02-11: qty 5

## 2015-02-11 MED ORDER — CEFAZOLIN SODIUM-DEXTROSE 2-3 GM-% IV SOLR
2.0000 g | INTRAVENOUS | Status: AC
  Administered 2015-02-11: 2 g via INTRAVENOUS

## 2015-02-11 MED ORDER — SCOPOLAMINE 1 MG/3DAYS TD PT72
1.0000 | MEDICATED_PATCH | TRANSDERMAL | Status: DC
  Administered 2015-02-11: 1.5 mg via TRANSDERMAL

## 2015-02-11 MED ORDER — BUPIVACAINE HCL (PF) 0.25 % IJ SOLN
INTRAMUSCULAR | Status: AC
  Filled 2015-02-11: qty 30

## 2015-02-11 MED ORDER — FAMOTIDINE IN NACL 20-0.9 MG/50ML-% IV SOLN
INTRAVENOUS | Status: AC
  Administered 2015-02-11: 20 mg via INTRAVENOUS
  Filled 2015-02-11: qty 50

## 2015-02-11 MED ORDER — CEFAZOLIN SODIUM-DEXTROSE 2-3 GM-% IV SOLR
INTRAVENOUS | Status: AC
  Filled 2015-02-11: qty 50

## 2015-02-11 MED ORDER — KETOROLAC TROMETHAMINE 30 MG/ML IJ SOLN: 30.0000 mg | Freq: Once | INTRAMUSCULAR | Status: DC | PRN

## 2015-02-11 MED ORDER — ACETAMINOPHEN 325 MG PO TABS: 325.0000 mg | ORAL_TABLET | ORAL | Status: DC | PRN

## 2015-02-11 MED ORDER — MEPERIDINE HCL 25 MG/ML IJ SOLN: 6.2500 mg | INTRAMUSCULAR | Status: DC | PRN

## 2015-02-11 MED ORDER — MIDAZOLAM HCL 2 MG/2ML IJ SOLN
INTRAMUSCULAR | Status: AC
  Filled 2015-02-11: qty 2

## 2015-02-11 MED ORDER — ONDANSETRON HCL 4 MG/2ML IJ SOLN
INTRAMUSCULAR | Status: AC
  Filled 2015-02-11: qty 2

## 2015-02-11 MED ORDER — OXYCODONE-ACETAMINOPHEN 5-325 MG PO TABS
1.0000 | ORAL_TABLET | ORAL | Status: DC | PRN
  Administered 2015-02-11: 1 via ORAL

## 2015-02-11 MED ORDER — ACETAMINOPHEN 160 MG/5ML PO SOLN: 325.0000 mg | ORAL | Status: DC | PRN

## 2015-02-11 MED ORDER — OXYCODONE-ACETAMINOPHEN 5-325 MG PO TABS
ORAL_TABLET | ORAL | Status: AC
  Filled 2015-02-11: qty 1

## 2015-02-11 MED ORDER — DEXAMETHASONE SODIUM PHOSPHATE 4 MG/ML IJ SOLN
INTRAMUSCULAR | Status: DC | PRN
  Administered 2015-02-11: 4 mg via INTRAVENOUS

## 2015-02-11 MED ORDER — MIDAZOLAM HCL 5 MG/5ML IJ SOLN
INTRAMUSCULAR | Status: DC | PRN
  Administered 2015-02-11: 2 mg via INTRAVENOUS

## 2015-02-11 MED ORDER — PROMETHAZINE HCL 25 MG/ML IJ SOLN: 6.2500 mg | INTRAMUSCULAR | Status: DC | PRN

## 2015-02-11 MED ORDER — OXYCODONE-ACETAMINOPHEN 7.5-325 MG PO TABS: 1.0000 | ORAL_TABLET | ORAL | Status: DC | PRN

## 2015-02-11 SURGICAL SUPPLY — 29 items
BLADE SURG 15 STRL LF C SS BP (BLADE) ×1 IMPLANT
BLADE SURG 15 STRL SS (BLADE) ×1
CLOTH BEACON ORANGE TIMEOUT ST (SAFETY) ×2 IMPLANT
CONTAINER PREFILL 10% NBF 15ML (MISCELLANEOUS) IMPLANT
COUNTER NEEDLE 1200 MAGNETIC (NEEDLE) IMPLANT
DRAIN PENROSE 1/4X12 LTX (DRAIN) IMPLANT
ELECT REM PT RETURN 9FT ADLT (ELECTROSURGICAL)
ELECTRODE REM PT RTRN 9FT ADLT (ELECTROSURGICAL) IMPLANT
GAUZE IODOFORM PACK 1/2 7832 (GAUZE/BANDAGES/DRESSINGS) IMPLANT
GAUZE PACKING 1/4X5YD (GAUZE/BANDAGES/DRESSINGS) IMPLANT
GLOVE BIO SURGEON STRL SZ 6 (GLOVE) ×2 IMPLANT
GLOVE BIOGEL PI IND STRL 6 (GLOVE) ×2 IMPLANT
GLOVE BIOGEL PI INDICATOR 6 (GLOVE) ×2
GOWN STRL REUS W/TWL LRG LVL3 (GOWN DISPOSABLE) ×4 IMPLANT
NEEDLE HYPO 25X1 1.5 SAFETY (NEEDLE) ×2 IMPLANT
NS IRRIG 1000ML POUR BTL (IV SOLUTION) ×2 IMPLANT
PACK VAGINAL MINOR WOMEN LF (CUSTOM PROCEDURE TRAY) ×2 IMPLANT
PAD OB MATERNITY 4.3X12.25 (PERSONAL CARE ITEMS) ×2 IMPLANT
PAD PREP 24X48 CUFFED NSTRL (MISCELLANEOUS) ×2 IMPLANT
PENCIL BUTTON HOLSTER BLD 10FT (ELECTRODE) IMPLANT
SUT VIC AB 3-0 PS2 18 (SUTURE) ×3
SUT VIC AB 3-0 PS2 18XBRD (SUTURE) ×3 IMPLANT
SUT VICRYL 0 UR6 27IN ABS (SUTURE) IMPLANT
SWAB COLLECTION DEVICE MRSA (MISCELLANEOUS) IMPLANT
TOWEL OR 17X24 6PK STRL BLUE (TOWEL DISPOSABLE) ×4 IMPLANT
TUBE ANAEROBIC SPECIMEN COL (MISCELLANEOUS) IMPLANT
TUBING NON-CON 1/4 X 20 CONN (TUBING) IMPLANT
WATER STERILE IRR 1000ML POUR (IV SOLUTION) ×2 IMPLANT
YANKAUER SUCT BULB TIP NO VENT (SUCTIONS) IMPLANT

## 2015-02-11 NOTE — Progress Notes (Signed)
No change to H&P.  Letesha Klecker, DO 

## 2015-02-11 NOTE — Op Note (Signed)
Weyrauch, [name] PROCEDURE DATE: 02/11/2015  PREOPERATIVE DIAGNOSIS: Recurrent left Bartholin's Gland cyst  POSTOPERATIVE DIAGNOSIS: The same  PROCEDURE: Excision of left Bartholin's cyst and gland  SURGEON: Dr. Mitchel Honour  INDICATIONS: [name] is a 30 y.o. G0 with left Bartholin's gland cyst. Patient has h/o multiple failed I&D and marsupialization, now with recurrence. Pt needs definitive treatment with removal of left bartholin's gland.  FINDINGS: 3-4 cm indurated left Bartholin's gland cyst .  ANESTHESIA: General   ESTIMATED BLOOD LOSS: 300cc  SPECIMENS: Left Bartholin's gland and cyst  COMPLICATIONS: None immediate  PROCEDURE IN DETAIL:  Enlarged gland palpated in front of the hymenal ring around 5 o' clock. Written informed consent was obtained. Discussed complications and possible outcomes of procedure including recurrence of cyst, scarring leading to infecton, bleeding, dyspareunia, distortion of anatomy. Patient was examined in the dorsal lithotomy position and mass was identified. The area was prepped in the normal sterile fashion and draped in a sterile manner. A 3 cm incision was made using a sterile scapel. Using blunt and sharp dissection, the left bartholin's cyst and gland were removed. Bovie cautery was used to maintain hemostasis. A vascular pedicle at the gland base was clamped and tied used 3-0 Vicryl. Copious irrigation was performed and continued hemostasis was noted. Interrupted 3-0 Vicryl sutures were placed to closed the dead space. The mucosal surface was also closed with 3-0 Vicryl interrupted stitches. There was hemostasis at the end of the procedure. The patient tolerated the procedure well. All counts were correct x 2. The patient went to the recovery room in stable condition.

## 2015-02-11 NOTE — Transfer of Care (Signed)
Immediate Anesthesia Transfer of Care Note  Patient: [name]  Procedure(s) Performed: Procedure(s): BARTHOLIN GLAND EXCISION (Left)  Patient Location: PACU  Anesthesia Type:General  Level of Consciousness: awake, alert  and oriented  Airway & Oxygen Therapy: Patient Spontanous Breathing and Patient connected to nasal cannula oxygen  Post-op Assessment: Report given to RN  Post vital signs: Reviewed  Last Vitals:  Filed Vitals:   02/11/15 0611  BP: 110/65  Pulse: 71  Temp: 36.7 C  Resp: 16    Complications: No apparent anesthesia complications

## 2015-02-11 NOTE — Anesthesia Preprocedure Evaluation (Signed)
Anesthesia Evaluation  Patient identified by MRN, date of birth, ID band Patient awake    Reviewed: Allergy & Precautions, H&P , Patient's Chart, lab work & pertinent test results, reviewed documented beta blocker date and time   History of Anesthesia Complications Negative for: history of anesthetic complications  Airway Mallampati: II TM Distance: >3 FB Neck ROM: full    Dental   Pulmonary Current Smoker,  breath sounds clear to auscultation        Cardiovascular Exercise Tolerance: Good Rhythm:regular Rate:Normal     Neuro/Psych negative psych ROS   GI/Hepatic GERD-  Controlled,  Endo/Other    Renal/GU      Musculoskeletal   Abdominal   Peds  Hematology   Anesthesia Other Findings   Reproductive/Obstetrics                          Anesthesia Physical Anesthesia Plan  ASA: II  Anesthesia Plan: General LMA   Post-op Pain Management:    Induction:   Airway Management Planned:   Additional Equipment:   Intra-op Plan:   Post-operative Plan:   Informed Consent: I have reviewed the patients History and Physical, chart, labs and discussed the procedure including the risks, benefits and alternatives for the proposed anesthesia with the patient or authorized representative who has indicated his/her understanding and acceptance.   Dental Advisory Given  Plan Discussed with: CRNA, Surgeon and Anesthesiologist  Anesthesia Plan Comments:         Anesthesia Quick Evaluation  

## 2015-02-11 NOTE — Discharge Instructions (Signed)
Call MD for T>100.4, heavy vaginal bleeding, uncontrolled pain, inability to void, or respiratory distress.  Call office to schedule postop appointment in 1 week.  No driving while taking narcotics.  Sitz baths twice daily starting tomorrow.  Ice packs to perineum x 24 hours.  Pelvic rest x 6 weeks.  DISCHARGE INSTRUCTIONS: D&C / D&E The following instructions have been prepared to help you care for yourself upon your return home.   Personal hygiene:  Use sanitary pads for vaginal drainage, not tampons.  Shower the day after your procedure.  NO tub baths, pools or Jacuzzis for 2-3 weeks.  Wipe front to back after using the bathroom.  Activity and limitations:  Do NOT drive or operate any equipment for 24 hours. The effects of anesthesia are still present and drowsiness may result.  Do NOT rest in bed all day.  Walking is encouraged.  Walk up and down stairs slowly.  You may resume your normal activity in one to two days or as indicated by your physician.  Sexual activity: NO intercourse for at least 2 weeks after the procedure, or as indicated by your physician.  Diet: Eat a light meal as desired this evening. You may resume your usual diet tomorrow.  Return to work: You may resume your work activities in one to two days or as indicated by your doctor.  What to expect after your surgery: Expect to have vaginal bleeding/discharge for 2-3 days and spotting for up to 10 days. It is not unusual to have soreness for up to 1-2 weeks. You may have a slight burning sensation when you urinate for the first day. Mild cramps may continue for a couple of days. You may have a regular period in 2-6 weeks.  Call your doctor for any of the following:  Excessive vaginal bleeding, saturating and changing one pad every hour.  Inability to urinate 6 hours after discharge from hospital.  Pain not relieved by pain medication.  Fever of 100.4 F or greater.  Unusual vaginal discharge or odor.   Call for an appointment:    Patients signature: ______________________  Nurses signature ________________________  Support person's signature_______________________

## 2015-02-14 ENCOUNTER — Encounter (HOSPITAL_COMMUNITY): Payer: Self-pay | Admitting: Obstetrics & Gynecology

## 2015-02-21 NOTE — Anesthesia Postprocedure Evaluation (Signed)
  Anesthesia Post-op Note  Patient: [name]  Procedure(s) Performed: Procedure(s): BARTHOLIN GLAND EXCISION (Left) Patient is awake and responsive. Pain and nausea are reasonably well controlled. Vital signs are stable and clinically acceptable. Oxygen saturation is clinically acceptable. There are no apparent anesthetic complications at this time. Patient is ready for discharge.

## 2015-06-07 ENCOUNTER — Ambulatory Visit (INDEPENDENT_AMBULATORY_CARE_PROVIDER_SITE_OTHER): Payer: Medicaid Other | Admitting: Psychiatry

## 2015-06-07 ENCOUNTER — Encounter: Payer: Self-pay | Admitting: Psychiatry

## 2015-06-07 VITALS — BP 122/78 | HR 100 | Temp 98.7°F | Ht 64.0 in

## 2015-06-07 DIAGNOSIS — F4310 Post-traumatic stress disorder, unspecified: Secondary | ICD-10-CM | POA: Diagnosis not present

## 2015-06-07 DIAGNOSIS — F449 Dissociative and conversion disorder, unspecified: Secondary | ICD-10-CM | POA: Insufficient documentation

## 2015-06-07 DIAGNOSIS — F333 Major depressive disorder, recurrent, severe with psychotic symptoms: Secondary | ICD-10-CM
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
## 2015-06-11 ENCOUNTER — Encounter (HOSPITAL_COMMUNITY): Payer: Self-pay | Admitting: *Deleted

## 2015-06-11 ENCOUNTER — Emergency Department (HOSPITAL_COMMUNITY)
Admission: EM | Admit: 2015-06-11 | Discharge: 2015-06-11 | Disposition: A | Discharge status: Home / Self Care | Payer: Medicaid Other | Attending: Emergency Medicine | Admitting: Emergency Medicine

## 2015-06-11 DIAGNOSIS — F419 Anxiety disorder, unspecified: Secondary | ICD-10-CM | POA: Insufficient documentation

## 2015-06-11 DIAGNOSIS — F209 Schizophrenia, unspecified: Secondary | ICD-10-CM | POA: Diagnosis not present

## 2015-06-11 DIAGNOSIS — Z8742 Personal history of other diseases of the female genital tract: Secondary | ICD-10-CM | POA: Insufficient documentation

## 2015-06-11 DIAGNOSIS — Z79899 Other long term (current) drug therapy: Secondary | ICD-10-CM | POA: Insufficient documentation

## 2015-06-11 DIAGNOSIS — F1490 Cocaine use, unspecified, uncomplicated: Secondary | ICD-10-CM | POA: Diagnosis not present

## 2015-06-11 DIAGNOSIS — Y9289 Other specified places as the place of occurrence of the external cause: Secondary | ICD-10-CM | POA: Diagnosis not present

## 2015-06-11 DIAGNOSIS — Y998 Other external cause status: Secondary | ICD-10-CM | POA: Insufficient documentation

## 2015-06-11 DIAGNOSIS — Z720 Tobacco use: Secondary | ICD-10-CM | POA: Diagnosis not present

## 2015-06-11 DIAGNOSIS — Z23 Encounter for immunization: Secondary | ICD-10-CM | POA: Diagnosis not present

## 2015-06-11 DIAGNOSIS — Y9389 Activity, other specified: Secondary | ICD-10-CM | POA: Diagnosis not present

## 2015-06-11 DIAGNOSIS — Z8719 Personal history of other diseases of the digestive system: Secondary | ICD-10-CM | POA: Insufficient documentation

## 2015-06-11 DIAGNOSIS — F4310 Post-traumatic stress disorder, unspecified: Secondary | ICD-10-CM | POA: Insufficient documentation

## 2015-06-11 DIAGNOSIS — X788XXA Intentional self-harm by other sharp object, initial encounter: Secondary | ICD-10-CM | POA: Insufficient documentation

## 2015-06-11 DIAGNOSIS — F329 Major depressive disorder, single episode, unspecified: Secondary | ICD-10-CM | POA: Diagnosis not present

## 2015-06-11 DIAGNOSIS — S51812A Laceration without foreign body of left forearm, initial encounter: Secondary | ICD-10-CM | POA: Diagnosis not present

## 2015-06-11 MED ORDER — TETANUS-DIPHTH-ACELL PERTUSSIS 5-2.5-18.5 LF-MCG/0.5 IM SUSP
0.5000 mL | Freq: Once | INTRAMUSCULAR | Status: AC
  Administered 2015-06-11: 0.5 mL via INTRAMUSCULAR
  Filled 2015-06-11: qty 0.5

## 2015-06-11 MED ORDER — LIDOCAINE-EPINEPHRINE 1 %-1:100000 IJ SOLN
INTRAMUSCULAR | Status: AC
  Filled 2015-06-11: qty 1

## 2015-06-11 NOTE — ED Notes (Signed)
Pt states she is suppose to start shock treatment 2nd week of July,  She denies any thoughts of SI and HI,  States she became upset when her friends were leaving because she doesn't like to be alone and has PTSD

## 2015-06-11 NOTE — ED Notes (Signed)
Bed: WA17 Expected date:  Expected time:  Means of arrival:  Comments: EMS 
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
## 2015-06-11 NOTE — ED Provider Notes (Signed)
CSN: [id number]     Arrival date & time 06/11/15  0532 History   First MD Initiated Contact with Patient 06/11/15 [phone]     Chief Complaint  Patient presents with  . Extremity Laceration  . Drug Problem     (Consider location/radiation/quality/duration/timing/severity/associated sxs/prior Treatment) Patient is a 29 y.o. female presenting with drug problem and skin laceration.  Drug Problem Pertinent negatives include no chest pain, no abdominal pain, no headaches and no shortness of breath.  Laceration Location: left forearm. Length (cm):  9cm, 9cm Depth:  Cutaneous Quality: straight   Bleeding: controlled   Time since incident:  4 hours Injury mechanism: scalpel. Pain details:    Quality:  Aching   Severity:  Mild   Timing:  Constant   Progression:  Unchanged Foreign body present:  No foreign bodies Relieved by:  Nothing Worsened by:  Nothing tried Ineffective treatments:  None tried Tetanus status:  Out of date   Past Medical History  Diagnosis Date  . Depression   . Anxiety   . History of ovarian cyst   . Bartholin cyst LEFT SIDE  . Bicornuate uterus S/P EXCISION RIGHT UTERUS HORN 2007  . GERD (gastroesophageal reflux disease)     diet controlled - no meds  . Headache     sleep, xanax  . PTSD (post-traumatic stress disorder)   . Schizophrenia    Past Surgical History  Procedure Laterality Date  . Laparoscopic appendectomy and right ovarian cystectomy  09-04-2004  . Laparoscopic excision hypoplastic right uterine horn and right salpingoophectomy  03-18-2006  . Bartholin cyst marsupialization  07/18/2012    Procedure: BARTHOLIN CYST MARSUPIALIZATION;  Surgeon: Ok Edwards, MD;  Location: [facility];  Service: Gynecology;  Laterality: Left;  Left Bartholin Duct Marsupilization. One hour.  Thanks!  . Diagnostic laparoscopy    . Appendectomy    . Wisdom tooth extraction    . Bartholin gland cyst excision Left 02/11/2015    Procedure:  BARTHOLIN GLAND EXCISION;  Surgeon: Mitchel Honour, DO;  Location: [facility];  Service: Gynecology;  Laterality: Left;   Family History  Problem Relation Age of Onset  . Diabetes Mother   . Hyperlipidemia Mother   . Bipolar disorder Mother   . Depression Mother   . Hypertension Maternal Grandmother   . Heart disease Maternal Grandmother   . Diabetes Maternal Grandfather   . Hypertension Maternal Grandfather   . Hyperlipidemia Maternal Grandfather   . Alcohol abuse Maternal Grandfather   . Breast cancer Paternal Grandmother   . Alcohol abuse Father   . Drug abuse Maternal Aunt   . Schizophrenia Cousin    History  Substance Use Topics  . Smoking status: Current Every Day Smoker -- 0.50 packs/day for 12 years    Types: Cigarettes  . Smokeless tobacco: Never Used  . Alcohol Use: No   OB History    Gravida Para Term Preterm AB TAB SAB Ectopic Multiple Living   0              Review of Systems  Constitutional: Negative for fever and fatigue.  HENT: Negative for congestion and drooling.   Eyes: Negative for pain.  Respiratory: Negative for cough and shortness of breath.   Cardiovascular: Negative for chest pain.  Gastrointestinal: Negative for nausea, vomiting, abdominal pain and diarrhea.  Genitourinary: Negative for dysuria and hematuria.  Musculoskeletal: Negative for back pain, gait problem and neck pain.  Skin: Negative for color change.  Neurological: Negative for dizziness  and headaches.  Hematological: Negative for adenopathy.  Psychiatric/Behavioral: Positive for self-injury. Negative for suicidal ideas and behavioral problems.  All other systems reviewed and are negative.     Allergies  Hydrocodone-acetaminophen  Home Medications   Prior to Admission medications   Medication Sig Start Date End Date Taking? Authorizing Provider  ALPRAZolam Prudy Feeler) 1 MG tablet Take 1 mg by mouth 2 (two) times daily as needed for anxiety.   Yes Historical Provider, MD  alprazolam  Prudy Feeler) 2 MG tablet Take 2 mg by mouth at bedtime as needed for sleep.   Yes Historical Provider, MD  Brexpiprazole (REXULTI) 1 MG TABS Take 2 tablets by mouth at bedtime.    Yes Historical Provider, MD  buPROPion (WELLBUTRIN XL) 150 MG 24 hr tablet Take 150 mg by mouth daily.   Yes Historical Provider, MD  diazepam (VALIUM) 10 MG tablet Take 10 mg by mouth as needed for anxiety.   Yes Historical Provider, MD  eszopiclone (LUNESTA) 1 MG TABS tablet Take 1 mg by mouth at bedtime as needed for sleep. Take immediately before bedtime   Yes Historical Provider, MD  Eszopiclone 3 MG TABS Take 3 mg by mouth at bedtime. Take immediately before bedtime   Yes Historical Provider, MD  QUEtiapine (SEROQUEL XR) 300 MG 24 hr tablet Take 300 mg by mouth at bedtime.   Yes Historical Provider, MD  QUEtiapine (SEROQUEL) 300 MG tablet Take 300 mg by mouth at bedtime.   Yes Historical Provider, MD  desvenlafaxine (PRISTIQ) 50 MG 24 hr tablet Take 50 mg by mouth daily.    Historical Provider, MD  ibuprofen (ADVIL,MOTRIN) 800 MG tablet Take 1 tablet (800 mg total) by mouth every 8 (eight) hours as needed. Patient not taking: Reported on 06/07/2015 02/11/15   Mitchel Honour, DO  oxyCODONE-acetaminophen (PERCOCET) 7.5-325 MG per tablet Take 1 tablet by mouth every 4 (four) hours as needed for pain. Patient not taking: Reported on 06/07/2015 02/11/15   Mitchel Honour, DO  traZODone (DESYREL) 100 MG tablet Take 100 mg by mouth at bedtime.    Historical Provider, MD   BP 115/57 mmHg  Pulse 91  Temp(Src) 97.6 F (36.4 C) (Oral)  Resp 20  SpO2 98%  LMP 05/28/2015 Physical Exam  Constitutional: She is oriented to person, place, and time. She appears well-developed and well-nourished.  HENT:  Head: Normocephalic.  Mouth/Throat: Oropharynx is clear and moist. No oropharyngeal exudate.  Eyes: Conjunctivae and EOM are normal. Pupils are equal, round, and reactive to light.  Neck: Normal range of motion. Neck supple.   Cardiovascular: Normal rate, regular rhythm, normal heart sounds and intact distal pulses.  Exam reveals no gallop and no friction rub.   No murmur heard. Pulmonary/Chest: Effort normal and breath sounds normal. No respiratory distress. She has no wheezes.  Abdominal: Soft. Bowel sounds are normal. There is no tenderness. There is no rebound and no guarding.  Musculoskeletal: Normal range of motion. She exhibits no edema or tenderness.  Two superficial, linear, vertical 9 cm lacerations noted on the volar aspect of the mid left forearm.  2+ distal pulses in the left arm.  Sensation intact in the left upper extremity.  Normal motor skills of the left hand and wrist.  Neurological: She is alert and oriented to person, place, and time.  Skin: Skin is warm and dry.  Psychiatric: She has a normal mood and affect. Her behavior is normal.  Mildly tearful.   Nursing note and vitals reviewed.   ED Course  LACERATION REPAIR Date/Time: 06/11/2015 8:16 AM Performed by: Purvis Sheffield Authorized by: Purvis Sheffield Consent: Verbal consent obtained. Written consent not obtained. Risks and benefits: risks, benefits and alternatives were discussed Consent given by: patient Patient understanding: patient states understanding of the procedure being performed Required items: required blood products, implants, devices, and special equipment available Patient identity confirmed: verbally with patient, arm band, provided demographic data and hospital-assigned identification number Time out: Immediately prior to procedure a "time out" was called to verify the correct patient, procedure, equipment, support staff and site/side marked as required. Location: left forearm. Laceration length: 9 cm Foreign bodies: no foreign bodies Tendon involvement: none Nerve involvement: none Vascular damage: no Anesthesia: local infiltration Local anesthetic: lidocaine 1% with epinephrine Anesthetic total: 3  ml Patient sedated: no Preparation: Patient was prepped and draped in the usual sterile fashion. Irrigation solution: saline Irrigation method: syringe Amount of cleaning: standard Debridement: none Degree of undermining: none Skin closure: glue (5-0 vicryl rapide) Wound subcutaneous closure material used: 5-0 vicryl rapide. Number of sutures: 6 Suture technique: 4 simple, 2 subcuticular. Approximation: close Approximation difficulty: simple Patient tolerance: Patient tolerated the procedure well with no immediate complications  LACERATION REPAIR Date/Time: 06/11/2015 8:19 AM Performed by: Purvis Sheffield Authorized by: Purvis Sheffield Consent: Verbal consent obtained. Written consent not obtained. Risks and benefits: risks, benefits and alternatives were discussed Consent given by: patient Patient understanding: patient states understanding of the procedure being performed Required items: required blood products, implants, devices, and special equipment available Patient identity confirmed: verbally with patient, arm band, provided demographic data and hospital-assigned identification number Time out: Immediately prior to procedure a "time out" was called to verify the correct patient, procedure, equipment, support staff and site/side marked as required. Location: left forearm. Laceration length: 9 cm Foreign bodies: no foreign bodies Tendon involvement: none Nerve involvement: none Vascular damage: no Preparation: Patient was prepped and draped in the usual sterile fashion. Irrigation solution: saline Irrigation method: syringe Amount of cleaning: standard Debridement: none Degree of undermining: none Skin closure: glue Patient tolerance: Patient tolerated the procedure well with no immediate complications   (including critical care time) Labs Review Labs Reviewed - No data to display  Imaging Review No results found.   EKG Interpretation None      MDM    Final diagnoses:  Crack cocaine use  Laceration of left forearm, initial encounter    7:09 AM 29 y.o. female with a history of depression, anxiety, PTSD, schizophrenia who presents with 2 lacerations to her left volar forearm. She states that she was hanging out with friends last night and smoked crack around 2 to 2:30 AM. She states that she got in an argument with her friends and when she came back in the room they were gone. She states that she became upset and cut herself twice with a scalpel. She states that she has a history of cutting in the remote past. She notes it was impulsive as she was upset at the time. She denies any SI or HI. She does not want to see our psych team. She is alert and oriented and appropriate on exam. She states that she has a psychiatrist, Dr. Evelene Croon. Will update tdap.   8:16 AM: Pt contracted for safety with me. Will return for any SI or thoughts of hurting herself. Lacs repaired, pt remains appropriate on exam. I have discussed the diagnosis/risks/treatment options with the patient and believe the pt to be eligible for discharge home to follow-up with her psychiatrist.  We also discussed returning to the ED immediately if new or worsening sx occur. We discussed the sx which are most concerning (e.g., suicidal thoughts, worsening depression, thoughts of hurting herself) that necessitate immediate return. Medications administered to the patient during their visit and any new prescriptions provided to the patient are listed below.  Medications given during this visit Medications  Tdap (BOOSTRIX) injection 0.5 mL (not administered)  lidocaine-EPINEPHrine (XYLOCAINE W/EPI) 1 %-1:100000 (with pres) injection (not administered)    New Prescriptions   No medications on file       Purvis Sheffield, MD 06/11/15 0820

## 2015-07-26 ENCOUNTER — Telehealth (HOSPITAL_COMMUNITY): Payer: Self-pay | Admitting: *Deleted

## 2015-07-26 NOTE — Telephone Encounter (Signed)
Opened chart for prior authorization of ECT, it appears that patient never came for July treatments. No authorization needed at this time.

## 2015-10-09 ENCOUNTER — Emergency Department (HOSPITAL_COMMUNITY)
Admission: EM | Admit: 2015-10-09 | Discharge: 2015-10-09 | Disposition: A | Discharge status: Home / Self Care | Payer: Medicaid Other | Attending: Emergency Medicine | Admitting: Emergency Medicine

## 2015-10-09 ENCOUNTER — Emergency Department (HOSPITAL_COMMUNITY): Payer: Medicaid Other

## 2015-10-09 ENCOUNTER — Encounter (HOSPITAL_COMMUNITY): Payer: Self-pay | Admitting: Emergency Medicine

## 2015-10-09 DIAGNOSIS — J40 Bronchitis, not specified as acute or chronic: Secondary | ICD-10-CM | POA: Diagnosis not present

## 2015-10-09 DIAGNOSIS — F209 Schizophrenia, unspecified: Secondary | ICD-10-CM | POA: Diagnosis not present

## 2015-10-09 DIAGNOSIS — Z8742 Personal history of other diseases of the female genital tract: Secondary | ICD-10-CM | POA: Insufficient documentation

## 2015-10-09 DIAGNOSIS — Z720 Tobacco use: Secondary | ICD-10-CM | POA: Diagnosis not present

## 2015-10-09 DIAGNOSIS — F329 Major depressive disorder, single episode, unspecified: Secondary | ICD-10-CM | POA: Insufficient documentation

## 2015-10-09 DIAGNOSIS — R079 Chest pain, unspecified: Secondary | ICD-10-CM | POA: Diagnosis present

## 2015-10-09 DIAGNOSIS — F419 Anxiety disorder, unspecified: Secondary | ICD-10-CM | POA: Insufficient documentation

## 2015-10-09 DIAGNOSIS — Z79899 Other long term (current) drug therapy: Secondary | ICD-10-CM | POA: Insufficient documentation

## 2015-10-09 DIAGNOSIS — R0609 Other forms of dyspnea: Secondary | ICD-10-CM

## 2015-10-09 DIAGNOSIS — K219 Gastro-esophageal reflux disease without esophagitis: Secondary | ICD-10-CM | POA: Diagnosis not present

## 2015-10-09 LAB — CBC
HEMATOCRIT: 37.9 % (ref 36.0–46.0)
Hemoglobin: 12.6 g/dL (ref 12.0–15.0)
MCH: 32.3 pg (ref 26.0–34.0)
MCHC: 33.2 g/dL (ref 30.0–36.0)
MCV: 97.2 fL (ref 78.0–100.0)
Platelets: 255 10*3/uL (ref 150–400)
RBC: 3.9 MIL/uL (ref 3.87–5.11)
RDW: 13.4 % (ref 11.5–15.5)
WBC: 12 10*3/uL — ABNORMAL HIGH (ref 4.0–10.5)

## 2015-10-09 LAB — BASIC METABOLIC PANEL
Anion gap: 10 (ref 5–15)
BUN: <5 mg/dL — ABNORMAL LOW (ref 6–20)
CHLORIDE: 103 mmol/L (ref 101–111)
CO2: 25 mmol/L (ref 22–32)
Calcium: 9.2 mg/dL (ref 8.9–10.3)
Creatinine, Ser: 0.76 mg/dL (ref 0.44–1.00)
GFR calc Af Amer: >60 mL/min (ref >60)
Glucose, Bld: 64 mg/dL — ABNORMAL LOW (ref 65–99)
POTASSIUM: 4 mmol/L (ref 3.5–5.1)
Sodium: 138 mmol/L (ref 135–145)

## 2015-10-09 LAB — I-STAT TROPONIN, ED: Troponin i, poc: 0 ng/mL (ref 0.00–0.08)

## 2015-10-09 MED ORDER — ALBUTEROL SULFATE HFA 108 (90 BASE) MCG/ACT IN AERS
2.0000 | INHALATION_SPRAY | Freq: Once | RESPIRATORY_TRACT | Status: AC
  Administered 2015-10-09: 2 via RESPIRATORY_TRACT
  Filled 2015-10-09: qty 6.7

## 2015-10-09 MED ORDER — DEXAMETHASONE 4 MG PO TABS
10.0000 mg | ORAL_TABLET | Freq: Once | ORAL | Status: AC
  Administered 2015-10-09: 10 mg via ORAL
  Filled 2015-10-09: qty 3

## 2015-10-09 MED ORDER — IPRATROPIUM-ALBUTEROL 0.5-2.5 (3) MG/3ML IN SOLN
3.0000 mL | Freq: Once | RESPIRATORY_TRACT | Status: AC
  Administered 2015-10-09: 3 mL via RESPIRATORY_TRACT
  Filled 2015-10-09: qty 3

## 2015-10-09 NOTE — ED Notes (Signed)
Pt c/o center chest pain that radiates to right shoulder x 4 days. Pt reports feeling hot and cold at times.

## 2015-10-09 NOTE — ED Provider Notes (Signed)
CSN: [id number]     Arrival date & time 10/09/15  1408 History   None    Chief Complaint  Patient presents with  . Chest Pain  . Shortness of Breath    The history is provided by the patient, a significant other and medical records.     29 year old female with history of schizophrenia, substance abuse, depression/anxiety, GERD presenting with chest pain and shortness of breath. Onset 4 days ago. Worsening since then. Described as "tightness in the chest" and "elephant sitting on me". Radiating to left shoulder. Worse with exertion- patient states feels severely short of breath even with 10 steps across house. Associated with dry cough and chills. Denies leg swelling or history of PE, no hormone use or recent immobilization/surgery. Similar prior episodes in past that pt states were dx as asthmatic bronchitis. Has never been tested formally for asthma or COPD. Has been heavy smoker since age 88, now cut back to 1 ppd.    Past Medical History  Diagnosis Date  . Depression   . Anxiety   . History of ovarian cyst   . Bartholin cyst LEFT SIDE  . Bicornuate uterus S/P EXCISION RIGHT UTERUS HORN 2007  . GERD (gastroesophageal reflux disease)     diet controlled - no meds  . Headache     sleep, xanax  . PTSD (post-traumatic stress disorder)   . Schizophrenia [facility])    Past Surgical History  Procedure Laterality Date  . Laparoscopic appendectomy and right ovarian cystectomy  09-04-2004  . Laparoscopic excision hypoplastic right uterine horn and right salpingoophectomy  03-18-2006  . Bartholin cyst marsupialization  07/18/2012    Procedure: BARTHOLIN CYST MARSUPIALIZATION;  Surgeon: Ok Edwards, MD;  Location: [facility];  Service: Gynecology;  Laterality: Left;  Left Bartholin Duct Marsupilization. One hour.  Thanks!  . Diagnostic laparoscopy    . Appendectomy    . Wisdom tooth extraction    . Bartholin gland cyst excision Left 02/11/2015    Procedure: BARTHOLIN GLAND  EXCISION;  Surgeon: Mitchel Honour, DO;  Location: [facility];  Service: Gynecology;  Laterality: Left;   Family History  Problem Relation Age of Onset  . Diabetes Mother   . Hyperlipidemia Mother   . Bipolar disorder Mother   . Depression Mother   . Hypertension Maternal Grandmother   . Heart disease Maternal Grandmother   . Diabetes Maternal Grandfather   . Hypertension Maternal Grandfather   . Hyperlipidemia Maternal Grandfather   . Alcohol abuse Maternal Grandfather   . Breast cancer Paternal Grandmother   . Alcohol abuse Father   . Drug abuse Maternal Aunt   . Schizophrenia Cousin    Social History  Substance Use Topics  . Smoking status: Current Every Day Smoker -- 0.50 packs/day for 12 years    Types: Cigarettes  . Smokeless tobacco: Never Used  . Alcohol Use: No   OB History    Gravida Para Term Preterm AB TAB SAB Ectopic Multiple Living   0              Review of Systems  Constitutional: Positive for chills. Negative for fever.  HENT: Negative for rhinorrhea.   Eyes: Negative for visual disturbance.  Respiratory: Positive for cough, shortness of breath and wheezing.   Cardiovascular: Positive for chest pain. Negative for leg swelling.  Gastrointestinal: Negative for vomiting and abdominal pain.  Genitourinary: Negative for decreased urine volume.  Skin: Negative for rash.  Allergic/Immunologic: Negative for immunocompromised state.  Neurological:  Negative for syncope.  Psychiatric/Behavioral: Negative for confusion.      Allergies  Hydrocodone-acetaminophen  Home Medications   Prior to Admission medications   Medication Sig Start Date End Date Taking? Authorizing Provider  ALPRAZolam Prudy Feeler) 1 MG tablet Take 1 mg by mouth 2 (two) times daily as needed for anxiety.   Yes Historical Provider, MD  citalopram (CELEXA) 20 MG tablet Take 30 mg by mouth daily.   Yes Historical Provider, MD  Eszopiclone 3 MG TABS Take 3 mg by mouth at bedtime. Take immediately  before bedtime   Yes Historical Provider, MD  ibuprofen (ADVIL,MOTRIN) 800 MG tablet Take 1 tablet (800 mg total) by mouth every 8 (eight) hours as needed. 02/11/15  Yes Megan Morris, DO  Loratadine (CLARITIN) 10 MG CAPS Take 10 mg by mouth daily.   Yes Historical Provider, MD  OLANZapine (ZYPREXA) 10 MG tablet Take 10 mg by mouth at bedtime.   Yes Historical Provider, MD  traZODone (DESYREL) 100 MG tablet Take 150 mg by mouth at bedtime.    Yes Historical Provider, MD   BP 115/64 mmHg  Pulse 102  Temp(Src) 98.2 F (36.8 C) (Oral)  Resp 20  Ht 5\' 4"  (1.626 m)  Wt 150 lb (68.04 kg)  BMI 25.73 kg/m2  SpO2 97%  LMP 09/18/2015 Physical Exam  Constitutional: She is oriented to person, place, and time. She appears well-developed and well-nourished. No distress.  HENT:  Head: Normocephalic and atraumatic.  Eyes: Right eye exhibits no discharge. Left eye exhibits no discharge.  Neck: No tracheal deviation present.  Cardiovascular: Normal rate and regular rhythm.   Pulmonary/Chest: Effort normal. No respiratory distress. She has wheezes.  Abdominal: Soft. She exhibits no distension. There is no tenderness.  Musculoskeletal: She exhibits no edema.  Neurological: She is alert and oriented to person, place, and time.  Skin: Skin is warm and dry.  Psychiatric: She has a normal mood and affect. Her behavior is normal.  Nursing note and vitals reviewed.   ED Course  Procedures (including critical care time)     EMERGENCY DEPARTMENT [place] CARDIAC EXAM "Study: Limited Ultrasound of the heart and pericardium"  INDICATIONS:Dyspnea Multiple views of the heart and pericardium are obtained with a multi-frequency probe.  PERFORMED TA:[id number]  IMAGES ARCHIVED?: Yes  FINDINGS: No pericardial effusion, Normal contractility and Tamponade physiology absent  LIMITATIONS:  Body habitus  VIEWS USED: Subcostal 4 chamber, Parasternal long axis, Parasternal short axis and Apical 4 chamber    INTERPRETATION: Cardiac activity present, Pericardial effusioin absent, Cardiac tamponade absent and Normal contractility  COMMENT:      Labs Review Labs Reviewed  BASIC METABOLIC PANEL - Abnormal; Notable for the following:    Glucose, Bld 64 (*)    BUN <5 (*)    All other components within normal limits  CBC - Abnormal; Notable for the following:    WBC 12.0 (*)    All other components within normal limits  I-STAT TROPOININ, ED    Imaging Review Dg Chest 2 View  10/09/2015   CLINICAL DATA:  Four day history of left-sided chest pain localizing beneath the left breast radiating to the left shoulder. Current smoker with asthmatic bronchitis.  EXAM: CHEST  2 VIEW  COMPARISON:  No prior chest x-ray. Thoracic spine x-rays 09/19/2012 are correlated.  FINDINGS: Cardiomediastinal silhouette unremarkable. Vertically oriented linear scarring in the right lower lobe. Mild central peribronchial thickening. Lungs otherwise clear. No localized airspace consolidation. No pleural effusions. No pneumothorax. Normal pulmonary vascularity. Visualized  bony thorax intact.  IMPRESSION: Mild changes of bronchitis and/or asthma which may be acute or chronic. Linear scarring in the right lower lobe. No acute cardiopulmonary disease otherwise.   Electronically Signed   By: Hulan Saas M.D.   On: 10/09/2015 15:55   I have personally reviewed and evaluated these images and lab results as part of my medical decision-making.   EKG Interpretation   Date/Time:  Sunday October 09 2015 14:16:01 EDT Ventricular Rate:  100 PR Interval:  160 QRS Duration: 86 QT Interval:  338 QTC Calculation: 436 R Axis:   75 Text Interpretation:  Normal sinus rhythm Nonspecific T wave abnormality  Abnormal ECG Confirmed by ZAVITZ  MD, JOSHUA ([id number]) on 10/09/2015 3:41:30  PM      MDM   Final diagnoses:  Bronchitis  Dyspnea on exertion    29 year old female with history of schizophrenia, substance abuse,  depression/anxiety, GERD presenting with chest pain and shortness of breath as above. Severe SOB, described as severe dyspnea with even 10 steps across the house. Similar presentations in past that were dx as asthmatic bronchitis but pt states this is even more severe. Low risk for ACS, with neg trop after 4 days of sx, atypical story, no acute concerning findings on EKG feel ACS very unlikely. Low risk for PE, Wells/PERC neg. No PNA or PTX on CXR. Given wheezing and significant smoking history feel lung disease probable, improved with respiratory therapy. However, given severity of sx consider pulmonary HTN as well if no underlying lung disease. No evidence of right heart strain or dilation on bedside ECHO. Dc in stable condition with return precautions and strict f/u instructions to see PCP and / or Cardiology to consider PFTs and formal echo.   Case discussed with Dr. Siri Cole who oversaw management of this patient.   Urban Gibson, MD 10/10/15 1525  Blane Ohara, MD 10/12/15 [phone]

## 2015-10-09 NOTE — Discharge Instructions (Signed)
How to Use an Inhaler  Using your inhaler correctly is very important. Good technique will make sure that the medicine reaches your lungs.   HOW TO USE AN INHALER:  1. Take the cap off the inhaler.  2. If this is the first time using your inhaler, you need to prime it. Shake the inhaler for 5 seconds. Release four puffs into the air, away from your face. Ask your doctor for help if you have questions.  3. Shake the inhaler for 5 seconds.  4. Turn the inhaler so the bottle is above the mouthpiece.  5. Put your pointer finger on top of the bottle. Your thumb holds the bottom of the inhaler.  6. Open your mouth.  7. Either hold the inhaler away from your mouth (the width of 2 fingers) or place your lips tightly around the mouthpiece. Ask your doctor which way to use your inhaler.  8. Breathe out as much air as possible.  9. Breathe in and push down on the bottle 1 time to release the medicine. You will feel the medicine go in your mouth and throat.  10. Continue to take a deep breath in very slowly. Try to fill your lungs.  11. After you have breathed in completely, hold your breath for 10 seconds. This will help the medicine to settle in your lungs. If you cannot hold your breath for 10 seconds, hold it for as long as you can before you breathe out.  12. Breathe out slowly, through pursed lips. Whistling is an example of pursed lips.  13. If your doctor has told you to take more than 1 puff, wait at least 15-30 seconds between puffs. This will help you get the best results from your medicine. Do not use the inhaler more than your doctor tells you to.  14. Put the cap back on the inhaler.  15. Follow the directions from your doctor or from the inhaler package about cleaning the inhaler.  If you use more than one inhaler, ask your doctor which inhalers to use and what order to use them in. Ask your doctor to help you figure out when you will need to refill your inhaler.   If you use a steroid inhaler, always rinse your  mouth with water after your last puff, gargle and spit out the water. Do not swallow the water.  GET HELP IF:  · The inhaler medicine only partially helps to stop wheezing or shortness of breath.  · You are having trouble using your inhaler.  · You have some increase in thick spit (phlegm).  GET HELP RIGHT AWAY IF:  · The inhaler medicine does not help your wheezing or shortness of breath or you have tightness in your chest.  · You have dizziness, headaches, or fast heart rate.  · You have chills, fever, or night sweats.  · You have a large increase of thick spit, or your thick spit is bloody.  MAKE SURE YOU:   · Understand these instructions.  · Will watch your condition.  · Will get help right away if you are not doing well or get worse.     This information is not intended to replace advice given to you by your health care provider. Make sure you discuss any questions you have with your health care provider.     Document Released: 09/25/2008 Document Revised: 10/07/2013 Document Reviewed: 07/16/2013  Elsevier Interactive Patient Education ©2016 [employer]

## 2015-10-13 ENCOUNTER — Ambulatory Visit (INDEPENDENT_AMBULATORY_CARE_PROVIDER_SITE_OTHER): Payer: Medicaid Other | Admitting: Cardiology

## 2015-10-13 ENCOUNTER — Encounter: Payer: Self-pay | Admitting: Cardiology

## 2015-10-13 VITALS — BP 98/52 | HR 83 | Ht 64.0 in | Wt 158.4 lb

## 2015-10-13 DIAGNOSIS — J40 Bronchitis, not specified as acute or chronic: Secondary | ICD-10-CM | POA: Diagnosis not present

## 2015-10-13 DIAGNOSIS — R079 Chest pain, unspecified: Secondary | ICD-10-CM

## 2015-10-13 DIAGNOSIS — Z720 Tobacco use: Secondary | ICD-10-CM

## 2015-10-13 DIAGNOSIS — R000 Tachycardia, unspecified: Secondary | ICD-10-CM | POA: Diagnosis not present

## 2015-10-13 NOTE — Progress Notes (Addendum)
NEW PATIENT Cardiology Office Note   Date:  10/13/2015   ID:  [name], DOB 10-25-1986, MRN [id number]  PCP:  [name]  Cardiologist:  New- Dr. Eldridge Barrett    Chief Complaint  Patient presents with  . Chest Pain      History of Present Illness: [name] is a 29 y.o. female who presents for recent episode of tachycardia.  She was seen in the ER.  She has a hx of schizophrenia, substance abuse, depression/anxiety, GERD presented with chest pain and shortness of breath.   In ER her chest pain was described as elephant sitting on her chest.  Discomfort radiated to Lt shoulder.  Increased with exertion.  Also with DOE.  Does have hx of asthmatic bronchitis.  Hx tobacco use, heavy now back to 1ppd.  ER MD felt this was respiratory chest pain but did want cardiology consult.  Neg troponin in ER, WBC elevated and glucose low at 64.  Today continues with chest pain continuous with episodes of squeezing.  She describes her SOB as "hard to breathe"  Continues but with exertion is increased.  She occ. becomes mildly diaphoretic with chest pain.   No nausea or vomiting.  occ episode of lightheadedness.  Her wheezes continue and are increased in AM and PM.  She has decreased her tobacco to 10 cigarettes a day.     Past Medical History  Diagnosis Date  . Depression   . Anxiety   . History of ovarian cyst   . Bartholin cyst LEFT SIDE  . Bicornuate uterus S/P EXCISION RIGHT UTERUS HORN 2007  . GERD (gastroesophageal reflux disease)     diet controlled - no meds  . Headache     sleep, xanax  . PTSD (post-traumatic stress disorder)   . Schizophrenia [facility])     Past Surgical History  Procedure Laterality Date  . Laparoscopic appendectomy and right ovarian cystectomy  09-04-2004  . Laparoscopic excision hypoplastic right uterine horn and right salpingoophectomy  03-18-2006  . Bartholin cyst marsupialization  07/18/2012    Procedure: BARTHOLIN CYST  MARSUPIALIZATION;  Surgeon: Ok Edwards, MD;  Location: [facility];  Service: Gynecology;  Laterality: Left;  Left Bartholin Duct Marsupilization. One hour.  Thanks!  . Diagnostic laparoscopy    . Appendectomy    . Wisdom tooth extraction    . Bartholin gland cyst excision Left 02/11/2015    Procedure: BARTHOLIN GLAND EXCISION;  Surgeon: Mitchel Honour, DO;  Location: [facility];  Service: Gynecology;  Laterality: Left;     Current Outpatient Prescriptions  Medication Sig Dispense Refill  . ALPRAZolam (XANAX) 1 MG tablet Take 1 mg by mouth 2 (two) times daily as needed for anxiety.    . citalopram (CELEXA) 20 MG tablet Take 30 mg by mouth daily.    . Eszopiclone 3 MG TABS Take 3 mg by mouth at bedtime. Take immediately before bedtime    . ibuprofen (ADVIL,MOTRIN) 800 MG tablet Take 800 mg by mouth every 8 (eight) hours as needed for headache or moderate pain.    . Loratadine (CLARITIN) 10 MG CAPS Take 10 mg by mouth daily.    Marland Kitchen OLANZapine (ZYPREXA) 10 MG tablet Take 10 mg by mouth at bedtime.    . traZODone (DESYREL) 100 MG tablet Take 150 mg by mouth at bedtime.      No current facility-administered medications for this visit.    Allergies:   Hydrocodone-acetaminophen  Social History:  The patient  reports that she has been smoking Cigarettes.  She has a 6 pack-year smoking history. She has never used smokeless tobacco. She reports that she does not drink alcohol or use illicit drugs.   Family History:  The patient's family history includes Alcohol abuse in her father and maternal grandfather; Bipolar disorder in her mother; Breast cancer in her paternal grandmother; Cancer in her paternal grandmother; Cancer - Colon in her paternal grandfather; Depression in her mother; Diabetes in her maternal grandfather and mother; Drug abuse in her maternal aunt; Heart disease in her father; Heart disease (age of onset: 76) in her maternal grandmother; Hyperlipidemia in her maternal  grandfather and mother; Hypertension in her maternal grandfather and maternal grandmother; Schizophrenia in her cousin.    ROS:  General:+ bronchitis or fevers, no weight changes Skin:no rashes or ulcers HEENT:no blurred vision, no congestion CV:see HPI PUL:see HPI GI:no diarrhea constipation or melena, no indigestion GU:no hematuria, no dysuria MS:no joint pain, no claudication Neuro:no syncope, + lightheadedness Endo:no diabetes, no thyroid disease GYN:  LMP 09-18-15  Wt Readings from Last 3 Encounters:  10/13/15 158 lb 6.4 oz (71.85 kg)  10/09/15 150 lb (68.04 kg)  01/27/15 140 lb (63.504 kg)     PHYSICAL EXAM: VS:  BP 98/52 mmHg  Pulse 83  Ht  (1.626 m)  Wt 158 lb 6.4 oz (71.85 kg)  BMI 27.18 kg/m2  LMP 09/18/2015 , BMI Body mass index is 27.18 kg/(m^2). General:Pleasant affect, NAD Skin:Warm and dry, brisk capillary refill HEENT:normocephalic, sclera clear, mucus membranes moist, + lip piercing Neck:supple, no JVD, no bruits  Heart:S1S2 RRR without murmur, gallup, rub or click Lungs:harsh breath sounds without rales, rhonchi, or wheezes ZOX:WRUE, non tender, + BS, do not palpate liver spleen or masses Ext:no lower ext edema, 2+ pedal pulses, 2+ radial pulses Neuro:alert and oriented X 3, MAE, follows commands, + facial symmetry    EKG:  EKG is ordered today. The ekg ordered today demonstrates SR at 83 with nonspecific T wave changes.  No acute changes from the 9th and similar to EKG in 2013.     Recent Labs: 01/10/2015: ALT 10 10/09/2015: BUN <5*; Creatinine, Ser 0.76; Hemoglobin 12.6; Platelets 255; Potassium 4.0; Sodium 138    Lipid Panel No results found for: CHOL, TRIG, HDL, CHOLHDL, VLDL, LDLCALC, LDLDIRECT     Other studies Reviewed: Additional studies/ records that were reviewed today include: hospital note .labs CHEST 2 VIEW COMPARISON: No prior chest x-ray. Thoracic spine x-rays 09/19/2012 are correlated. FINDINGS: Cardiomediastinal  silhouette unremarkable. Vertically oriented linear scarring in the right lower lobe. Mild central peribronchial thickening. Lungs otherwise clear. No localized airspace consolidation. No pleural effusions. No pneumothorax. Normal pulmonary vascularity. Visualized bony thorax intact. IMPRESSION: Mild changes of bronchitis and/or asthma which may be acute or chronic. Linear scarring in the right lower lobe. No acute cardiopulmonary disease otherwise  ASSESSMENT AND PLAN:  1.  Chest pain most likely related to bronchitis but to be thorough will do stress echo after discussion with Dr. Eldridge Barrett.  If any issues would proceed with further testing. Will schedule a follow up- she was instructed if symptoms resolve with the bronchitis she can cancel the study.  Best to wait at least 2 weeks for the study to let her lungs clear.     2. Bronchitis- treated per ER  3. Tachycardia due to acute bronchitis.  4. Tobacco abuse- we did discuss stopping and she is trying to a least decrease  her use. She has decreased to 10 cigs per. Day.    Current medicines are reviewed with the patient today.  The patient Has no concerns regarding medicines.  The following changes have been made:  See above Labs/ tests ordered today include:see above  Disposition:   FU:  see above  Signed, Leone Brand, NP  10/13/2015 8:57 AM    [facility] [address] [place], [place], [place]  [address]/ [address] [place], [place] Phone: [phone]; Fax: [phone]  [phone]  I have examined the patient and reviewed assessment and plan and discussed with patient.  Agree with above as stated.   Given her age, is unlikely that she has obstructive coronary artery disease. She does have persistent symptoms which are worse with exertion. I recommended strongly that she stop smoking. If her symptoms persist over the next few weeks, could schedule exercise stress echo to further  evaluate.  VARANASI,JAYADEEP S.

## 2015-10-13 NOTE — Patient Instructions (Addendum)
Medication Instructions:  Your physician recommends that you continue on your current medications as directed. Please refer to the Current Medication list given to you today.   Labwork: None ordered  Testing/Procedures: Your physician has requested that you have a stress echocardiogram. For further information please visit [email or web address] Please follow instruction sheet as given.    Follow-Up: Your physician recommends that you schedule a follow-up appointment in: 2 WEEKS WITH 1ST AVAILABLE EXTENDER   Any Other Special Instructions Will Be Listed Below (If Applicable).  Exercise Stress Echocardiogram An exercise stress echocardiogram is a heart (cardiac) test used to check the function of your heart. This test may also be called an exercise stress echocardiography or stress echo. This stress test will check how well your heart muscle and valves are working and determine if your heart muscle is getting enough blood. You will exercise on a treadmill to naturally increase or stress the functioning of your heart.  An echocardiogram uses sound waves (ultrasound) to produce an image of your heart. If your heart does not work normally, it may indicate coronary artery disease with poor coronary blood supply. The coronary arteries are the arteries that bring blood and oxygen to your heart. LET [facility] CARE PROVIDER KNOW ABOUT:  Any allergies you have.  All medicines you are taking, including vitamins, herbs, eye drops, creams, and over-the-counter medicines.  Previous problems you or members of your family have had with the use of anesthetics.  Any blood disorders you have.  Previous surgeries you have had.  Medical conditions you have.  Possibility of pregnancy, if this applies. RISKS AND COMPLICATIONS Generally, this is a safe procedure. However, as with any procedure, complications can occur. Possible complications can include:  You develop pain or pressure in the following  areas:  Chest.  Jaw or neck.  Between your shoulder blades.  Radiating down your left arm.  Dizziness or lightheadedness.  Shortness of breath.  Increased or irregular heartbeat.  Nausea or vomiting.  Heart attack (rare). BEFORE THE PROCEDURE  Avoid all forms of caffeine for 24 hours before your test or as directed by your health care provider. This includes coffee, tea (even decaffeinated tea), caffeinated sodas, chocolate, cocoa, and certain pain medicines.  Follow your health care provider's instructions regarding eating and drinking before the test.  Take your medicines as directed at regular times with water unless instructed otherwise. Exceptions may include:  If you have diabetes, ask how you are to take your insulin or pills. It is common to adjust insulin dosing the morning of the test.  If you are taking beta-blocker medicines, it is important to talk to your health care provider about these medicines well before the date of your test. Taking beta-blocker medicines may interfere with the test. In some cases, these medicines need to be changed or stopped 24 hours or more before the test.  If you wear a nitroglycerin patch, it may need to be removed prior to the test. Ask your health care provider if the patch should be removed before the test.  If you use an inhaler for any breathing condition, bring it with you to the test.  If you are an outpatient, bring a snack so you can eat right after the stress phase of the test.  Do not smoke for 4 hours prior to the test or as directed by your health care provider.  Wear loose-fitting clothes and comfortable shoes for the test. This test involves walking on a treadmill. PROCEDURE  Multiple electrodes will be put on your chest. If needed, small areas of your chest may be shaved to get better contact with the electrodes. Once the electrodes are attached to your body, multiple wires will be attached to the electrodes, and  your heart rate will be monitored.  You will have an echocardiogram done at rest.  To produce this image of your heart, gel is applied to your chest, and a wand-like tool (transducer) is moved over the chest. The transducer sends the sound waves through the chest to create the moving images of your heart.  You may need an IV to receive a medication that improves the quality of the pictures.  You will then walk on a treadmill. The treadmill will be started at a slow pace. The treadmill speed and incline will gradually be increased to raise your heart rate.  At the peak of exercise, the treadmill will be stopped. You will lie down immediately on a bed so that a second echocardiogram can be done to visualize your heart's motion with exercise.  The test usually takes 30-60 minutes to complete. AFTER THE PROCEDURE  Your heart rate and blood pressure will be monitored after the test.  You may return to your normal schedule, including diet, activities, and medicines, unless your health care provider tells you otherwise.   This information is not intended to replace advice given to you by your health care provider. Make sure you discuss any questions you have with your health care provider.   Document Released: 12/21/2004 Document Revised: 12/22/2013 Document Reviewed: 08/24/2013 Elsevier Interactive Patient Education [employer]

## 2015-10-24 ENCOUNTER — Ambulatory Visit (HOSPITAL_COMMUNITY): Payer: Medicaid Other | Attending: Internal Medicine

## 2015-10-24 ENCOUNTER — Ambulatory Visit (HOSPITAL_BASED_OUTPATIENT_CLINIC_OR_DEPARTMENT_OTHER): Payer: Medicaid Other

## 2015-10-24 DIAGNOSIS — R079 Chest pain, unspecified: Secondary | ICD-10-CM | POA: Diagnosis not present

## 2015-10-24 DIAGNOSIS — R0989 Other specified symptoms and signs involving the circulatory and respiratory systems: Secondary | ICD-10-CM

## 2015-10-26 NOTE — Progress Notes (Signed)
Cardiology Office Note   Date:  10/27/2015   ID:  [name], DOB 08-15-1986, MRN [id number]  PCP:  [facility]  Cardiologist:  Dr. Everette Rank   Electrophysiologist:  n/a  Chief Complaint  Patient presents with  . Follow-up  . Chest Pain     History of Present Illness: [name] is a 29 y.o. female with a hx of schizophrenia, substance abuse, depression/anxiety, GERD, tobacco abuse.  She was evaluated by Dr. Everette Rank and Nada Boozer, NP 10/13 after a visit to the ED with chest pain.  She was apparently tachycardic as well. She was treated for bronchitis.  Symptoms were felt to be non-cardiac.  ETT-Echo was arranged.  This was normal.    Returns for FU.  She continues to have symptoms of chest discomfort and tightness as well as shortness of breath. She was given albuterol to use as needed when she went to the emergency room. This clearly improves all of her symptoms. She denies orthopnea. She does awaken in the middle of the night coughing and wheezing. She denies significant edema. She denies syncope. She does have a 30 pound weight gain over the last several months.   Studies/Reports Reviewed Today:  ETT-Echo 10/24/15 Study Conclusions - Stress ECG conclusions: There were no stress arrhythmias or conduction abnormalities. The stress ECG was normal. Dukescoring: exercise time of 6.5 min; maximum ST deviation of 0 mm; angina present but did not limit exercise; resulting score is 3. This score predicts a moderate risk of cardiac events. - Staged echo: Normal echo stress Impressions: - Normal study after maximal exercise.   Past Medical History  Diagnosis Date  . Depression   . Anxiety   . History of ovarian cyst   . Bartholin cyst LEFT SIDE  . Bicornuate uterus S/P EXCISION RIGHT UTERUS HORN 2007  . GERD (gastroesophageal reflux disease)     diet controlled - no meds  . Headache     sleep, xanax  . PTSD (post-traumatic stress  disorder)   . Schizophrenia [facility])     Past Surgical History  Procedure Laterality Date  . Laparoscopic appendectomy and right ovarian cystectomy  09-04-2004  . Laparoscopic excision hypoplastic right uterine horn and right salpingoophectomy  03-18-2006  . Bartholin cyst marsupialization  07/18/2012    Procedure: BARTHOLIN CYST MARSUPIALIZATION;  Surgeon: Ok Edwards, MD;  Location: [facility];  Service: Gynecology;  Laterality: Left;  Left Bartholin Duct Marsupilization. One hour.  Thanks!  . Diagnostic laparoscopy    . Appendectomy    . Wisdom tooth extraction    . Bartholin gland cyst excision Left 02/11/2015    Procedure: BARTHOLIN GLAND EXCISION;  Surgeon: Mitchel Honour, DO;  Location: [facility];  Service: Gynecology;  Laterality: Left;     Current Outpatient Prescriptions  Medication Sig Dispense Refill  . ALPRAZolam (XANAX) 1 MG tablet Take 1 mg by mouth 2 (two) times daily as needed for anxiety.    . citalopram (CELEXA) 20 MG tablet Take 30 mg by mouth daily.    . Eszopiclone 3 MG TABS Take 3 mg by mouth at bedtime. Take immediately before bedtime    . ibuprofen (ADVIL,MOTRIN) 800 MG tablet Take 800 mg by mouth every 8 (eight) hours as needed for headache or moderate pain.    . Loratadine (CLARITIN) 10 MG CAPS Take 10 mg by mouth daily.    Marland Kitchen OLANZapine (ZYPREXA) 5 MG tablet Take 5 mg by mouth  at bedtime.    . traZODone (DESYREL) 100 MG tablet Take 150 mg by mouth at bedtime.     . fluticasone (FLOVENT HFA) 110 MCG/ACT inhaler Inhale 1 puff into the lungs 2 (two) times daily. 1 Inhaler 2   No current facility-administered medications for this visit.    Allergies:   Hydrocodone-acetaminophen    Social History:   Social History   Social History  . Marital Status: Single    Spouse Name: N/A  . Number of Children: N/A  . Years of Education: N/A   Social History Main Topics  . Smoking status: Current Every Day Smoker -- 0.50 packs/day for 12 years     Types: Cigarettes  . Smokeless tobacco: Never Used  . Alcohol Use: No  . Drug Use: No     Comment: crack  quit 06-10-15  . Sexual Activity: Yes    Birth Control/ Protection: None   Other Topics Concern  . None   Social History Narrative     Family History:   Family History  Problem Relation Age of Onset  . Diabetes Mother   . Hyperlipidemia Mother   . Bipolar disorder Mother   . Depression Mother   . Hypertension Maternal Grandmother   . Heart disease Maternal Grandmother 60  . Diabetes Maternal Grandfather   . Hypertension Maternal Grandfather   . Hyperlipidemia Maternal Grandfather   . Alcohol abuse Maternal Grandfather   . Breast cancer Paternal Grandmother   . Cancer Paternal Grandmother     breast  . Alcohol abuse Father   . Heart disease Father     not sure what it is  . Drug abuse Maternal Aunt   . Schizophrenia Cousin   . Cancer - Colon Paternal Grandfather       ROS:   Please see the history of present illness.   Review of Systems  Constitution: Positive for weight gain.  HENT: Positive for headaches.   Cardiovascular: Positive for chest pain, dyspnea on exertion and leg swelling.  Respiratory: Positive for cough, shortness of breath and wheezing.   All other systems reviewed and are negative.     PHYSICAL EXAM: VS:  BP 108/50 mmHg  Pulse 92  Ht  (1.626 m)  Wt 163 lb 6.4 oz (74.118 kg)  BMI 28.03 kg/m2  LMP 09/18/2015    Wt Readings from Last 3 Encounters:  10/27/15 163 lb 6.4 oz (74.118 kg)  10/13/15 158 lb 6.4 oz (71.85 kg)  10/09/15 150 lb (68.04 kg)     GEN: Well nourished, well developed, in no acute distress HEENT: normal Neck: no JVD, no masses Cardiac:  Normal S1/S2, RRR; no murmur ,  no rubs or gallops, no edema   Respiratory:  clear to auscultation bilaterally, no wheezing, rhonchi or rales. GI: soft, nontender, nondistended, + BS MS: no deformity or atrophy Skin: warm and dry  Neuro:  CNs II-XII intact, Strength and  sensation are intact Psych: Normal affect   EKG:  EKG is ordered today.  It demonstrates:   NSR, HR 86, normal axis, NSSTTW changes, no change from prior tracing   Recent Labs: 01/10/2015: ALT 10 10/09/2015: BUN <5*; Creatinine, Ser 0.76; Hemoglobin 12.6; Platelets 255; Potassium 4.0; Sodium 138    Lipid Panel No results found for: CHOL, TRIG, HDL, CHOLHDL, VLDL, LDLCALC, LDLDIRECT    ASSESSMENT AND PLAN:  1. Chest Pain:  Noncardiac.  Recent ETT-Echo was normal.  No further cardiac testing indicated.  2. Tobacco Abuse:  We  discussed the importance of tobacco cessation.  3. Asthmatic Bronchitis: The patient clearly has symptoms of reactive airways disease contributing to her symptoms. Recent echocardiogram done for her ETT-echo demonstrated normal LV function. I will obtain a BNP to completely rule out the possibility of cardiac induced shortness of breath. She needs primary care follow-up. Unfortunately, she cannot get into see primary care until December. I will give her a prescription for Flovent 110 g twice a day. This can be filled in the future with her primary care physician. She will continue when necessary albuterol. She knows to go to urgent care if she has worsening symptoms.  4. Weight Gain: I suspect that this may be related to some of her psychotropic medications. She should follow-up with her psychiatrist to discuss this further. I will check a TSH.     Medication Changes: Current medicines are reviewed at length with the patient today.  Concerns regarding medicines are as outlined above.  The following changes have been made:   Discontinued Medications   OLANZAPINE (ZYPREXA) 10 MG TABLET    Take 10 mg by mouth at bedtime.   Modified Medications   No medications on file   New Prescriptions   FLUTICASONE (FLOVENT HFA) 110 MCG/ACT INHALER    Inhale 1 puff into the lungs 2 (two) times daily.   Labs/ tests ordered today include:   Orders Placed This Encounter    Procedures  . TSH  . B Nat Peptide  . EKG 12-Lead     Disposition:    FU with Dr. Everette Rank prn     Signed, Tereso Newcomer, PA-C, MHS 10/27/2015 12:41 PM    [facility] [address] [place], [place], [place]  [address] Phone: [phone]; Fax: [phone]

## 2015-10-27 ENCOUNTER — Encounter: Payer: Self-pay | Admitting: Physician Assistant

## 2015-10-27 ENCOUNTER — Ambulatory Visit (INDEPENDENT_AMBULATORY_CARE_PROVIDER_SITE_OTHER): Payer: Medicaid Other | Admitting: Physician Assistant

## 2015-10-27 VITALS — BP 108/50 | HR 92 | Ht 64.0 in | Wt 163.4 lb

## 2015-10-27 DIAGNOSIS — R079 Chest pain, unspecified: Secondary | ICD-10-CM | POA: Diagnosis not present

## 2015-10-27 DIAGNOSIS — J4540 Moderate persistent asthma, uncomplicated: Secondary | ICD-10-CM | POA: Diagnosis not present

## 2015-10-27 DIAGNOSIS — R635 Abnormal weight gain: Secondary | ICD-10-CM | POA: Diagnosis not present

## 2015-10-27 DIAGNOSIS — Z720 Tobacco use: Secondary | ICD-10-CM

## 2015-10-27 DIAGNOSIS — R0602 Shortness of breath: Secondary | ICD-10-CM

## 2015-10-27 LAB — TSH: TSH: 1.445 u[IU]/mL (ref 0.350–4.500)

## 2015-10-27 MED ORDER — FLUTICASONE PROPIONATE HFA 110 MCG/ACT IN AERO: 1.0000 | INHALATION_SPRAY | Freq: Two times a day (BID) | RESPIRATORY_TRACT | Status: DC

## 2015-10-27 NOTE — Patient Instructions (Signed)
Medication Instructions:  1. START FLOVENT HFA 110 MCG 1 PUFF TWICE DAILY FOR SHORTNESS OF BREATH; YOU WILL NEED TO MAKE SURE TO RINSE YOUR MOUTH OUT WELL AFTER USING THE FLOVENT INHALER  2. IF BREATHING BECOMES WORSE YOU HAVE BEEN ADVISED TO GO TO URGENT CARE  Labwork: TODAY TSH, BNP  Testing/Procedures: NONE  Follow-Up: DR. VARANASI AS NEEDED  Any Other Special Instructions Will Be Listed Below (If Applicable).     If you need a refill on your cardiac medications before your next appointment, please call your pharmacy.

## 2015-10-28 ENCOUNTER — Telehealth: Payer: Self-pay | Admitting: *Deleted

## 2015-10-28 LAB — BRAIN NATRIURETIC PEPTIDE

## 2015-10-28 NOTE — Telephone Encounter (Signed)
Pt notified of lab results by phone with verbal understanding.  

## 2015-10-28 NOTE — Telephone Encounter (Signed)
Lmptcb for lab results 

## 2016-01-31 IMAGING — US US ART/VEN ABD/PELV/SCROTUM DOPPLER LTD
1 series · 13 of 25 positions shown · non-contrast
Comparison: CT scan dated 01/10/2015 and pelvic ultrasound dated
11/13/2014

CLINICAL DATA: Left adnexal mass. Left lower quadrant pain.
Previous right oophorectomy.

EXAM:
TRANSABDOMINAL AND TRANSVAGINAL ULTRASOUND OF PELVIS
DOPPLER ULTRASOUND OF OVARIES
TECHNIQUE: Both transabdominal and transvaginal ultrasound examinations of the
pelvis were performed. Transabdominal technique was performed for
global imaging of the pelvis including uterus, ovaries, adnexal
regions, and pelvic cul-de-sac.
It was necessary to proceed with endovaginal exam following the
transabdominal exam to visualize the left adnexa. Color and duplex
Doppler ultrasound was utilized to evaluate blood flow to the
ovaries.

[Series 1: us art/ven abd/pelv/scrotum doppler ltd · 0.24mm/px · 13 of 76 slices shown]
[im 1/76]
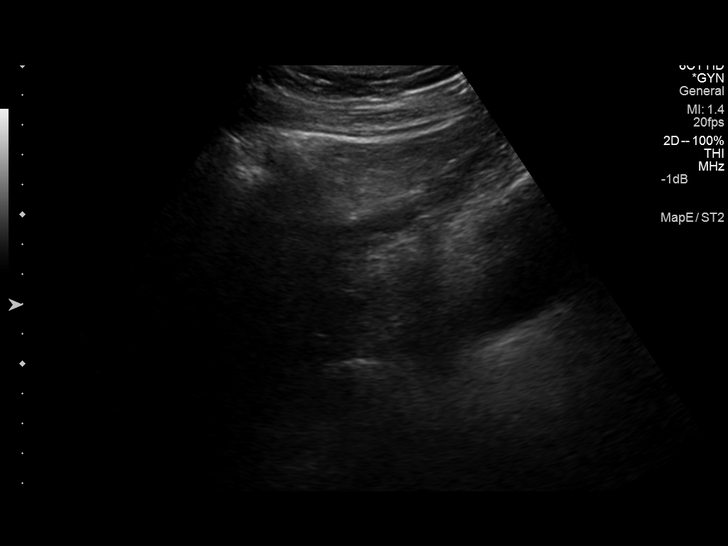
[im 7/76]
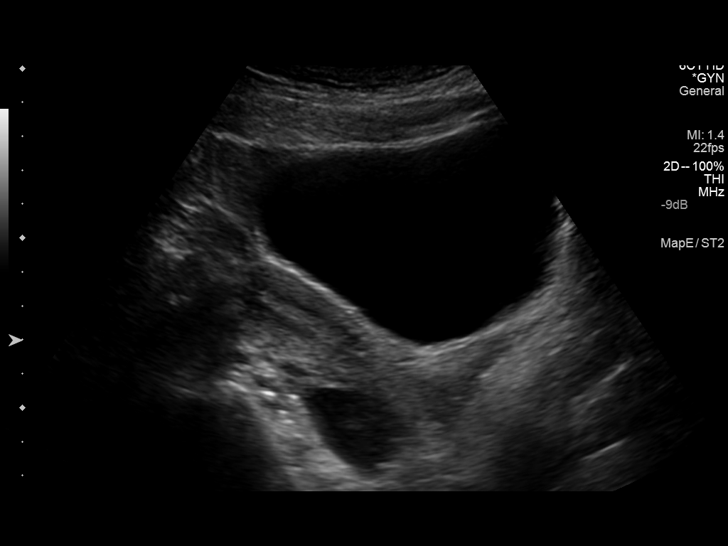
[im 13/76]
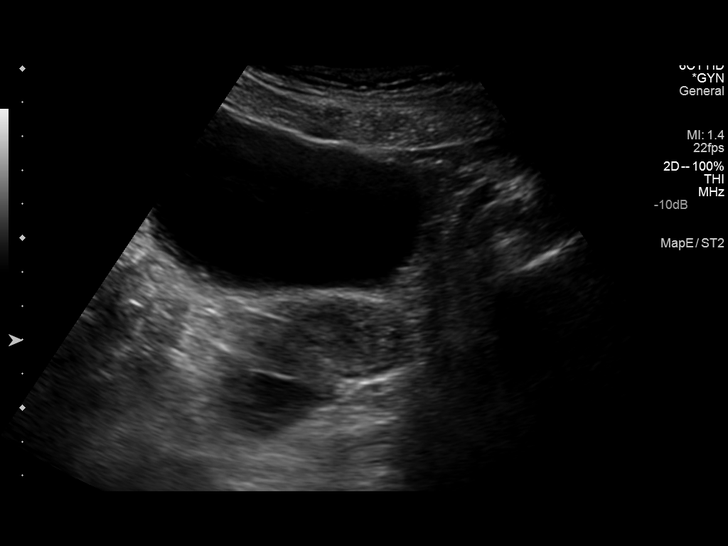
[im 19/76]
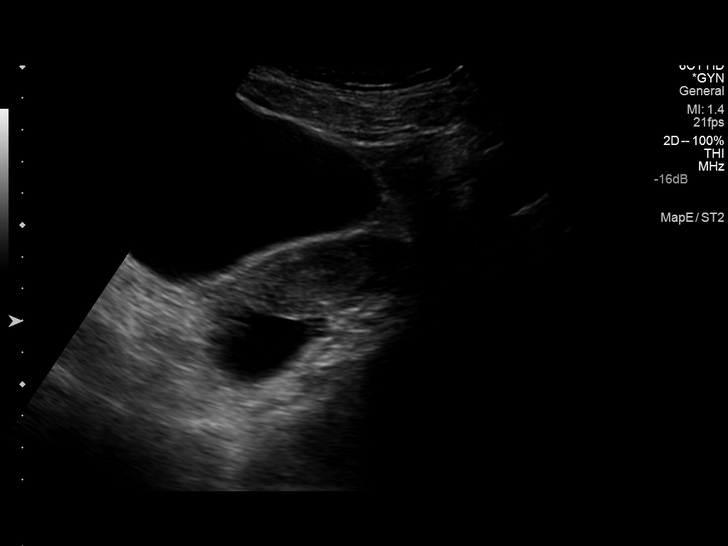
[im 26/76]
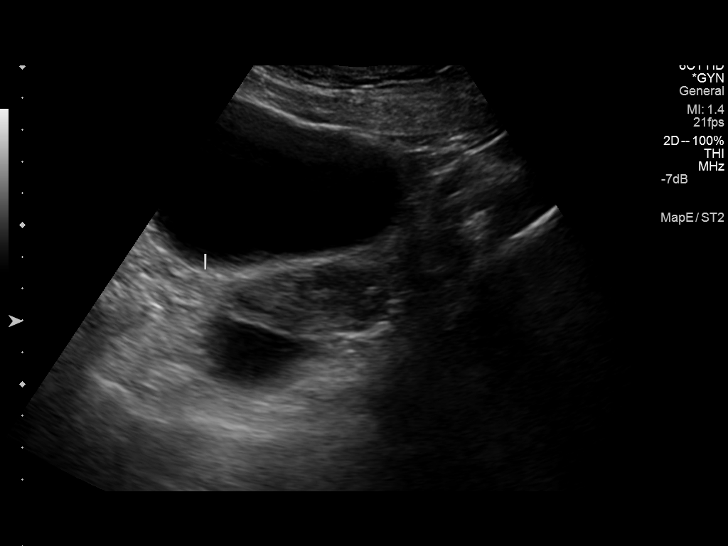
[im 32/76]
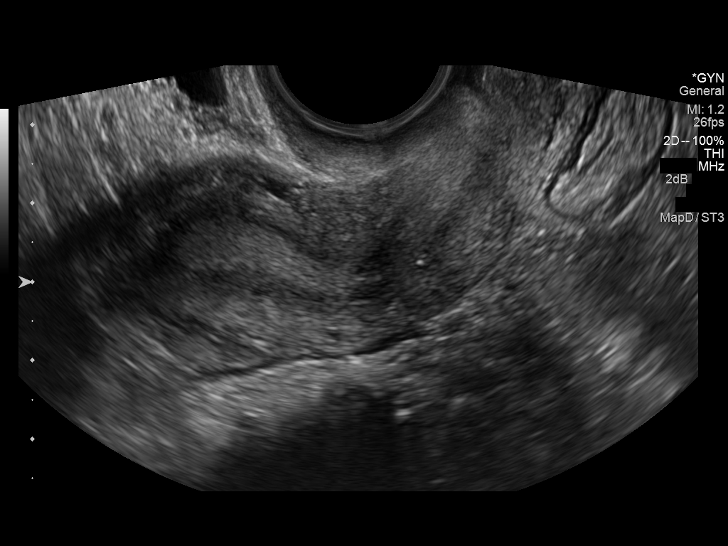
[im 38/76]
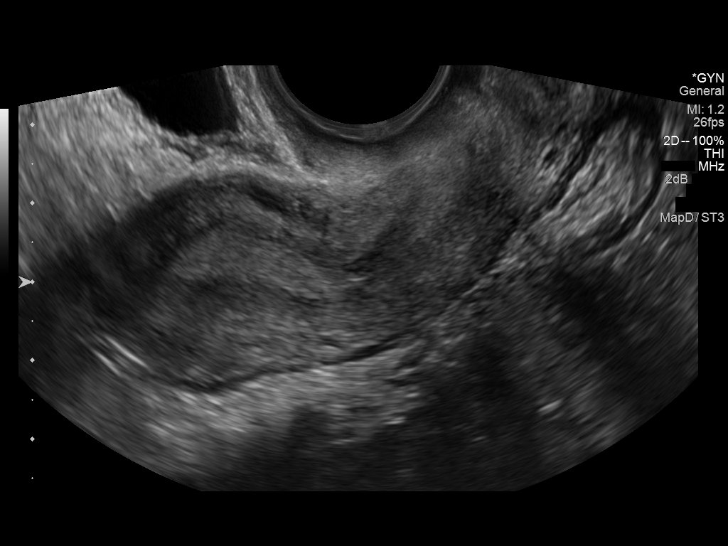
[im 44/76]
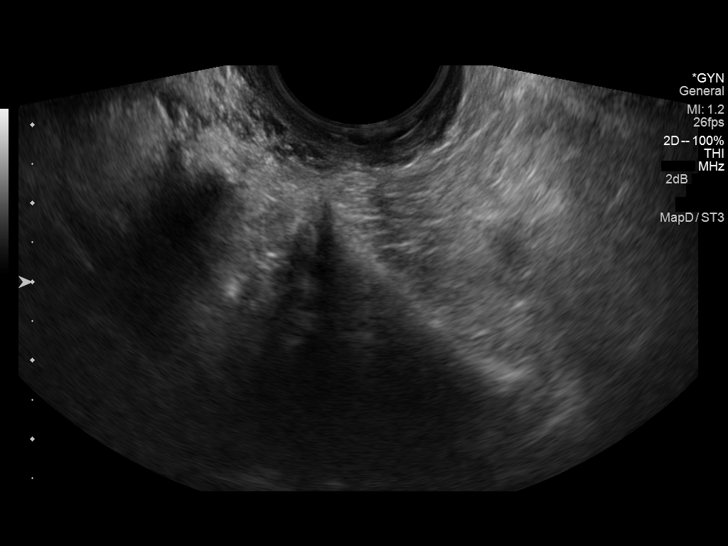
[im 51/76]
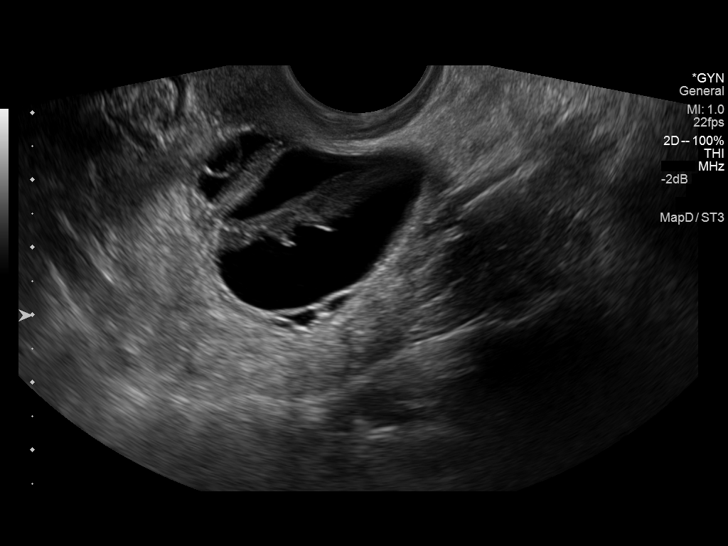
[im 57/76]
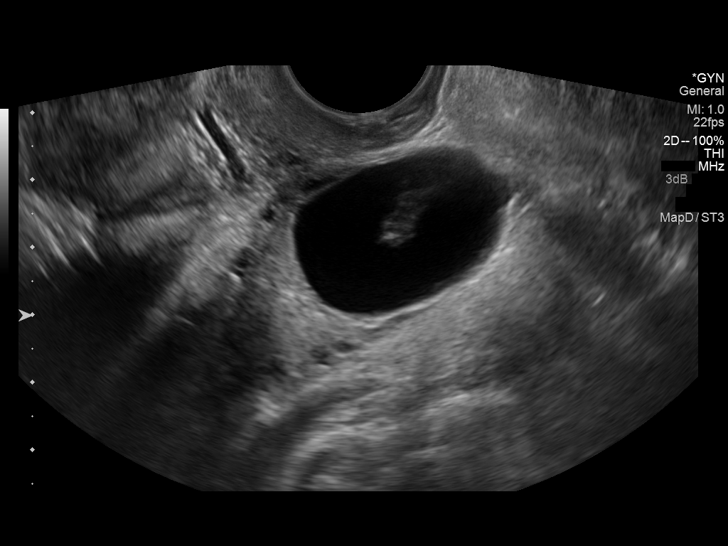
[im 63/76]
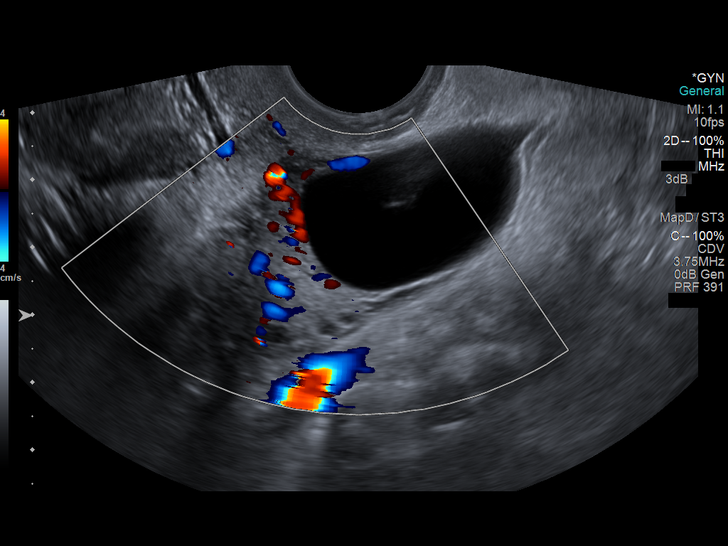
[im 69/76]
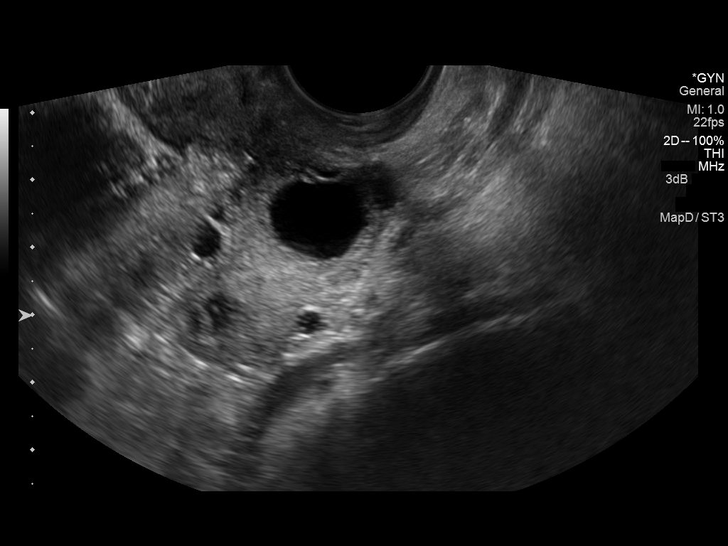
[im 76/76]
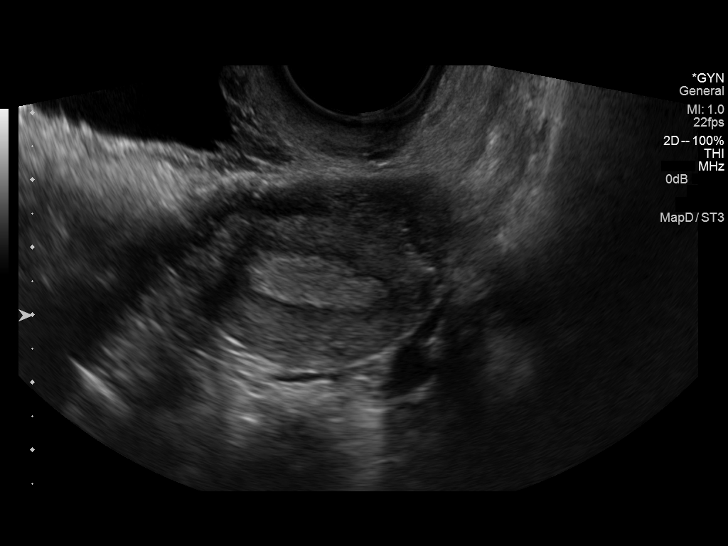

[13 of 25 positions shown; findings below may reference images not displayed]

FINDINGS: Uterus

Measurements: 5.8 x 2.4 x 4.4 cm. No fibroids or other mass
visualized.

Endometrium

Thickness: 7.4 mm.  No focal abnormality visualized.

Right ovary

Removed.

Left ovary

Measurements: 3.5 x 2.3 x 3.3 cm complex cyst in the otherwise
normal-appearing left ovary. Overall size is 5.4 x 2.9 x 2.6 cm.

Pulsed Doppler evaluation of both ovaries demonstrates normal
low-resistance arterial and venous waveforms.

Other findings

No free fluid.
IMPRESSION: Complex cyst on the left ovary, not present on 11/13/2014. I think
this represents a hemorrhagic cyst. I recommend a follow-up
transvaginal pelvic ultrasound and 8-12 weeks.

## 2016-01-31 IMAGING — CT CT ABD-PELV W/O CM
2 of 4 series · 6 of 46 positions shown, 8 images · non-contrast
Comparison: CT scan of January 28, 2006.

CLINICAL DATA: Acute left flank pain with gross hematuria.

EXAM:
CT ABDOMEN AND PELVIS WITHOUT CONTRAST
TECHNIQUE: Multidetector CT imaging of the abdomen and pelvis was performed
following the standard protocol without IV contrast.

[Series 204: cor · coronal · 0.50mm/px · 5 of 96 slices shown, 6 images]
[im 11/96  soft-tissue]
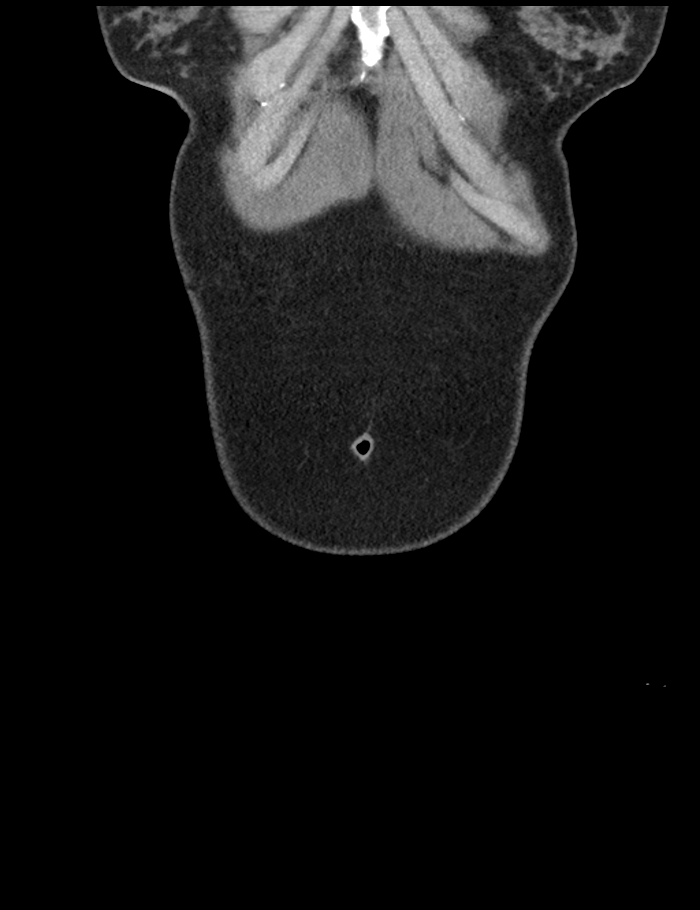
[im 11/96  bone]
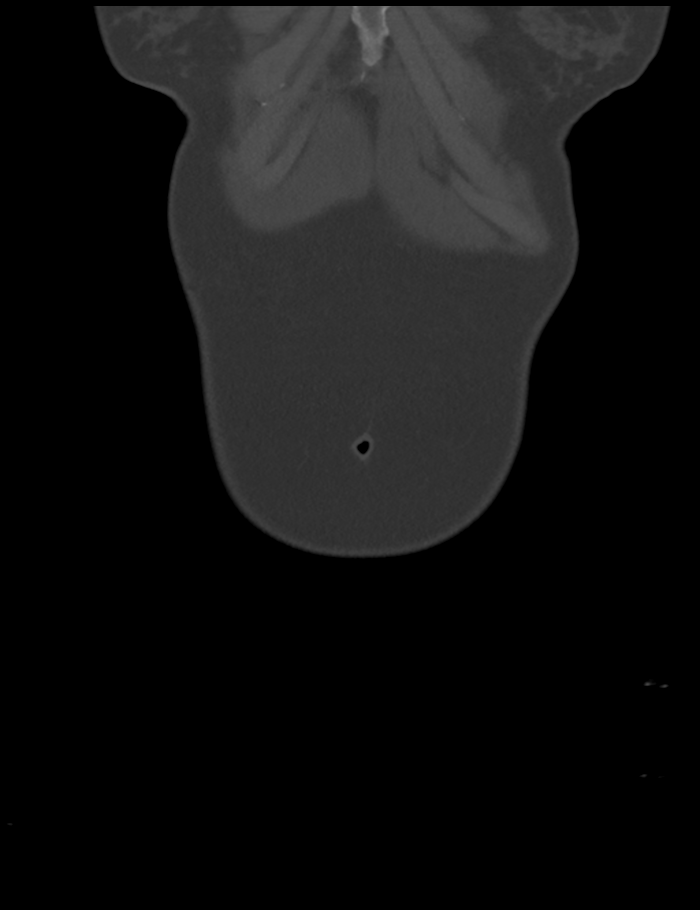
[im 32/96  soft-tissue]
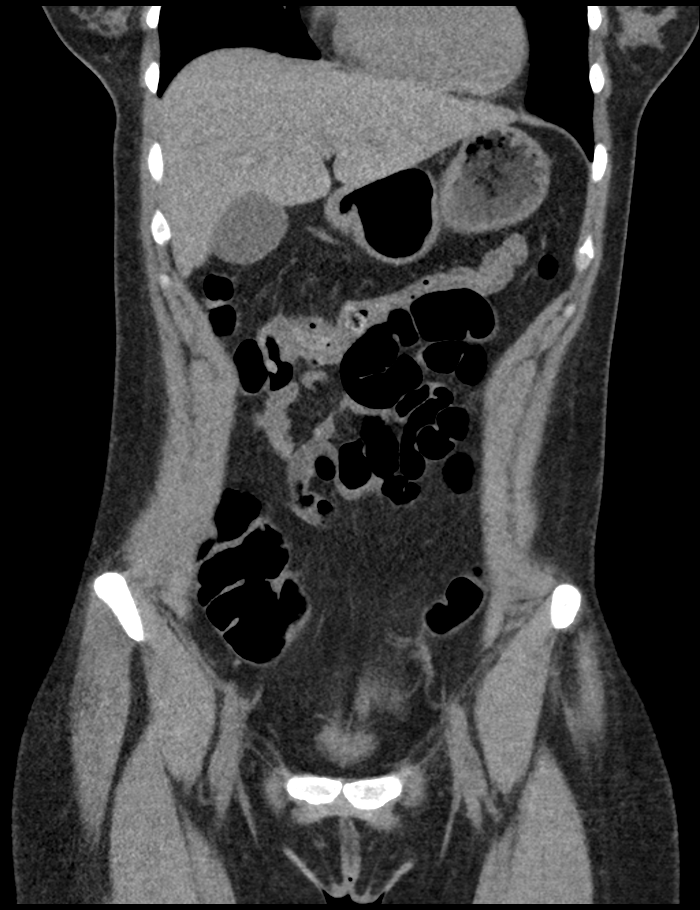
[im 53/96  soft-tissue]
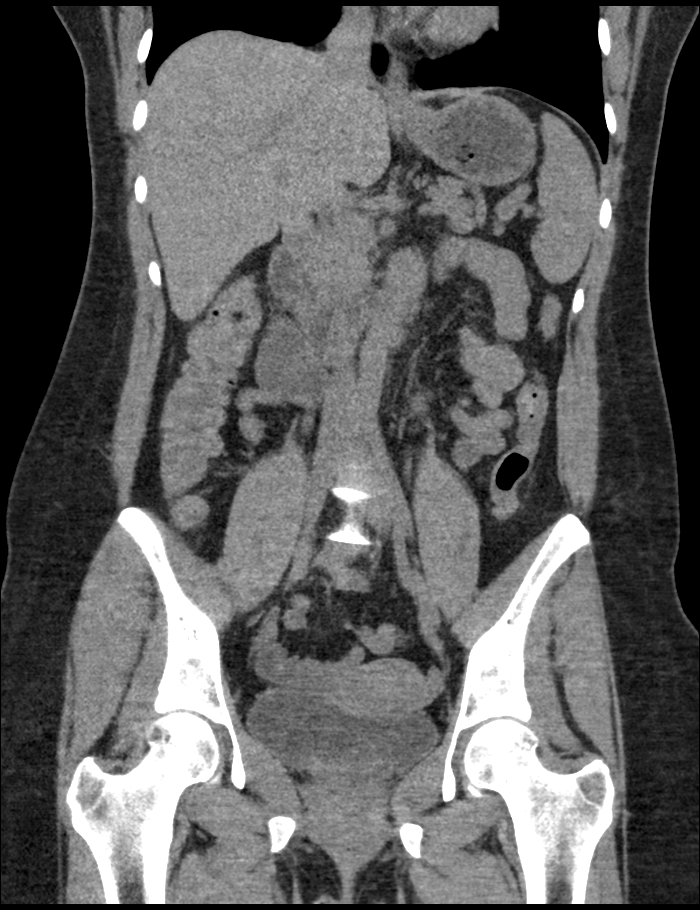
[im 64/96  soft-tissue]
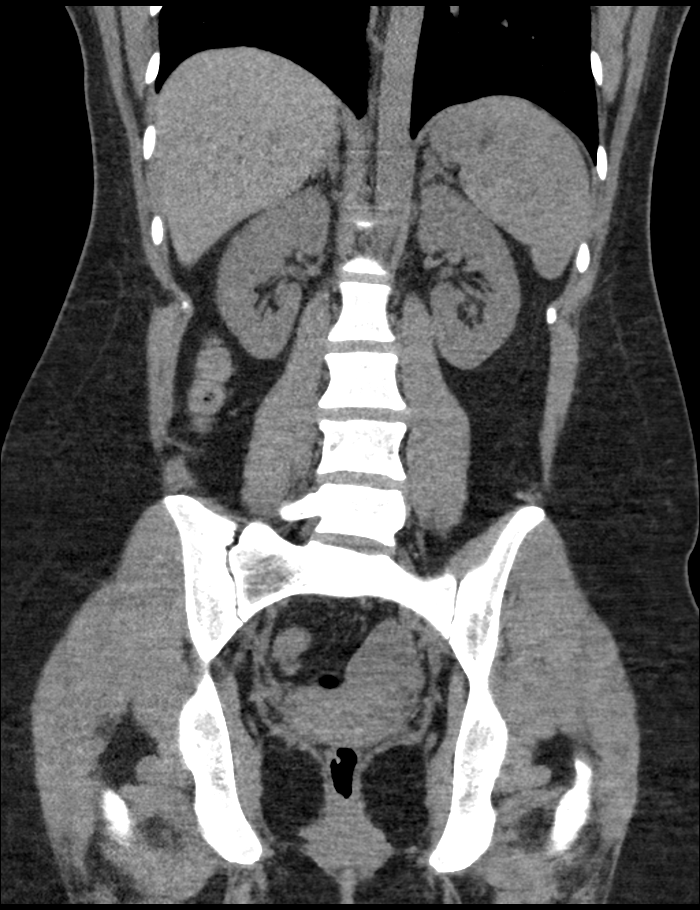
[im 85/96  soft-tissue]
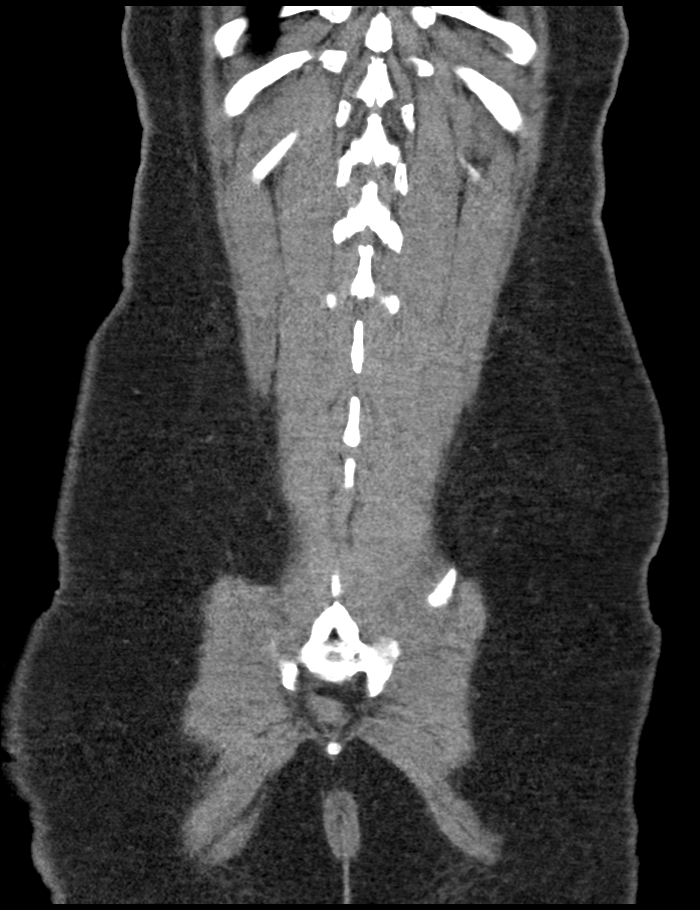

[Series 205: sag · sagittal · 0.50mm/px · 1 of 143 slices shown, 2 images]
[im 48/143  soft-tissue]
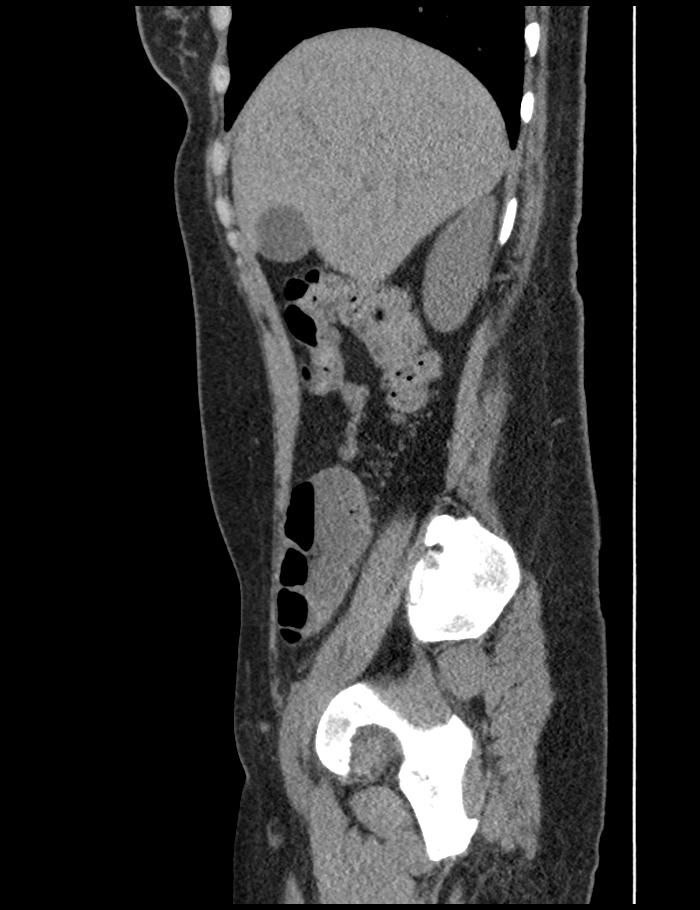
[im 48/143  bone]
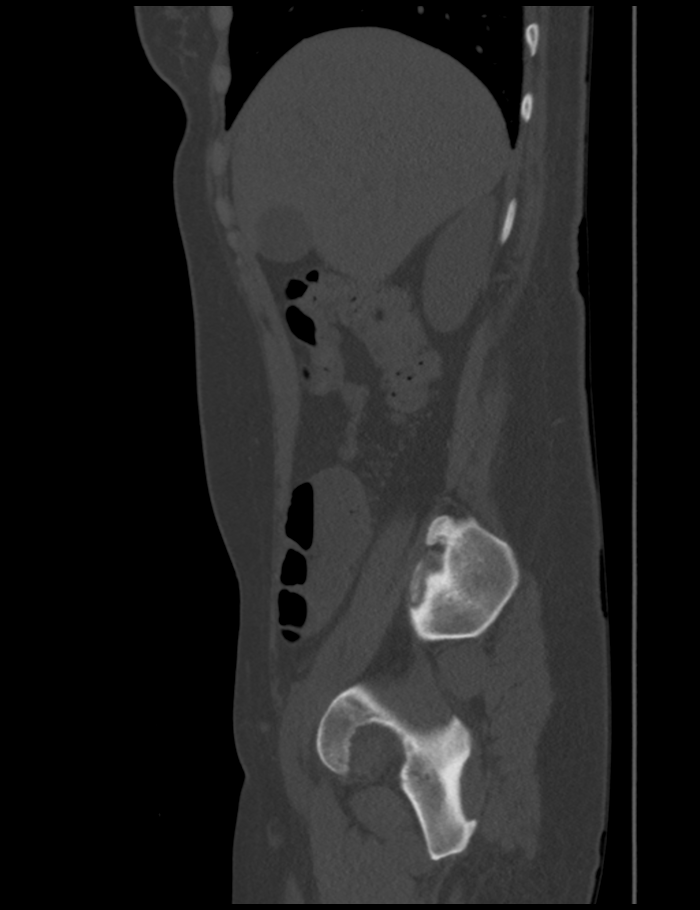

[6 of 46 positions shown; findings below may reference images not displayed]

FINDINGS: Visualized lung bases appear normal. No significant osseous
abnormality is noted.

No gallstones are noted. No focal abnormality is noted in the liver,
spleen or pancreas on these unenhanced images. Adrenal glands appear
normal. No hydronephrosis or renal obstruction is noted. No renal or
ureteral calculi are noted. There is no evidence of bowel
obstruction. No abnormal fluid collection is noted. Urinary bladder
and uterus appear normal. No significant adenopathy is noted. 4 cm
low density is seen in left adnexal region which may represent cyst.
IMPRESSION: No hydronephrosis or renal obstruction is noted. No renal or
ureteral calculi are noted.

4 cm low density is seen in left adnexal region which may represent
ovarian cyst. Pelvic ultrasound is recommended for further
evaluation.

## 2016-05-06 ENCOUNTER — Emergency Department (HOSPITAL_COMMUNITY)
Admission: EM | Admit: 2016-05-06 | Discharge: 2016-05-06 | Disposition: A | Discharge status: Home / Self Care | Payer: Medicaid Other | Attending: Emergency Medicine | Admitting: Emergency Medicine

## 2016-05-06 ENCOUNTER — Encounter (HOSPITAL_COMMUNITY): Payer: Self-pay | Admitting: *Deleted

## 2016-05-06 ENCOUNTER — Emergency Department (HOSPITAL_COMMUNITY): Payer: Medicaid Other

## 2016-05-06 DIAGNOSIS — Z7951 Long term (current) use of inhaled steroids: Secondary | ICD-10-CM | POA: Diagnosis not present

## 2016-05-06 DIAGNOSIS — F17210 Nicotine dependence, cigarettes, uncomplicated: Secondary | ICD-10-CM | POA: Insufficient documentation

## 2016-05-06 DIAGNOSIS — F419 Anxiety disorder, unspecified: Secondary | ICD-10-CM | POA: Diagnosis not present

## 2016-05-06 DIAGNOSIS — F209 Schizophrenia, unspecified: Secondary | ICD-10-CM | POA: Insufficient documentation

## 2016-05-06 DIAGNOSIS — Z79899 Other long term (current) drug therapy: Secondary | ICD-10-CM | POA: Diagnosis not present

## 2016-05-06 DIAGNOSIS — R1032 Left lower quadrant pain: Secondary | ICD-10-CM

## 2016-05-06 DIAGNOSIS — N83202 Unspecified ovarian cyst, left side: Secondary | ICD-10-CM | POA: Insufficient documentation

## 2016-05-06 DIAGNOSIS — F329 Major depressive disorder, single episode, unspecified: Secondary | ICD-10-CM | POA: Diagnosis not present

## 2016-05-06 DIAGNOSIS — Z8719 Personal history of other diseases of the digestive system: Secondary | ICD-10-CM | POA: Diagnosis not present

## 2016-05-06 DIAGNOSIS — Z3202 Encounter for pregnancy test, result negative: Secondary | ICD-10-CM | POA: Diagnosis not present

## 2016-05-06 DIAGNOSIS — R109 Unspecified abdominal pain: Secondary | ICD-10-CM | POA: Diagnosis present

## 2016-05-06 LAB — BASIC METABOLIC PANEL
Anion gap: 10 (ref 5–15)
BUN: 7 mg/dL (ref 6–20)
CALCIUM: 9.2 mg/dL (ref 8.9–10.3)
CO2: 23 mmol/L (ref 22–32)
CREATININE: 0.93 mg/dL (ref 0.44–1.00)
Chloride: 108 mmol/L (ref 101–111)
GFR calc non Af Amer: >60 mL/min (ref >60)
Glucose, Bld: 131 mg/dL — ABNORMAL HIGH (ref 65–99)
Potassium: 4.1 mmol/L (ref 3.5–5.1)
SODIUM: 141 mmol/L (ref 135–145)

## 2016-05-06 LAB — URINALYSIS, ROUTINE W REFLEX MICROSCOPIC
BILIRUBIN URINE: NEGATIVE
GLUCOSE, UA: NEGATIVE mg/dL
Hgb urine dipstick: NEGATIVE
Ketones, ur: NEGATIVE mg/dL
Leukocytes, UA: NEGATIVE
NITRITE: NEGATIVE
PH: 6.5 (ref 5.0–8.0)
Protein, ur: NEGATIVE mg/dL
Specific Gravity, Urine: 1.017 (ref 1.005–1.030)

## 2016-05-06 LAB — CBC
HCT: 38.4 % (ref 36.0–46.0)
Hemoglobin: 12.2 g/dL (ref 12.0–15.0)
MCH: 31.9 pg (ref 26.0–34.0)
MCHC: 31.8 g/dL (ref 30.0–36.0)
MCV: 100.5 fL — ABNORMAL HIGH (ref 78.0–100.0)
Platelets: 253 10*3/uL (ref 150–400)
RBC: 3.82 MIL/uL — ABNORMAL LOW (ref 3.87–5.11)
RDW: 14.4 % (ref 11.5–15.5)
WBC: 7.9 10*3/uL (ref 4.0–10.5)

## 2016-05-06 LAB — WET PREP, GENITAL
SPERM: NONE SEEN
Trich, Wet Prep: NONE SEEN
YEAST WET PREP: NONE SEEN

## 2016-05-06 LAB — CBG MONITORING, ED: Glucose-Capillary: 103 mg/dL — ABNORMAL HIGH (ref 65–99)

## 2016-05-06 LAB — POC URINE PREG, ED: PREG TEST UR: NEGATIVE

## 2016-05-06 MED ORDER — OXYCODONE-ACETAMINOPHEN 5-325 MG PO TABS: 1.0000 | ORAL_TABLET | ORAL | Status: DC | PRN

## 2016-05-06 MED ORDER — OXYCODONE HCL 5 MG PO TABS
10.0000 mg | ORAL_TABLET | Freq: Once | ORAL | Status: AC
  Administered 2016-05-06: 10 mg via ORAL
  Filled 2016-05-06: qty 2

## 2016-05-06 MED ORDER — SODIUM CHLORIDE 0.9 % IV BOLUS (SEPSIS): 1000.0000 mL | Freq: Once | INTRAVENOUS | Status: DC

## 2016-05-06 MED ORDER — ONDANSETRON 4 MG PO TBDP: ORAL_TABLET | ORAL | Status: DC

## 2016-05-06 NOTE — ED Notes (Signed)
Pt back from CT

## 2016-05-06 NOTE — ED Provider Notes (Signed)
CSN: [id number]     Arrival date & time 05/06/16  0111 History   First MD Initiated Contact with Patient 05/06/16 0131     Chief Complaint  Patient presents with  . Flank Pain  . Dysuria     (Consider location/radiation/quality/duration/timing/severity/associated sxs/prior Treatment) The history is provided by the patient and medical records. No language interpreter was used.     [name] is a 30 y.o. female  with a hx of depression, anxiety, ovarian cyst and right oophorectomy, presents to the Emergency Department complaining of gradual, persistent, progressively worsening left flank pain with radiation into the left abd onset 3 days ago worsening this morning. Associated symptoms include dysuria, urinary hesitancy.  Pt has taken ibuprofen without relief.  Pt is sexually active with 1 female partner.  NO hx of STD.  No vaginal discharge, sores.  Pt with family hx of kidney stones, no personal hx.  Nothing makes it worse.  Pt denies fever, chills, headache, neck pain, chest pain, SOB, changes in BM, frequency, urgency, hematuria.         Past Medical History  Diagnosis Date  . Depression   . Anxiety   . History of ovarian cyst   . Bartholin cyst LEFT SIDE  . Bicornuate uterus S/P EXCISION RIGHT UTERUS HORN 2007  . GERD (gastroesophageal reflux disease)     diet controlled - no meds  . Headache     sleep, xanax  . PTSD (post-traumatic stress disorder)   . Schizophrenia [facility])    Past Surgical History  Procedure Laterality Date  . Laparoscopic appendectomy and right ovarian cystectomy  09-04-2004  . Laparoscopic excision hypoplastic right uterine horn and right salpingoophectomy  03-18-2006  . Bartholin cyst marsupialization  07/18/2012    Procedure: BARTHOLIN CYST MARSUPIALIZATION;  Surgeon: Ok Edwards, MD;  Location: [facility];  Service: Gynecology;  Laterality: Left;  Left Bartholin Duct Marsupilization. One hour.  Thanks!  . Diagnostic laparoscopy     . Appendectomy    . Wisdom tooth extraction    . Bartholin gland cyst excision Left 02/11/2015    Procedure: BARTHOLIN GLAND EXCISION;  Surgeon: Mitchel Honour, DO;  Location: [facility];  Service: Gynecology;  Laterality: Left;   Family History  Problem Relation Age of Onset  . Diabetes Mother   . Hyperlipidemia Mother   . Bipolar disorder Mother   . Depression Mother   . Hypertension Maternal Grandmother   . Heart disease Maternal Grandmother 60  . Diabetes Maternal Grandfather   . Hypertension Maternal Grandfather   . Hyperlipidemia Maternal Grandfather   . Alcohol abuse Maternal Grandfather   . Breast cancer Paternal Grandmother   . Cancer Paternal Grandmother     breast  . Alcohol abuse Father   . Heart disease Father     not sure what it is  . Drug abuse Maternal Aunt   . Schizophrenia Cousin   . Cancer - Colon Paternal Grandfather    Social History  Substance Use Topics  . Smoking status: Current Every Day Smoker -- 0.50 packs/day for 12 years    Types: Cigarettes  . Smokeless tobacco: Never Used  . Alcohol Use: Yes     Comment: seldom   OB History    Gravida Para Term Preterm AB TAB SAB Ectopic Multiple Living   0              Review of Systems  Constitutional: Negative for fever, diaphoresis, appetite change, fatigue and unexpected  weight change.  HENT: Negative for mouth sores.   Eyes: Negative for visual disturbance.  Respiratory: Negative for cough, chest tightness, shortness of breath and wheezing.   Cardiovascular: Negative for chest pain.  Gastrointestinal: Positive for abdominal pain (LLQ). Negative for nausea, vomiting, diarrhea and constipation.  Endocrine: Negative for polydipsia, polyphagia and polyuria.  Genitourinary: Positive for dysuria and flank pain. Negative for urgency, frequency and hematuria.  Musculoskeletal: Negative for back pain and neck stiffness.  Skin: Negative for rash.  Allergic/Immunologic: Negative for immunocompromised state.   Neurological: Negative for syncope, light-headedness and headaches.  Hematological: Does not bruise/bleed easily.  Psychiatric/Behavioral: Negative for sleep disturbance. The patient is not nervous/anxious.       Allergies  Hydrocodone-acetaminophen  Home Medications   Prior to Admission medications   Medication Sig Start Date End Date Taking? Authorizing Provider  ALPRAZolam Prudy Feeler) 1 MG tablet Take 1 mg by mouth 2 (two) times daily as needed for anxiety.    Historical Provider, MD  citalopram (CELEXA) 20 MG tablet Take 30 mg by mouth daily.    Historical Provider, MD  Eszopiclone 3 MG TABS Take 3 mg by mouth at bedtime. Take immediately before bedtime    Historical Provider, MD  fluticasone (FLOVENT HFA) 110 MCG/ACT inhaler Inhale 1 puff into the lungs 2 (two) times daily. 10/27/15   Beatrice Lecher, PA-C  ibuprofen (ADVIL,MOTRIN) 800 MG tablet Take 800 mg by mouth every 8 (eight) hours as needed for headache or moderate pain.    Historical Provider, MD  Loratadine (CLARITIN) 10 MG CAPS Take 10 mg by mouth daily.    Historical Provider, MD  OLANZapine (ZYPREXA) 5 MG tablet Take 5 mg by mouth at bedtime.    Historical Provider, MD  ondansetron (ZOFRAN ODT) 4 MG disintegrating tablet 4mg  ODT q4 hours prn nausea/vomit 05/06/16   Eames Dibiasio, PA-C  oxyCODONE-acetaminophen (PERCOCET) 5-325 MG tablet Take 1-2 tablets by mouth every 4 (four) hours as needed. 05/06/16   Elvert Cumpton, PA-C  traZODone (DESYREL) 100 MG tablet Take 200 mg by mouth at bedtime.     Historical Provider, MD   BP 113/64 mmHg  Pulse 69  Temp(Src) 97.7 F (36.5 C) (Oral)  Resp 18  Ht 5\' 4"  (1.626 m)  Wt 70.308 kg  BMI 26.59 kg/m2  SpO2 100%  LMP 04/25/2016 Physical Exam  Constitutional: She appears well-developed and well-nourished. No distress.  HENT:  Head: Normocephalic and atraumatic.  Mouth/Throat: Oropharynx is clear and moist. No oropharyngeal exudate.  Eyes: Conjunctivae are normal.  Neck:  Normal range of motion.  Cardiovascular: Normal rate, regular rhythm, normal heart sounds and intact distal pulses.   No murmur heard. Pulmonary/Chest: Effort normal and breath sounds normal. No respiratory distress. She has no wheezes.  Abdominal: Soft. Bowel sounds are normal. She exhibits no distension and no mass. There is tenderness (LLQ) in the left lower quadrant. There is guarding and CVA tenderness (Left). There is no rebound. Hernia confirmed negative in the right inguinal area and confirmed negative in the left inguinal area.  Genitourinary: Uterus normal. No labial fusion. There is no rash, tenderness or lesion on the right labia. There is no rash, tenderness or lesion on the left labia. Uterus is not deviated, not enlarged, not fixed and not tender. Cervix exhibits no motion tenderness, no discharge and no friability. Right adnexum displays no mass, no tenderness and no fullness. Left adnexum displays mass and tenderness. Left adnexum displays no fullness. No erythema, tenderness or bleeding in the  vagina. No foreign body around the vagina. No signs of injury around the vagina. No vaginal discharge found.  Musculoskeletal: Normal range of motion. She exhibits no edema.  Lymphadenopathy:       Right: No inguinal adenopathy present.       Left: No inguinal adenopathy present.  Neurological: She is alert.  Skin: Skin is warm and dry. No rash noted. She is not diaphoretic. No erythema.  Psychiatric: She has a normal mood and affect.  Nursing note and vitals reviewed.   ED Course  Procedures (including critical care time) Labs Review Labs Reviewed  WET PREP, GENITAL - Abnormal; Notable for the following:    Clue Cells Wet Prep HPF POC PRESENT (*)    WBC, Wet Prep HPF POC FEW (*)    All other components within normal limits  CBC - Abnormal; Notable for the following:    RBC 3.82 (*)    MCV 100.5 (*)    All other components within normal limits  BASIC METABOLIC PANEL - Abnormal;  Notable for the following:    Glucose, Bld 131 (*)    All other components within normal limits  URINALYSIS, ROUTINE W REFLEX MICROSCOPIC (NOT AT [facility])  POC URINE PREG, ED  CBG MONITORING, ED  GC/CHLAMYDIA PROBE AMP ([place]) NOT AT [facility]    Imaging Review US Transvaginal Non-ob  05/06/2016  CLINICAL DATA:  Initial evaluation for acute left lower quadrant abdominal pain. EXAM: TRANSABDOMINAL AND TRANSVAGINAL ULTRASOUND OF PELVIS DOPPLER ULTRASOUND OF OVARIES TECHNIQUE: Both transabdominal and transvaginal ultrasound examinations of the pelvis were performed. Transabdominal technique was performed for global imaging of the pelvis including uterus, ovaries, adnexal regions, and pelvic cul-de-sac. It was necessary to proceed with endovaginal exam following the transabdominal exam to visualize the uterus and left ovary. Color and duplex Doppler ultrasound was utilized to evaluate blood flow to the ovaries. COMPARISON:  Prior CT from earlier same day. FINDINGS: Uterus Measurements: 7.1 x 3.5 x 4.1 cm. Uterus was retroflexed. No fibroids or other mass visualized. Endometrium Thickness: 7.8 mm.  No focal abnormality visualized. Right ovary Surgically removed. Left ovary Measurements: 4.9 x 4.1 x 4.5 cm. Complex heterogeneous cystic lesion measuring 4.5 x 3.5 x 3.2 cm. Lesion demonstrated internal lace-like are could texture with fluid fluid level. No internal vascularity. A complex hemorrhagic cyst is favored. Pulsed Doppler evaluation of the left ovary demonstrates normal low-resistance arterial and venous waveforms. Other findings Small volume free fluid within the pelvis, likely physiologic. IMPRESSION: 1. 4.5 x 3.5 x 3.2 cm complex left ovarian cystic lesion with internal lace-like architecture and fluid fluid level. An evolving hemorrhagic cyst is favored. While lesions such as these typically do not require follow-up, given the history of prior left adnexal lesion (seen on previous ultrasound from  01/10/2015), a follow-up ultrasound in 6-12 weeks is recommended to ensure resolution. No sonographic evidence for ovarian torsion. 2. Absent right ovary. 3. Small volume free fluid within the pelvis, likely physiologic. Electronically Signed   By: Rise Mu M.D.   On: 05/06/2016 05:04   US Pelvis Complete  05/06/2016  CLINICAL DATA:  Initial evaluation for acute left lower quadrant abdominal pain. EXAM: TRANSABDOMINAL AND TRANSVAGINAL ULTRASOUND OF PELVIS DOPPLER ULTRASOUND OF OVARIES TECHNIQUE: Both transabdominal and transvaginal ultrasound examinations of the pelvis were performed. Transabdominal technique was performed for global imaging of the pelvis including uterus, ovaries, adnexal regions, and pelvic cul-de-sac. It was necessary to proceed with endovaginal exam following the transabdominal exam to visualize the uterus  and left ovary. Color and duplex Doppler ultrasound was utilized to evaluate blood flow to the ovaries. COMPARISON:  Prior CT from earlier same day. FINDINGS: Uterus Measurements: 7.1 x 3.5 x 4.1 cm. Uterus was retroflexed. No fibroids or other mass visualized. Endometrium Thickness: 7.8 mm.  No focal abnormality visualized. Right ovary Surgically removed. Left ovary Measurements: 4.9 x 4.1 x 4.5 cm. Complex heterogeneous cystic lesion measuring 4.5 x 3.5 x 3.2 cm. Lesion demonstrated internal lace-like are could texture with fluid fluid level. No internal vascularity. A complex hemorrhagic cyst is favored. Pulsed Doppler evaluation of the left ovary demonstrates normal low-resistance arterial and venous waveforms. Other findings Small volume free fluid within the pelvis, likely physiologic. IMPRESSION: 1. 4.5 x 3.5 x 3.2 cm complex left ovarian cystic lesion with internal lace-like architecture and fluid fluid level. An evolving hemorrhagic cyst is favored. While lesions such as these typically do not require follow-up, given the history of prior left adnexal lesion (seen on  previous ultrasound from 01/10/2015), a follow-up ultrasound in 6-12 weeks is recommended to ensure resolution. No sonographic evidence for ovarian torsion. 2. Absent right ovary. 3. Small volume free fluid within the pelvis, likely physiologic. Electronically Signed   By: Rise Mu M.D.   On: 05/06/2016 05:04   [place] Art/ven Flow Abd Pelv Doppler  05/06/2016  CLINICAL DATA:  Initial evaluation for acute left lower quadrant abdominal pain. EXAM: TRANSABDOMINAL AND TRANSVAGINAL ULTRASOUND OF PELVIS DOPPLER ULTRASOUND OF OVARIES TECHNIQUE: Both transabdominal and transvaginal ultrasound examinations of the pelvis were performed. Transabdominal technique was performed for global imaging of the pelvis including uterus, ovaries, adnexal regions, and pelvic cul-de-sac. It was necessary to proceed with endovaginal exam following the transabdominal exam to visualize the uterus and left ovary. Color and duplex Doppler ultrasound was utilized to evaluate blood flow to the ovaries. COMPARISON:  Prior CT from earlier same day. FINDINGS: Uterus Measurements: 7.1 x 3.5 x 4.1 cm. Uterus was retroflexed. No fibroids or other mass visualized. Endometrium Thickness: 7.8 mm.  No focal abnormality visualized. Right ovary Surgically removed. Left ovary Measurements: 4.9 x 4.1 x 4.5 cm. Complex heterogeneous cystic lesion measuring 4.5 x 3.5 x 3.2 cm. Lesion demonstrated internal lace-like are could texture with fluid fluid level. No internal vascularity. A complex hemorrhagic cyst is favored. Pulsed Doppler evaluation of the left ovary demonstrates normal low-resistance arterial and venous waveforms. Other findings Small volume free fluid within the pelvis, likely physiologic. IMPRESSION: 1. 4.5 x 3.5 x 3.2 cm complex left ovarian cystic lesion with internal lace-like architecture and fluid fluid level. An evolving hemorrhagic cyst is favored. While lesions such as these typically do not require follow-up, given the history of  prior left adnexal lesion (seen on previous ultrasound from 01/10/2015), a follow-up ultrasound in 6-12 weeks is recommended to ensure resolution. No sonographic evidence for ovarian torsion. 2. Absent right ovary. 3. Small volume free fluid within the pelvis, likely physiologic. Electronically Signed   By: Rise Mu M.D.   On: 05/06/2016 05:04   Ct Renal Stone Study  05/06/2016  CLINICAL DATA:  Left flank pain for 3 days. EXAM: CT ABDOMEN AND PELVIS WITHOUT CONTRAST TECHNIQUE: Multidetector CT imaging of the abdomen and pelvis was performed following the standard protocol without IV contrast. COMPARISON:  01/10/2015 FINDINGS: Mild dependent changes in the lung bases. The kidneys are symmetrical in size and shape. No hydronephrosis or hydroureter. No renal, ureteral, or bladder stones. Bladder wall is not thickened. The unenhanced appearance of the liver, spleen,  gallbladder, pancreas, adrenal glands, abdominal aorta, inferior vena cava, and retroperitoneal lymph nodes is unremarkable. Stomach, small bowel, and colon are not abnormally distended. Scattered stool in the colon. No free air or free fluid in the abdomen. Pelvis: Appendix is surgically absent. There is an enlarging complex mass in the left adnexa, now measuring 4.1 x 6.7 cm. Previously 4 cm maximal dimension. Suggest further characterization with ultrasound. Uterus is not enlarged. No free or loculated pelvic fluid collections. No pelvic lymphadenopathy. No destructive bone lesions. IMPRESSION: No renal or ureteral stone or obstruction. Enlarging complex mass in the left adnexae. Ultrasound suggested for further characterization. Electronically Signed   By: Burman Nieves M.D.   On: 05/06/2016 03:34   I have personally reviewed and evaluated these images and lab results as part of my medical decision-making.    MDM   Final diagnoses:  Left lower quadrant pain  Left ovarian cyst   [name] presents with LLQ and left flank  pain.  No hx of stones.  Labs reassuring.  No evidence of UTI.  Will obtain CT renal.    3:55 AM CT with complex mass on the left ovary.  Pelvic with palpable mass and tenderness to the left adnexa.  Will obtain pelvic US.    5:49 AM US shows complex mass with fluid filled levels, favoring evolving hemorrhagic cyst.  Less likely ovarian cancer.  Her shows good blood flow. No evidence of torsion. Discussed these findings with patient, but stressed the importance of close follow-up with OB/GYN for repeat exam and ultrasound. Discussed at length risk for ovarian torsion and reasons to return immediately to the emergency department.  She states understanding and is in agreement with the plan. He'll be discharged home with a short course of pain control.  BP 113/64 mmHg  Pulse 69  Temp(Src) 97.7 F (36.5 C) (Oral)  Resp 18  Ht 5\' 4"  (1.626 m)  Wt 70.308 kg  BMI 26.59 kg/m2  SpO2 100%  LMP 04/25/2016   Dierdre Forth, PA-C 05/06/16 0551  Layla Maw Ward, DO 05/06/16 [id number]

## 2016-05-06 NOTE — ED Notes (Signed)
Patient transported to Ultrasound 

## 2016-05-06 NOTE — Discharge Instructions (Signed)
1. Medications: zofran, percocet, usual home medications 2. Treatment: rest, drink plenty of fluids, advance diet slowly 3. Follow Up: Please followup with your primary doctor in 2 days for discussion of your diagnoses and further evaluation after today's visit; if you do not have a primary care doctor use the resource guide provided to find one; Please return to the ER for persistent vomiting, high fevers or worsening symptoms   Ovarian Cyst An ovarian cyst is a fluid-filled sac that forms on an ovary. The ovaries are small organs that produce eggs in women. Various types of cysts can form on the ovaries. Most are not cancerous. Many do not cause problems, and they often go away on their own. Some may cause symptoms and require treatment. Common types of ovarian cysts include:  Functional cysts--These cysts may occur every month during the menstrual cycle. This is normal. The cysts usually go away with the next menstrual cycle if the woman does not get pregnant. Usually, there are no symptoms with a functional cyst.  Endometrioma cysts--These cysts form from the tissue that lines the uterus. They are also called "chocolate cysts" because they become filled with blood that turns brown. This type of cyst can cause pain in the lower abdomen during intercourse and with your menstrual period.  Cystadenoma cysts--This type develops from the cells on the outside of the ovary. These cysts can get very big and cause lower abdomen pain and pain with intercourse. This type of cyst can twist on itself, cut off its blood supply, and cause severe pain. It can also easily rupture and cause a lot of pain.  Dermoid cysts--This type of cyst is sometimes found in both ovaries. These cysts may contain different kinds of body tissue, such as skin, teeth, hair, or cartilage. They usually do not cause symptoms unless they get very big.  Theca lutein cysts--These cysts occur when too much of a certain hormone (human  chorionic gonadotropin) is produced and overstimulates the ovaries to produce an egg. This is most common after procedures used to assist with the conception of a baby (in vitro fertilization). CAUSES   Fertility drugs can cause a condition in which multiple large cysts are formed on the ovaries. This is called ovarian hyperstimulation syndrome.  A condition called polycystic ovary syndrome can cause hormonal imbalances that can lead to nonfunctional ovarian cysts. SIGNS AND SYMPTOMS  Many ovarian cysts do not cause symptoms. If symptoms are present, they may include:  Pelvic pain or pressure.  Pain in the lower abdomen.  Pain during sexual intercourse.  Increasing girth (swelling) of the abdomen.  Abnormal menstrual periods.  Increasing pain with menstrual periods.  Stopping having menstrual periods without being pregnant. DIAGNOSIS  These cysts are commonly found during a routine or annual pelvic exam. Tests may be ordered to find out more about the cyst. These tests may include:  Ultrasound.  X-ray of the pelvis.  CT scan.  MRI.  Blood tests. TREATMENT  Many ovarian cysts go away on their own without treatment. Your health care provider may want to check your cyst regularly for 2-3 months to see if it changes. For women in menopause, it is particularly important to monitor a cyst closely because of the higher rate of ovarian cancer in menopausal women. When treatment is needed, it may include any of the following:  A procedure to drain the cyst (aspiration). This may be done using a long needle and ultrasound. It can also be done through a laparoscopic  procedure. This involves using a thin, lighted tube with a tiny camera on the end (laparoscope) inserted through a small incision.  Surgery to remove the whole cyst. This may be done using laparoscopic surgery or an open surgery involving a larger incision in the lower abdomen.  Hormone treatment or birth control pills.  These methods are sometimes used to help dissolve a cyst. HOME CARE INSTRUCTIONS   Only take over-the-counter or prescription medicines as directed by your health care provider.  Follow up with your health care provider as directed.  Get regular pelvic exams and Pap tests. SEEK MEDICAL CARE IF:   Your periods are late, irregular, or painful, or they stop.  Your pelvic pain or abdominal pain does not go away.  Your abdomen becomes larger or swollen.  You have pressure on your bladder or trouble emptying your bladder completely.  You have pain during sexual intercourse.  You have feelings of fullness, pressure, or discomfort in your stomach.  You lose weight for no apparent reason.  You feel generally ill.  You become constipated.  You lose your appetite.  You develop acne.  You have an increase in body and facial hair.  You are gaining weight, without changing your exercise and eating habits.  You think you are pregnant. SEEK IMMEDIATE MEDICAL CARE IF:   You have increasing abdominal pain.  You feel sick to your stomach (nauseous), and you throw up (vomit).  You develop a fever that comes on suddenly.  You have abdominal pain during a bowel movement.  Your menstrual periods become heavier than usual. MAKE SURE YOU:  Understand these instructions.  Will watch your condition.  Will get help right away if you are not doing well or get worse.   This information is not intended to replace advice given to you by your health care provider. Make sure you discuss any questions you have with your health care provider.   Document Released: 12/17/2005 Document Revised: 12/22/2013 Document Reviewed: 08/24/2013 Elsevier Interactive Patient Education [employer]

## 2016-05-06 NOTE — ED Notes (Signed)
Patient presents with c/o left flank pain that radiates around to the left lower abd.  States it takes her awhile to urinate and then it hurts while urinating

## 2016-05-06 NOTE — ED Notes (Signed)
Patient transported to CT 

## 2016-05-06 NOTE — ED Notes (Signed)
PA-C at bedside 

## 2016-05-07 LAB — GC/CHLAMYDIA PROBE AMP (~~LOC~~) NOT AT ARMC
CHLAMYDIA, DNA PROBE: NEGATIVE
Neisseria Gonorrhea: NEGATIVE

## 2016-05-08 ENCOUNTER — Other Ambulatory Visit: Payer: Self-pay | Admitting: Obstetrics

## 2016-05-21 ENCOUNTER — Ambulatory Visit (INDEPENDENT_AMBULATORY_CARE_PROVIDER_SITE_OTHER): Payer: Medicaid Other | Admitting: Obstetrics & Gynecology

## 2016-05-21 ENCOUNTER — Encounter: Payer: Self-pay | Admitting: Obstetrics & Gynecology

## 2016-05-21 VITALS — BP 136/83 | HR 100 | Ht 64.0 in | Wt 164.0 lb

## 2016-05-21 DIAGNOSIS — N83209 Unspecified ovarian cyst, unspecified side: Secondary | ICD-10-CM | POA: Insufficient documentation

## 2016-05-21 DIAGNOSIS — N83202 Unspecified ovarian cyst, left side: Secondary | ICD-10-CM | POA: Diagnosis not present

## 2016-05-21 MED ORDER — NORETHIN ACE-ETH ESTRAD-FE 1-20 MG-MCG(24) PO TABS: 1.0000 | ORAL_TABLET | Freq: Every day | ORAL | Status: DC

## 2016-05-21 MED ORDER — NAPROXEN 500 MG PO TABS: 500.0000 mg | ORAL_TABLET | Freq: Two times a day (BID) | ORAL | Status: DC

## 2016-05-21 NOTE — Progress Notes (Signed)
CLINIC ENCOUNTER NOTE  History:  30 y.o. G0P0 here today for follow up of left ovarian cyst. Has recurrent left ovarian physiologic cysts.  Cyst associated with pelvic pain, not alleviated by Ibuprofen.  She has a h/o RSO (see PSH below), does not want to lose this ovary.  She denies any abnormal vaginal discharge, bleeding, or other concerns.   Past Medical History  Diagnosis Date  . Depression   . Anxiety   . History of ovarian cyst   . Bartholin cyst LEFT SIDE  . Bicornuate uterus S/P EXCISION RIGHT UTERUS HORN 2007  . GERD (gastroesophageal reflux disease)     diet controlled - no meds  . Headache     sleep, xanax  . PTSD (post-traumatic stress disorder)   . Schizophrenia [facility])     Past Surgical History  Procedure Laterality Date  . Laparoscopic appendectomy and right ovarian cystectomy  09-04-2004  . Laparoscopic excision hypoplastic right uterine horn and right salpingoophectomy  03-18-2006  . Bartholin cyst marsupialization  07/18/2012    Procedure: BARTHOLIN CYST MARSUPIALIZATION;  Surgeon: Ok Edwards, MD;  Location: [facility];  Service: Gynecology;  Laterality: Left;  Left Bartholin Duct Marsupilization. One hour.   . Diagnostic laparoscopy    . Appendectomy    . Wisdom tooth extraction    . Bartholin gland cyst excision Left 02/11/2015    Procedure: BARTHOLIN GLAND EXCISION;  Surgeon: Mitchel Honour, DO;  Location: [facility];  Service: Gynecology;  Laterality: Left;    The following portions of the patient's history were reviewed and updated as appropriate: allergies, current medications, past family history, past medical history, past social history, past surgical history and problem list.   Health Maintenance:  Normal pap on 03/26/2012.    Review of Systems:  Pertinent items noted in HPI and remainder of comprehensive ROS otherwise negative.  Objective:  Physical Exam BP 136/83 mmHg  Pulse 100  Ht 5\' 4"  (1.626 m)  Wt 164 lb (74.39 kg)  BMI  28.14 kg/m2  LMP 05/19/2016 CONSTITUTIONAL: Well-developed, well-nourished female in no acute distress.  HENT:  Normocephalic, atraumatic. External right and left ear normal. Oropharynx is clear and moist EYES: Conjunctivae and EOM are normal. Pupils are equal, round, and reactive to light. No scleral icterus.  NECK: Normal range of motion, supple, no masses SKIN: Skin is warm and dry. No rash noted. Not diaphoretic. No erythema. No pallor. NEUROLOGIC: Alert and oriented to person, place, and time. Normal reflexes, muscle tone coordination. No cranial nerve deficit noted. PSYCHIATRIC: Normal mood and affect. Normal behavior. Normal judgment and thought content. CARDIOVASCULAR: Normal heart rate noted RESPIRATORY: Effort and breath sounds normal, no problems with respiration noted ABDOMEN: Soft, no distention noted.  Mild LLQ TTP, no rebound or guarding PELVIC: Deferred MUSCULOSKELETAL: Normal range of motion. No edema noted.  Labs and Imaging US Transvaginal Non-ob  05/06/2016  CLINICAL DATA:  Initial evaluation for acute left lower quadrant abdominal pain. EXAM: TRANSABDOMINAL AND TRANSVAGINAL ULTRASOUND OF PELVIS DOPPLER ULTRASOUND OF OVARIES TECHNIQUE: Both transabdominal and transvaginal ultrasound examinations of the pelvis were performed. Transabdominal technique was performed for global imaging of the pelvis including uterus, ovaries, adnexal regions, and pelvic cul-de-sac. It was necessary to proceed with endovaginal exam following the transabdominal exam to visualize the uterus and left ovary. Color and duplex Doppler ultrasound was utilized to evaluate blood flow to the ovaries. COMPARISON:  Prior CT from earlier same day. FINDINGS: Uterus Measurements: 7.1 x 3.5 x 4.1 cm. Uterus  was retroflexed. No fibroids or other mass visualized. Endometrium Thickness: 7.8 mm.  No focal abnormality visualized. Right ovary Surgically removed. Left ovary Measurements: 4.9 x 4.1 x 4.5 cm. Complex  heterogeneous cystic lesion measuring 4.5 x 3.5 x 3.2 cm. Lesion demonstrated internal lace-like are could texture with fluid fluid level. No internal vascularity. A complex hemorrhagic cyst is favored. Pulsed Doppler evaluation of the left ovary demonstrates normal low-resistance arterial and venous waveforms. Other findings Small volume free fluid within the pelvis, likely physiologic. IMPRESSION: 1. 4.5 x 3.5 x 3.2 cm complex left ovarian cystic lesion with internal lace-like architecture and fluid fluid level. An evolving hemorrhagic cyst is favored. While lesions such as these typically do not require follow-up, given the history of prior left adnexal lesion (seen on previous ultrasound from 01/10/2015), a follow-up ultrasound in 6-12 weeks is recommended to ensure resolution. No sonographic evidence for ovarian torsion. 2. Absent right ovary. 3. Small volume free fluid within the pelvis, likely physiologic. Electronically Signed   By: Rise Mu M.D.   On: 05/06/2016 05:04   US Pelvis Complete  05/06/2016  CLINICAL DATA:  Initial evaluation for acute left lower quadrant abdominal pain. EXAM: TRANSABDOMINAL AND TRANSVAGINAL ULTRASOUND OF PELVIS DOPPLER ULTRASOUND OF OVARIES TECHNIQUE: Both transabdominal and transvaginal ultrasound examinations of the pelvis were performed. Transabdominal technique was performed for global imaging of the pelvis including uterus, ovaries, adnexal regions, and pelvic cul-de-sac. It was necessary to proceed with endovaginal exam following the transabdominal exam to visualize the uterus and left ovary. Color and duplex Doppler ultrasound was utilized to evaluate blood flow to the ovaries. COMPARISON:  Prior CT from earlier same day. FINDINGS: Uterus Measurements: 7.1 x 3.5 x 4.1 cm. Uterus was retroflexed. No fibroids or other mass visualized. Endometrium Thickness: 7.8 mm.  No focal abnormality visualized. Right ovary Surgically removed. Left ovary Measurements: 4.9 x  4.1 x 4.5 cm. Complex heterogeneous cystic lesion measuring 4.5 x 3.5 x 3.2 cm. Lesion demonstrated internal lace-like are could texture with fluid fluid level. No internal vascularity. A complex hemorrhagic cyst is favored. Pulsed Doppler evaluation of the left ovary demonstrates normal low-resistance arterial and venous waveforms. Other findings Small volume free fluid within the pelvis, likely physiologic. IMPRESSION: 1. 4.5 x 3.5 x 3.2 cm complex left ovarian cystic lesion with internal lace-like architecture and fluid fluid level. An evolving hemorrhagic cyst is favored. While lesions such as these typically do not require follow-up, given the history of prior left adnexal lesion (seen on previous ultrasound from 01/10/2015), a follow-up ultrasound in 6-12 weeks is recommended to ensure resolution. No sonographic evidence for ovarian torsion. 2. Absent right ovary. 3. Small volume free fluid within the pelvis, likely physiologic. Electronically Signed   By: Rise Mu M.D.   On: 05/06/2016 05:04   [place] Art/ven Flow Abd Pelv Doppler  05/06/2016  CLINICAL DATA:  Initial evaluation for acute left lower quadrant abdominal pain. EXAM: TRANSABDOMINAL AND TRANSVAGINAL ULTRASOUND OF PELVIS DOPPLER ULTRASOUND OF OVARIES TECHNIQUE: Both transabdominal and transvaginal ultrasound examinations of the pelvis were performed. Transabdominal technique was performed for global imaging of the pelvis including uterus, ovaries, adnexal regions, and pelvic cul-de-sac. It was necessary to proceed with endovaginal exam following the transabdominal exam to visualize the uterus and left ovary. Color and duplex Doppler ultrasound was utilized to evaluate blood flow to the ovaries. COMPARISON:  Prior CT from earlier same day. FINDINGS: Uterus Measurements: 7.1 x 3.5 x 4.1 cm. Uterus was retroflexed. No fibroids or other mass  visualized. Endometrium Thickness: 7.8 mm.  No focal abnormality visualized. Right ovary Surgically  removed. Left ovary Measurements: 4.9 x 4.1 x 4.5 cm. Complex heterogeneous cystic lesion measuring 4.5 x 3.5 x 3.2 cm. Lesion demonstrated internal lace-like are could texture with fluid fluid level. No internal vascularity. A complex hemorrhagic cyst is favored. Pulsed Doppler evaluation of the left ovary demonstrates normal low-resistance arterial and venous waveforms. Other findings Small volume free fluid within the pelvis, likely physiologic. IMPRESSION: 1. 4.5 x 3.5 x 3.2 cm complex left ovarian cystic lesion with internal lace-like architecture and fluid fluid level. An evolving hemorrhagic cyst is favored. While lesions such as these typically do not require follow-up, given the history of prior left adnexal lesion (seen on previous ultrasound from 01/10/2015), a follow-up ultrasound in 6-12 weeks is recommended to ensure resolution. No sonographic evidence for ovarian torsion. 2. Absent right ovary. 3. Small volume free fluid within the pelvis, likely physiologic. Electronically Signed   By: Rise Mu M.D.   On: 05/06/2016 05:04   Ct Renal Stone Study  05/06/2016  CLINICAL DATA:  Left flank pain for 3 days. EXAM: CT ABDOMEN AND PELVIS WITHOUT CONTRAST TECHNIQUE: Multidetector CT imaging of the abdomen and pelvis was performed following the standard protocol without IV contrast. COMPARISON:  01/10/2015 FINDINGS: Mild dependent changes in the lung bases. The kidneys are symmetrical in size and shape. No hydronephrosis or hydroureter. No renal, ureteral, or bladder stones. Bladder wall is not thickened. The unenhanced appearance of the liver, spleen, gallbladder, pancreas, adrenal glands, abdominal aorta, inferior vena cava, and retroperitoneal lymph nodes is unremarkable. Stomach, small bowel, and colon are not abnormally distended. Scattered stool in the colon. No free air or free fluid in the abdomen. Pelvis: Appendix is surgically absent. There is an enlarging complex mass in the left adnexa,  now measuring 4.1 x 6.7 cm. Previously 4 cm maximal dimension. Suggest further characterization with ultrasound. Uterus is not enlarged. No free or loculated pelvic fluid collections. No pelvic lymphadenopathy. No destructive bone lesions. IMPRESSION: No renal or ureteral stone or obstruction. Enlarging complex mass in the left adnexae. Ultrasound suggested for further characterization. Electronically Signed   By: Burman Nieves M.D.   On: 05/06/2016 03:34    Assessment & Plan:  1. Cyst of left ovary Talked about ovulation suppression. She has used regular dose OCPs and Depo Provera in past, did not like side effects. Also smokes 1 ppd of cigarettes. Discussed increased risk of VTE.  Will do trial of low dose OCPs for now. May also help with pelvic pain. Naproxen prescribed. No need for surgical intervention at this point. Follow up scan scheduled. - Norethindrone Acetate-Ethinyl Estrad-FE (LOESTRIN 24 FE) 1-20 MG-MCG(24) tablet; Take 1 tablet by mouth daily.  Dispense: 1 Package; Refill: 11 - naproxen (NAPROSYN) 500 MG tablet; Take 1 tablet (500 mg total) by mouth 2 (two) times daily with a meal. As needed for pain  Dispense: 60 tablet; Refill: 2 - US Transvaginal Non-OB; Future Routine preventative health maintenance measures emphasized. Please refer to After Visit Summary for other counseling recommendations.   Return in about 2 months (around 07/21/2016) for Follow up ovarian cyst and annual pap smear.   Total face-to-face time with patient: 20 minutes. Over 50% of encounter was spent on counseling and coordination of care.   Jaynie Collins, MD, FACOG Attending Obstetrician & Gynecologist, [facility]

## 2016-05-21 NOTE — Patient Instructions (Signed)
Thank you for enrolling in MyChart. Please follow the instructions below to securely access your online medical record. MyChart allows you to send messages to your doctor, view your test results, manage appointments, and more.   How Do I Sign Up? 1. In your Internet browser, go to [employer] and enter [email or web address]. 2. Click on the Sign Up Now link in the Sign In box. You will see the New Member Sign Up page. 3. Enter your MyChart Access Code exactly as it appears below. You will not need to use this code after you've completed the sign-up process. If you do not sign up before the expiration date, you must request a new code.  MyChart Access Code: [email or web address] Expires: 07/05/2016  5:48 AM  4. Enter your Social Security Number ([email or web address]) and Date of Birth (mm/dd/yyyy) as indicated and click Submit. You will be taken to the next sign-up page. 5. Create a MyChart ID. This will be your MyChart login ID and cannot be changed, so think of one that is secure and easy to remember. 6. Create a MyChart password. You can change your password at any time. 7. Enter your Password Reset Question and Answer. This can be used at a later time if you forget your password.  8. Enter your e-mail address. You will receive e-mail notification when new information is available in MyChart. 9. Click Sign Up. You can now view your medical record.   Additional Information Remember, MyChart is NOT to be used for urgent needs. For medical emergencies, dial 911.  Ovarian Cyst An ovarian cyst is a fluid-filled sac that forms on an ovary. The ovaries are small organs that produce eggs in women. Various types of cysts can form on the ovaries. Most are not cancerous. Many do not cause problems, and they often go away on their own. Some may cause symptoms and require treatment. Common types of ovarian cysts include:  Functional cysts--These cysts may occur every month during the menstrual cycle. This  is normal. The cysts usually go away with the next menstrual cycle if the woman does not get pregnant. Usually, there are no symptoms with a functional cyst, but sometimes can experience pain.  Endometrioma cysts--These cysts form from the tissue that lines the uterus. They are also called "chocolate cysts" because they become filled with blood that turns brown. This type of cyst can cause pain in the lower abdomen during intercourse and with your menstrual period.  Cystadenoma cysts--This type develops from the cells on the outside of the ovary. These cysts can get very big and cause lower abdomen pain and pain with intercourse. This type of cyst can twist on itself, cut off its blood supply, and cause severe pain. It can also easily rupture and cause a lot of pain.  Dermoid cysts--This type of cyst is sometimes found in both ovaries. These cysts may contain different kinds of body tissue, such as skin, teeth, hair, or cartilage. They usually do not cause symptoms unless they get very big.  Theca lutein cysts--These cysts occur when too much of a certain hormone (human chorionic gonadotropin) is produced and overstimulates the ovaries to produce an egg. This is most common after procedures used to assist with the conception of a baby (in vitro fertilization). CAUSES   Fertility drugs can cause a condition in which multiple large cysts are formed on the ovaries. This is called ovarian hyperstimulation syndrome.  A condition called polycystic ovary syndrome can cause hormonal imbalances that can lead to  nonfunctional ovarian cysts. SIGNS AND SYMPTOMS  Many ovarian cysts do not cause symptoms. If symptoms are present, they may include:  Pelvic pain or pressure.  Pain in the lower abdomen.  Pain during sexual intercourse.  Increasing girth (swelling) of the abdomen.  Abnormal menstrual periods.  Increasing pain with menstrual periods.  Stopping having menstrual periods without being  pregnant. DIAGNOSIS  These cysts are commonly found during a routine or annual pelvic exam. Tests may be ordered to find out more about the cyst. These tests may include:  Ultrasound.  X-ray of the pelvis.  CT scan.  MRI.  Blood tests. TREATMENT  Many ovarian cysts go away on their own without treatment. Your health care provider may want to check your cyst regularly for 2-3 months to see if it changes. For women in menopause, it is particularly important to monitor a cyst closely because of the higher rate of ovarian cancer in menopausal women. When treatment is needed, it may include any of the following:  A procedure to drain the cyst (aspiration). This may be done using a long needle and ultrasound. It can also be done through a laparoscopic procedure. This involves using a thin, lighted tube with a tiny camera on the end (laparoscope) inserted through a small incision.  Surgery to remove the whole cyst. This may be done using laparoscopic surgery or an open surgery involving a larger incision in the lower abdomen.  Hormone treatment or birth control pills. These methods are sometimes used to help dissolve a cyst. HOME CARE INSTRUCTIONS   Only take over-the-counter or prescription medicines as directed by your health care provider.  Follow up with your health care provider as directed.  Get regular pelvic exams and Pap tests. SEEK MEDICAL CARE IF:   Your periods are late, irregular, or painful, or they stop.  Your pelvic pain or abdominal pain does not go away.  Your abdomen becomes larger or swollen.  You have pressure on your bladder or trouble emptying your bladder completely.  You have pain during sexual intercourse.  You have feelings of fullness, pressure, or discomfort in your stomach.  You lose weight for no apparent reason.  You feel generally ill.  You become constipated.  You lose your appetite.  You develop acne.  You have an increase in body and  facial hair.  You are gaining weight, without changing your exercise and eating habits.  You think you are pregnant. SEEK IMMEDIATE MEDICAL CARE IF:   You have increasing abdominal pain.  You feel sick to your stomach (nauseous), and you throw up (vomit).  You develop a fever that comes on suddenly.  You have abdominal pain during a bowel movement.  Your menstrual periods become heavier than usual. MAKE SURE YOU:  Understand these instructions.  Will watch your condition.  Will get help right away if you are not doing well or get worse.   This information is not intended to replace advice given to you by your health care provider. Make sure you discuss any questions you have with your health care provider.   Document Released: 12/17/2005 Document Revised: 12/22/2013 Document Reviewed: 08/24/2013 Elsevier Interactive Patient Education [employer]

## 2016-05-27 ENCOUNTER — Encounter (HOSPITAL_COMMUNITY): Payer: Self-pay | Admitting: Emergency Medicine

## 2016-05-27 DIAGNOSIS — Y92009 Unspecified place in unspecified non-institutional (private) residence as the place of occurrence of the external cause: Secondary | ICD-10-CM | POA: Insufficient documentation

## 2016-05-27 DIAGNOSIS — S56522A Laceration of other extensor muscle, fascia and tendon at forearm level, left arm, initial encounter: Secondary | ICD-10-CM | POA: Insufficient documentation

## 2016-05-27 DIAGNOSIS — Y9389 Activity, other specified: Secondary | ICD-10-CM | POA: Insufficient documentation

## 2016-05-27 DIAGNOSIS — Z8742 Personal history of other diseases of the female genital tract: Secondary | ICD-10-CM | POA: Insufficient documentation

## 2016-05-27 DIAGNOSIS — F4310 Post-traumatic stress disorder, unspecified: Secondary | ICD-10-CM | POA: Insufficient documentation

## 2016-05-27 DIAGNOSIS — Q513 Bicornate uterus: Secondary | ICD-10-CM | POA: Diagnosis not present

## 2016-05-27 DIAGNOSIS — S59912A Unspecified injury of left forearm, initial encounter: Secondary | ICD-10-CM | POA: Diagnosis present

## 2016-05-27 DIAGNOSIS — W268XXA Contact with other sharp object(s), not elsewhere classified, initial encounter: Secondary | ICD-10-CM | POA: Diagnosis not present

## 2016-05-27 DIAGNOSIS — F17210 Nicotine dependence, cigarettes, uncomplicated: Secondary | ICD-10-CM | POA: Insufficient documentation

## 2016-05-27 DIAGNOSIS — F209 Schizophrenia, unspecified: Secondary | ICD-10-CM | POA: Diagnosis not present

## 2016-05-27 DIAGNOSIS — Z79899 Other long term (current) drug therapy: Secondary | ICD-10-CM | POA: Insufficient documentation

## 2016-05-27 DIAGNOSIS — Z793 Long term (current) use of hormonal contraceptives: Secondary | ICD-10-CM | POA: Insufficient documentation

## 2016-05-27 DIAGNOSIS — F329 Major depressive disorder, single episode, unspecified: Secondary | ICD-10-CM | POA: Insufficient documentation

## 2016-05-27 DIAGNOSIS — Y998 Other external cause status: Secondary | ICD-10-CM | POA: Insufficient documentation

## 2016-05-27 DIAGNOSIS — F419 Anxiety disorder, unspecified: Secondary | ICD-10-CM | POA: Insufficient documentation

## 2016-05-27 NOTE — ED Notes (Signed)
Pt in EMS from home, full thickness lac to L ant FA. Bleeding controlled. Received 200 fentanyl, 4 zofran IV en route. VSS

## 2016-05-28 ENCOUNTER — Emergency Department (HOSPITAL_COMMUNITY)
Admission: EM | Admit: 2016-05-28 | Discharge: 2016-05-28 | Disposition: A | Discharge status: Home / Self Care | Payer: Medicaid Other | Attending: Emergency Medicine | Admitting: Emergency Medicine

## 2016-05-28 DIAGNOSIS — S51802A Unspecified open wound of left forearm, initial encounter: Secondary | ICD-10-CM

## 2016-05-28 DIAGNOSIS — S56522A Laceration of other extensor muscle, fascia and tendon at forearm level, left arm, initial encounter: Secondary | ICD-10-CM

## 2016-05-28 DIAGNOSIS — S56922A Laceration of unspecified muscles, fascia and tendons at forearm level, left arm, initial encounter: Secondary | ICD-10-CM

## 2016-05-28 MED ORDER — OXYCODONE-ACETAMINOPHEN 5-325 MG PO TABS
1.0000 | ORAL_TABLET | Freq: Once | ORAL | Status: AC
  Administered 2016-05-28: 1 via ORAL
  Filled 2016-05-28: qty 1

## 2016-05-28 MED ORDER — CEFAZOLIN SODIUM 1-5 GM-% IV SOLN
1.0000 g | Freq: Once | INTRAVENOUS | Status: AC
  Administered 2016-05-28: 1 g via INTRAVENOUS
  Filled 2016-05-28: qty 50

## 2016-05-28 MED ORDER — OXYCODONE-ACETAMINOPHEN 5-325 MG PO TABS: 1.0000 | ORAL_TABLET | ORAL | Status: DC | PRN

## 2016-05-28 MED ORDER — LIDOCAINE HCL 2 % IJ SOLN
10.0000 mL | Freq: Once | INTRAMUSCULAR | Status: AC
  Administered 2016-05-28: 200 mg via INTRADERMAL
  Filled 2016-05-28: qty 20

## 2016-05-28 MED ORDER — HYDROMORPHONE HCL 1 MG/ML IJ SOLN
1.0000 mg | Freq: Once | INTRAMUSCULAR | Status: AC
  Administered 2016-05-28: 1 mg via INTRAVENOUS
  Filled 2016-05-28: qty 1

## 2016-05-28 MED ORDER — CEPHALEXIN 500 MG PO CAPS: 500.0000 mg | ORAL_CAPSULE | Freq: Four times a day (QID) | ORAL | Status: DC

## 2016-05-28 MED ORDER — HYDROMORPHONE HCL 1 MG/ML IJ SOLN
1.0000 mg | Freq: Once | INTRAMUSCULAR | Status: AC
  Administered 2016-05-28: 1 mg via INTRAVENOUS

## 2016-05-28 NOTE — ED Provider Notes (Signed)
Patient presented to the ER with laceration to arm.  Face to face Exam: HEENT - PERRLA Lungs - CTAB Heart - RRR, no M/R/G Abd - S/NT/ND Musculoskeletal - deep laceration of her proximal forearm on the volar aspect. Lacerated muscle bodies and tendons visualized within the wound. No foreign body  Plan: Loose approximation, antibiotics, follow-up with hand surgery for surgical repair.  Gilda Crease, MD 05/28/16 [phone]

## 2016-05-28 NOTE — Discharge Instructions (Signed)
Take Keflex as prescribed prophylactically for infection prevention, take pain medications as prescribed as needed. Please go to Dr. Carollee Massed office at 8 AM on Tuesday, make sure not to eat anything past midnight   Tendon Injury Tendons are strong, cordlike structures that connect muscle to bone. Tendons are made up of woven fibers, like a rope. A tendon injury is a tear (rupture) of the tendon. The rupture may be partial (only a few of the fibers in your tendon rupture) or complete (your entire tendon ruptures). CAUSES  Tendon injuries can be caused by high-stress activities, such as sports. They also can be caused by a repetitive injury or by a single injury from an excessive, rapid force. SYMPTOMS  Symptoms of tendon injury include pain when you move the joint close to the tendon. Other symptoms are swelling, redness, and warmth. DIAGNOSIS  Tendon injuries often can be diagnosed by physical exam. However, sometimes an X-ray exam or advanced imaging, such as magnetic resonance imaging (MRI), is necessary to determine the extent of the injury. TREATMENT  Partial tendon ruptures often can be treated with immobilization. A splint, bandage, or removable brace usually is used to immobilize the injured tendon. Most injured tendons need to be immobilized for 1-2 months before they are completely healed. Complete tendon ruptures may require surgical reattachment.   This information is not intended to replace advice given to you by your health care provider. Make sure you discuss any questions you have with your health care provider.   Document Released: 01/24/2005 Document Revised: 12/06/2011 Document Reviewed: 03/09/2012 Elsevier Interactive Patient Education [employer]

## 2016-05-28 NOTE — ED Notes (Signed)
Ortho paged for splint 

## 2016-05-28 NOTE — ED Provider Notes (Signed)
CSN: [id number]     Arrival date & time 05/27/16  2330 History   First MD Initiated Contact with Patient 05/28/16 0207     Chief Complaint  Patient presents with  . Laceration     (Consider location/radiation/quality/duration/timing/severity/associated sxs/prior Treatment) HPI [name] is a 30 y.o. female presents to emergency department with left forearm laceration. Patient states that she was trying to lower the window, states it was stuck, she was hammering on the window and it fell down cutting her forearm. Patient reports her tetanus is up-to-date. Bleeding was controlled with pressure. Patient received 200 g of fentanyl and 4 mg of Zofran by EMS. Patient is able to move her fingers but is difficult to move them. Denies numbness to the hand. Denies any other injuries. Denies suicidal or homicidal ideations or self injury.  Past Medical History  Diagnosis Date  . Depression   . Anxiety   . History of ovarian cyst   . Bartholin cyst LEFT SIDE  . Bicornuate uterus S/P EXCISION RIGHT UTERUS HORN 2007  . GERD (gastroesophageal reflux disease)     diet controlled - no meds  . Headache     sleep, xanax  . PTSD (post-traumatic stress disorder)   . Schizophrenia [facility])    Past Surgical History  Procedure Laterality Date  . Laparoscopic appendectomy and right ovarian cystectomy  09-04-2004  . Laparoscopic excision hypoplastic right uterine horn and right salpingoophectomy  03-18-2006  . Bartholin cyst marsupialization  07/18/2012    Procedure: BARTHOLIN CYST MARSUPIALIZATION;  Surgeon: Ok Edwards, MD;  Location: [facility];  Service: Gynecology;  Laterality: Left;  Left Bartholin Duct Marsupilization. One hour.   . Diagnostic laparoscopy    . Appendectomy    . Wisdom tooth extraction    . Bartholin gland cyst excision Left 02/11/2015    Procedure: BARTHOLIN GLAND EXCISION;  Surgeon: Mitchel Honour, DO;  Location: [facility];  Service: Gynecology;  Laterality: Left;     Family History  Problem Relation Age of Onset  . Diabetes Mother   . Hyperlipidemia Mother   . Bipolar disorder Mother   . Depression Mother   . Hypertension Maternal Grandmother   . Heart disease Maternal Grandmother 60  . Diabetes Maternal Grandfather   . Hypertension Maternal Grandfather   . Hyperlipidemia Maternal Grandfather   . Alcohol abuse Maternal Grandfather   . Breast cancer Paternal Grandmother   . Cancer Paternal Grandmother     breast  . Alcohol abuse Father   . Heart disease Father     not sure what it is  . Drug abuse Maternal Aunt   . Schizophrenia Cousin   . Cancer - Colon Paternal Grandfather    Social History  Substance Use Topics  . Smoking status: Current Every Day Smoker -- 0.50 packs/day for 12 years    Types: Cigarettes  . Smokeless tobacco: Never Used  . Alcohol Use: Yes     Comment: seldom   OB History    Gravida Para Term Preterm AB TAB SAB Ectopic Multiple Living   0              Review of Systems  Musculoskeletal: Positive for myalgias and arthralgias.  Skin: Positive for wound.  Neurological: Positive for weakness. Negative for numbness.  All other systems reviewed and are negative.     Allergies  Hydrocodone-acetaminophen  Home Medications   Prior to Admission medications   Medication Sig Start Date End Date Taking? Authorizing Provider  ALPRAZolam (XANAX) 1 MG tablet Take 1 mg by mouth 2 (two) times daily as needed for anxiety. Reported on 05/21/2016    Historical Provider, MD  BuPROPion HCl (WELLBUTRIN PO) Take by mouth.    Historical Provider, MD  citalopram (CELEXA) 20 MG tablet Take 30 mg by mouth daily.    Historical Provider, MD  Eszopiclone 3 MG TABS Take 3 mg by mouth at bedtime. Take immediately before bedtime    Historical Provider, MD  fluticasone (FLOVENT HFA) 110 MCG/ACT inhaler Inhale 1 puff into the lungs 2 (two) times daily. Patient not taking: Reported on 05/21/2016 10/27/15   Beatrice Lecher, PA-C  ibuprofen  (ADVIL,MOTRIN) 800 MG tablet Take 800 mg by mouth every 8 (eight) hours as needed for headache or moderate pain.    Historical Provider, MD  Loratadine (CLARITIN) 10 MG CAPS Take 10 mg by mouth daily.    Historical Provider, MD  naproxen (NAPROSYN) 500 MG tablet Take 1 tablet (500 mg total) by mouth 2 (two) times daily with a meal. As needed for pain 05/21/16   Tereso Newcomer, MD  Norethindrone Acetate-Ethinyl Estrad-FE (LOESTRIN 24 FE) 1-20 MG-MCG(24) tablet Take 1 tablet by mouth daily. 05/21/16   Tereso Newcomer, MD  OLANZapine (ZYPREXA) 5 MG tablet Take 5 mg by mouth at bedtime.    Historical Provider, MD  ondansetron (ZOFRAN ODT) 4 MG disintegrating tablet 4mg  ODT q4 hours prn nausea/vomit Patient not taking: Reported on 05/21/2016 05/06/16   Dahlia Client Muthersbaugh, PA-C  oxyCODONE-acetaminophen (PERCOCET) 5-325 MG tablet Take 1-2 tablets by mouth every 4 (four) hours as needed. Patient not taking: Reported on 05/21/2016 05/06/16   Dahlia Client Muthersbaugh, PA-C  traZODone (DESYREL) 100 MG tablet Take 200 mg by mouth at bedtime.     Historical Provider, MD   BP 115/74 mmHg  Pulse 96  Temp(Src) 98.3 F (36.8 C) (Oral)  Resp 18  Ht 5\' 4"  (1.626 m)  Wt 72.576 kg  BMI 27.45 kg/m2  SpO2 100%  LMP 05/19/2016 Physical Exam  Constitutional: She appears well-developed and well-nourished. No distress.  Eyes: Conjunctivae are normal.  Neck: Neck supple.  Musculoskeletal:       Arms: The deep perpendicular 8cm laceration to the proximal dorsal forearm with muscle and tendon exposed. Weakness with dorsiflexion of the wrist and extension of fingers 2-4. Sensation is intact except for small area of forearm just distal to the laceration Cap refill less than 2 seconds distally. Distal radial pulse intact.  Neurological: She is alert.  Skin: Skin is warm and dry.  Nursing note and vitals reviewed.   ED Course  Procedures (including critical care time) Labs Review Labs Reviewed - No data to  display  Imaging Review No results found. I have personally reviewed and evaluated these images and lab results as part of my medical decision-making.   EKG Interpretation None       LACERATION REPAIR Performed by: Lottie Mussel Authorized by: Jaynie Crumble A Consent: Verbal consent obtained. Risks and benefits: risks, benefits and alternatives were discussed Consent given by: patient Patient identity confirmed: provided demographic data Prepped and Draped in normal sterile fashion Wound explored  Laceration Location: left forearm  Laceration Length: 8cm  No Foreign Bodies seen or palpated  Anesthesia: local infiltration  Local anesthetic: lidocaine 2% wo epinephrine  Anesthetic total: 7 ml  Irrigation method: syringe Amount of cleaning: extensive  Skin closure: prolene 3.0  Number of sutures: 3  Technique: simple interrupted  Patient tolerance: Patient tolerated  the procedure well with no immediate complications.   MDM   Final diagnoses:  Extensor tendon laceration of left forearm with open wound, initial encounter   Patient with deep laceration involving muscle and tendons. She is neurovascularly intact, but having some difficulty with extension of fingers 2 through 4 and wrist. There is also area of numbness just distal to the laceration. I discussed patient's laceration with Dr. Janee Morn with hand surgery. Recommended washout at bedside, loose approximation with 3 stitches, splint, home with antibiotics, follow-up with him Tuesday morning for surgery. Discussed plan with patient who agreed.  I washed out laceration with 2 L of saline. I closed with 3 stitches loosely. Volar splint applied. Patient received 1 g of Ancef in emergency department and will discharge home with Keflex. She will report to Dr. Carollee Massed office at 8 AM on Tuesday nothing by mouth. Her tetanus is up-to-date.  Filed Vitals:   05/27/16 2336 05/28/16 0557  BP: 115/74  126/60  Pulse: 96 75  Temp: 98.3 F (36.8 C)   TempSrc: Oral   Resp: 18 18  Height:  (1.626 m)   Weight: 72.576 kg   SpO2: 100% 99%     Jaynie Crumble, PA-C 05/28/16 9604  Gilda Crease, MD 05/28/16 2308

## 2016-05-29 ENCOUNTER — Ambulatory Visit (HOSPITAL_BASED_OUTPATIENT_CLINIC_OR_DEPARTMENT_OTHER)
Admission: RE | Admit: 2016-05-29 | Discharge: 2016-05-29 | Disposition: A | Discharge status: Home / Self Care | Payer: Medicaid Other | Source: Ambulatory Visit | Attending: Orthopedic Surgery | Admitting: Orthopedic Surgery

## 2016-05-29 ENCOUNTER — Encounter (HOSPITAL_BASED_OUTPATIENT_CLINIC_OR_DEPARTMENT_OTHER)
Admission: RE | Disposition: A | Discharge status: Home / Self Care | Payer: Self-pay | Source: Ambulatory Visit | Attending: Orthopedic Surgery

## 2016-05-29 ENCOUNTER — Ambulatory Visit (HOSPITAL_BASED_OUTPATIENT_CLINIC_OR_DEPARTMENT_OTHER): Payer: Medicaid Other | Admitting: Anesthesiology

## 2016-05-29 ENCOUNTER — Encounter (HOSPITAL_BASED_OUTPATIENT_CLINIC_OR_DEPARTMENT_OTHER): Payer: Self-pay | Admitting: Anesthesiology

## 2016-05-29 DIAGNOSIS — F329 Major depressive disorder, single episode, unspecified: Secondary | ICD-10-CM | POA: Insufficient documentation

## 2016-05-29 DIAGNOSIS — F419 Anxiety disorder, unspecified: Secondary | ICD-10-CM | POA: Diagnosis not present

## 2016-05-29 DIAGNOSIS — Z885 Allergy status to narcotic agent status: Secondary | ICD-10-CM | POA: Insufficient documentation

## 2016-05-29 DIAGNOSIS — F209 Schizophrenia, unspecified: Secondary | ICD-10-CM | POA: Diagnosis not present

## 2016-05-29 DIAGNOSIS — F4310 Post-traumatic stress disorder, unspecified: Secondary | ICD-10-CM | POA: Insufficient documentation

## 2016-05-29 DIAGNOSIS — S51812A Laceration without foreign body of left forearm, initial encounter: Secondary | ICD-10-CM | POA: Insufficient documentation

## 2016-05-29 DIAGNOSIS — F17210 Nicotine dependence, cigarettes, uncomplicated: Secondary | ICD-10-CM | POA: Insufficient documentation

## 2016-05-29 DIAGNOSIS — S56522A Laceration of other extensor muscle, fascia and tendon at forearm level, left arm, initial encounter: Secondary | ICD-10-CM | POA: Diagnosis not present

## 2016-05-29 DIAGNOSIS — K219 Gastro-esophageal reflux disease without esophagitis: Secondary | ICD-10-CM | POA: Insufficient documentation

## 2016-05-29 HISTORY — PX: LACERATION REPAIR: SHX5284

## 2016-05-29 HISTORY — PX: TENDON REPAIR: SHX5111

## 2016-05-29 SURGERY — REPAIR, LACERATION, 2 OR MORE
Anesthesia: General | Site: Arm Lower | Laterality: Left

## 2016-05-29 MED ORDER — FAMOTIDINE IN NACL 20-0.9 MG/50ML-% IV SOLN
20.0000 mg | Freq: Once | INTRAVENOUS | Status: AC
  Administered 2016-05-29: 20 mg via INTRAVENOUS

## 2016-05-29 MED ORDER — ONDANSETRON HCL 4 MG/2ML IJ SOLN
INTRAMUSCULAR | Status: DC | PRN
  Administered 2016-05-29: 4 mg via INTRAVENOUS

## 2016-05-29 MED ORDER — KETOROLAC TROMETHAMINE 30 MG/ML IJ SOLN: 30.0000 mg | Freq: Once | INTRAMUSCULAR | Status: DC | PRN

## 2016-05-29 MED ORDER — OXYCODONE HCL 5 MG PO TABS
ORAL_TABLET | ORAL | Status: AC
  Filled 2016-05-29: qty 1

## 2016-05-29 MED ORDER — OXYCODONE HCL 5 MG PO TABS
5.0000 mg | ORAL_TABLET | Freq: Once | ORAL | Status: AC
  Administered 2016-05-29: 5 mg via ORAL

## 2016-05-29 MED ORDER — DEXAMETHASONE SODIUM PHOSPHATE 10 MG/ML IJ SOLN
INTRAMUSCULAR | Status: AC
  Filled 2016-05-29: qty 1

## 2016-05-29 MED ORDER — PROPOFOL 10 MG/ML IV BOLUS
INTRAVENOUS | Status: DC | PRN
  Administered 2016-05-29: 100 mg via INTRAVENOUS
  Administered 2016-05-29: 200 mg via INTRAVENOUS

## 2016-05-29 MED ORDER — BUPIVACAINE-EPINEPHRINE (PF) 0.5% -1:200000 IJ SOLN
INTRAMUSCULAR | Status: AC
  Filled 2016-05-29: qty 30

## 2016-05-29 MED ORDER — PROPOFOL 10 MG/ML IV BOLUS
INTRAVENOUS | Status: AC
  Filled 2016-05-29: qty 20

## 2016-05-29 MED ORDER — FENTANYL CITRATE (PF) 100 MCG/2ML IJ SOLN
25.0000 ug | INTRAMUSCULAR | Status: DC | PRN
  Administered 2016-05-29 (×2): 50 ug via INTRAVENOUS

## 2016-05-29 MED ORDER — FENTANYL CITRATE (PF) 100 MCG/2ML IJ SOLN
INTRAMUSCULAR | Status: DC | PRN
  Administered 2016-05-29: 100 ug via INTRAVENOUS

## 2016-05-29 MED ORDER — CEFAZOLIN SODIUM-DEXTROSE 2-4 GM/100ML-% IV SOLN
INTRAVENOUS | Status: AC
  Filled 2016-05-29: qty 100

## 2016-05-29 MED ORDER — CEFAZOLIN SODIUM-DEXTROSE 2-4 GM/100ML-% IV SOLN
2.0000 g | INTRAVENOUS | Status: AC
  Administered 2016-05-29 (×2): 2 g via INTRAVENOUS

## 2016-05-29 MED ORDER — LIDOCAINE 2% (20 MG/ML) 5 ML SYRINGE
INTRAMUSCULAR | Status: AC
  Filled 2016-05-29: qty 5

## 2016-05-29 MED ORDER — FENTANYL CITRATE (PF) 100 MCG/2ML IJ SOLN
INTRAMUSCULAR | Status: AC
  Filled 2016-05-29: qty 2

## 2016-05-29 MED ORDER — LIDOCAINE HCL (CARDIAC) 20 MG/ML IV SOLN
INTRAVENOUS | Status: DC | PRN
  Administered 2016-05-29: 30 mg via INTRAVENOUS

## 2016-05-29 MED ORDER — LACTATED RINGERS IV SOLN
INTRAVENOUS | Status: DC | PRN
  Administered 2016-05-29 (×2): via INTRAVENOUS

## 2016-05-29 MED ORDER — ONDANSETRON HCL 4 MG/2ML IJ SOLN
INTRAMUSCULAR | Status: AC
  Filled 2016-05-29: qty 2

## 2016-05-29 MED ORDER — PROMETHAZINE HCL 25 MG/ML IJ SOLN: 6.2500 mg | INTRAMUSCULAR | Status: DC | PRN

## 2016-05-29 MED ORDER — DEXAMETHASONE SODIUM PHOSPHATE 4 MG/ML IJ SOLN
INTRAMUSCULAR | Status: DC | PRN
  Administered 2016-05-29: 10 mg via INTRAVENOUS

## 2016-05-29 MED ORDER — BUPIVACAINE-EPINEPHRINE 0.5% -1:200000 IJ SOLN
INTRAMUSCULAR | Status: DC | PRN
  Administered 2016-05-29: 10 mL

## 2016-05-29 SURGICAL SUPPLY — 60 items
BANDAGE COBAN STERILE 2 (GAUZE/BANDAGES/DRESSINGS) IMPLANT
BENZOIN TINCTURE PRP APPL 2/3 (GAUZE/BANDAGES/DRESSINGS) ×2 IMPLANT
BLADE MINI RND TIP GREEN BEAV (BLADE) IMPLANT
BLADE SURG 15 STRL LF DISP TIS (BLADE) ×1 IMPLANT
BLADE SURG 15 STRL SS (BLADE) ×1
BNDG COHESIVE 4X5 TAN STRL (GAUZE/BANDAGES/DRESSINGS) ×2 IMPLANT
BNDG ESMARK 4X9 LF (GAUZE/BANDAGES/DRESSINGS) IMPLANT
BNDG GAUZE 1X2.1 STRL (MISCELLANEOUS) IMPLANT
BNDG GAUZE ELAST 4 BULKY (GAUZE/BANDAGES/DRESSINGS) ×2 IMPLANT
BRUSH SCRUB DISP (MISCELLANEOUS) ×4 IMPLANT
CHLORAPREP W/TINT 26ML (MISCELLANEOUS) IMPLANT
CLSR STERI-STRIP ANTIMIC 1/2X4 (GAUZE/BANDAGES/DRESSINGS) ×2 IMPLANT
CORDS BIPOLAR (ELECTRODE) IMPLANT
COVER BACK TABLE 60X90IN (DRAPES) ×2 IMPLANT
COVER MAYO STAND STRL (DRAPES) ×2 IMPLANT
CUFF TOURNIQUET SINGLE 18IN (TOURNIQUET CUFF) ×2 IMPLANT
DRAIN PENROSE 1/2X12 LTX STRL (WOUND CARE) IMPLANT
DRAPE EXTREMITY T 121X128X90 (DRAPE) ×2 IMPLANT
DRAPE SURG 17X23 STRL (DRAPES) ×2 IMPLANT
DRSG EMULSION OIL 3X3 NADH (GAUZE/BANDAGES/DRESSINGS) ×2 IMPLANT
ELECT REM PT RETURN 9FT ADLT (ELECTROSURGICAL) ×2
ELECTRODE REM PT RTRN 9FT ADLT (ELECTROSURGICAL) ×1 IMPLANT
GLOVE BIO SURGEON STRL SZ 6.5 (GLOVE) ×2 IMPLANT
GLOVE BIO SURGEON STRL SZ7.5 (GLOVE) ×2 IMPLANT
GLOVE BIOGEL PI IND STRL 7.0 (GLOVE) ×3 IMPLANT
GLOVE BIOGEL PI IND STRL 8 (GLOVE) ×1 IMPLANT
GLOVE BIOGEL PI IND STRL 8.5 (GLOVE) ×1 IMPLANT
GLOVE BIOGEL PI INDICATOR 7.0 (GLOVE) ×3
GLOVE BIOGEL PI INDICATOR 8 (GLOVE) ×1
GLOVE BIOGEL PI INDICATOR 8.5 (GLOVE) ×1
GLOVE ECLIPSE 6.5 STRL STRAW (GLOVE) ×2 IMPLANT
GLOVE SURG SS PI 8.0 STRL IVOR (GLOVE) ×2 IMPLANT
GOWN STRL REUS W/ TWL LRG LVL3 (GOWN DISPOSABLE) ×2 IMPLANT
GOWN STRL REUS W/TWL LRG LVL3 (GOWN DISPOSABLE) ×2
GOWN STRL REUS W/TWL XL LVL3 (GOWN DISPOSABLE) ×2 IMPLANT
NEEDLE HYPO 25X1 1.5 SAFETY (NEEDLE) ×2 IMPLANT
NS IRRIG 1000ML POUR BTL (IV SOLUTION) ×2 IMPLANT
PACK BASIN DAY SURGERY FS (CUSTOM PROCEDURE TRAY) ×2 IMPLANT
PADDING CAST ABS 4INX4YD NS (CAST SUPPLIES)
PADDING CAST ABS COTTON 4X4 ST (CAST SUPPLIES) IMPLANT
PENCIL BUTTON HOLSTER BLD 10FT (ELECTRODE) ×2 IMPLANT
RUBBERBAND STERILE (MISCELLANEOUS) IMPLANT
SLEEVE SCD COMPRESS KNEE MED (MISCELLANEOUS) ×2 IMPLANT
SLING ARM FOAM STRAP MED (SOFTGOODS) ×2 IMPLANT
SPLINT PLASTER CAST XFAST 3X15 (CAST SUPPLIES) ×10 IMPLANT
SPLINT PLASTER XTRA FASTSET 3X (CAST SUPPLIES) ×10
SPONGE GAUZE 4X4 12PLY STER LF (GAUZE/BANDAGES/DRESSINGS) ×4 IMPLANT
STOCKINETTE 6  STRL (DRAPES) ×1
STOCKINETTE 6 STRL (DRAPES) ×1 IMPLANT
SUT ETHIBOND 2-0 V-5 NEEDLE (SUTURE) ×2 IMPLANT
SUT VIC AB 2-0 SH 27 (SUTURE) ×1
SUT VIC AB 2-0 SH 27XBRD (SUTURE) ×1 IMPLANT
SUT VICRYL RAPIDE 4-0 (SUTURE) IMPLANT
SUT VICRYL RAPIDE 4/0 PS 2 (SUTURE) ×2 IMPLANT
SYR BULB 3OZ (MISCELLANEOUS) ×2 IMPLANT
SYRINGE 10CC LL (SYRINGE) ×2 IMPLANT
TOWEL OR 17X24 6PK STRL BLUE (TOWEL DISPOSABLE) ×2 IMPLANT
TOWEL OR NON WOVEN STRL DISP B (DISPOSABLE) ×2 IMPLANT
TRAY DSU PREP LF (CUSTOM PROCEDURE TRAY) ×2 IMPLANT
UNDERPAD 30X30 (UNDERPADS AND DIAPERS) ×2 IMPLANT

## 2016-05-29 NOTE — Transfer of Care (Signed)
Immediate Anesthesia Transfer of Care Note  Patient: [name] A Cephas  Procedure(s) Performed: Procedure(s) with comments: REPAIR MULTIPLE LACERATIONS (Left) - laceration left forearm  TENDON REPAIR (Left)  Patient Location: PACU  Anesthesia Type:General  Level of Consciousness: awake and patient cooperative  Airway & Oxygen Therapy: Patient Spontanous Breathing and Patient connected to face mask oxygen  Post-op Assessment: Report given to RN and Post -op Vital signs reviewed and stable  Post vital signs: Reviewed and stable  Last Vitals:  Filed Vitals:   05/29/16 1136  BP: 119/66  Pulse: 83  Temp: 36.9 C  Resp: 16    Last Pain:  Filed Vitals:   05/29/16 1140  PainSc: 6       Patients Stated Pain Goal: 3 (05/29/16 1136)  Complications: No apparent anesthesia complications

## 2016-05-29 NOTE — Anesthesia Postprocedure Evaluation (Signed)
Anesthesia Post Note  Patient: [name] A Barnaby  Procedure(s) Performed: Procedure(s) (LRB): REPAIR MULTIPLE LACERATIONS (Left) TENDON REPAIR (Left)  Patient location during evaluation: PACU Anesthesia Type: General Level of consciousness: awake and alert Pain management: pain level controlled Vital Signs Assessment: post-procedure vital signs reviewed and stable Respiratory status: spontaneous breathing, nonlabored ventilation, respiratory function stable and patient connected to nasal cannula oxygen Cardiovascular status: blood pressure returned to baseline and stable Postop Assessment: no signs of nausea or vomiting Anesthetic complications: no    Last Vitals:  Filed Vitals:   05/29/16 1349 05/29/16 1400  BP:  128/78  Pulse: 99 96  Temp:    Resp: 15 14    Last Pain:  Filed Vitals:   05/29/16 1402  PainSc: 6                  Olie Scaffidi S

## 2016-05-29 NOTE — Op Note (Signed)
05/29/2016  12:35 PM  PATIENT:  [name]  30 y.o. female  PRE-OPERATIVE DIAGNOSIS:  Left dorsal forearm lacerations, with suspected muscle/tendon injury  POST-OPERATIVE DIAGNOSIS:  Same  PROCEDURE:   1. Removal of sutures from left dorsal forearm laceration    2. Exploration and excisional debridement (S,SQ,Muscle) of left dorsal forearm wounds    3. Repair of extensor carpi radialis longus and extensor digitorum communis    4. Intermediate repair of left dorsal forearm laceration-8 cm in length    5.  Simple repair of left dorsal forearm laceration-4 cm  SURGEON: Cliffton Asters. Janee Morn, MD  PHYSICIAN ASSISTANT: Danielle Rankin, OPA-C  ANESTHESIA:  general  SPECIMENS:  None  DRAINS:   None  EBL:  less than 50 mL  PREOPERATIVE INDICATIONS:  [name] is a  30 y.o. female with dorsal left forearm lacerations and suspected injury to the [facility] muscle/tendons  The risks benefits and alternatives were discussed with the patient preoperatively including but not limited to the risks of infection, bleeding, nerve injury, cardiopulmonary complications, the need for revision surgery, among others, and the patient verbalized understanding and consented to proceed.  OPERATIVE IMPLANTS: none  OPERATIVE PROCEDURE:  After receiving prophylactic antibiotics, the patient was escorted to the operative theatre and placed in a supine position.  General anesthesia was administered.  A surgical "time-out" was performed during which the planned procedure, proposed operative site, and the correct patient identity were compared to the operative consent and agreement confirmed by the circulating nurse according to current facility policy.  Following application of a tourniquet to the operative extremity, the sutures were removed and the exposed skin was pre-scrubbed with a Hibiclens scrub brush before being formally prepped with Chloraprep and draped in the usual sterile fashion.  The limb was exsanguinated  with an Esmarch bandage and the tourniquet inflated to approximately higher than systolic BP.  The major wound was explored and the extent of injured structures ascertained.  Some nonviable muscle edges were debrided, as well as some subcutaneous tissue and even a couple areas of dermis.  The minor wound was also explored, found to be just to the dermis, and was debrided with scraping debridement.  Both wounds were copiously irrigated.    In the major wound, ECRL and EDC were repaired with 2-0 Ethibond suture in a grasping Mason-Allen variant suture.  There was some tendon still available to hold suture.  The muscle splits were reapproximated well, with the wrist and digits held extended.  The [facility] was largely to her long finger.  The fascia of the muscle was then reapproximated with a running 2-0 Vicryl suture.  The wounds were again irrigated, the tourniquet released some additional hemostasis obtained with Bovie electric cautery, and the skin was reapproximated with interrupted 4-0 Vicryl buried subcuticular sutures for the major wound, followed by benzoin and Steri-Strips, attempting to well approximate the body tattoo.  The smaller wound, outside of the tattoo, was closed with running 4-0 Vicryl Rapide subcuticular suture, benzoin and Steri-Strips.  Half percent Marcaine with epinephrine was instilled in the region of the skin incisions for postoperative pain control, and a short arm splint dressing was applied with a volar plaster component, placing the wrist into extension and supporting the MP joints in extension as well.  She was awakened and taken to the recovery room in stable condition, breathing spontaneously.  DISPOSITION: She'll be discharged home today, and we will contact her to set her up to see [facility]  for splint fabrication and initiation of rehabilitation.  Follow-up with [place] in 10-15 days for inspection of the splint and wounds.  Continue antibiotics until her supply is  depleted.

## 2016-05-29 NOTE — Discharge Instructions (Signed)

## 2016-05-29 NOTE — Anesthesia Procedure Notes (Addendum)
Procedure Name: LMA Insertion Date/Time: 05/29/2016 12:44 PM Performed by: Genevieve Norlander L Pre-anesthesia Checklist: Patient identified, Emergency Drugs available, Suction available, Patient being monitored and Timeout performed Patient Re-evaluated:Patient Re-evaluated prior to inductionOxygen Delivery Method: Circle System Utilized Preoxygenation: Pre-oxygenation with 100% oxygen Intubation Type: IV induction Ventilation: Mask ventilation without difficulty LMA: LMA inserted LMA Size: 4.0 Number of attempts: 1 Airway Equipment and Method: Bite block Placement Confirmation: positive ETCO2 Tube secured with: Tape Dental Injury: Teeth and Oropharynx as per pre-operative assessment

## 2016-05-29 NOTE — Anesthesia Preprocedure Evaluation (Signed)
Anesthesia Evaluation  Patient identified by MRN, date of birth, ID band Patient awake    Reviewed: Allergy & Precautions, NPO status , Patient's Chart, lab work & pertinent test results  Airway Mallampati: II  TM Distance: >3 FB Neck ROM: Full    Dental no notable dental hx.    Pulmonary Current Smoker,    Pulmonary exam normal breath sounds clear to auscultation       Cardiovascular negative cardio ROS Normal cardiovascular exam Rhythm:Regular Rate:Normal     Neuro/Psych Anxiety Schizophrenia negative neurological ROS     GI/Hepatic negative GI ROS, Neg liver ROS,   Endo/Other  negative endocrine ROS  Renal/GU negative Renal ROS  negative genitourinary   Musculoskeletal negative musculoskeletal ROS (+)   Abdominal   Peds negative pediatric ROS (+)  Hematology negative hematology ROS (+)   Anesthesia Other Findings   Reproductive/Obstetrics negative OB ROS                             Anesthesia Physical Anesthesia Plan  ASA: II  Anesthesia Plan: General   Post-op Pain Management:    Induction: Intravenous  Airway Management Planned: LMA  Additional Equipment:   Intra-op Plan:   Post-operative Plan: Extubation in OR  Informed Consent: I have reviewed the patients History and Physical, chart, labs and discussed the procedure including the risks, benefits and alternatives for the proposed anesthesia with the patient or authorized representative who has indicated his/her understanding and acceptance.   Dental advisory given  Plan Discussed with: CRNA and Surgeon  Anesthesia Plan Comments:         Anesthesia Quick Evaluation

## 2016-05-29 NOTE — H&P (Signed)
[name] is an 30 y.o. female.   CC / Reason for Visit: Left Forearm laceration HPI: This patient is a 30 year old, right-hand-dominant, female who indicates that she was trying to remove an old window when it broke and lacerated her left forearm transversely.  She was seen in emergency department where she was provided antibiotics, the wound was washed, and the skin was sutured in 3 places.  She is here today for further evaluation and and potentially surgical treatment.  Past Medical History  Diagnosis Date  . Depression   . Anxiety   . History of ovarian cyst   . Bartholin cyst LEFT SIDE  . Bicornuate uterus S/P EXCISION RIGHT UTERUS HORN 2007  . GERD (gastroesophageal reflux disease)     diet controlled - no meds  . Headache     sleep, xanax  . PTSD (post-traumatic stress disorder)   . Schizophrenia [facility])     Past Surgical History  Procedure Laterality Date  . Laparoscopic appendectomy and right ovarian cystectomy  09-04-2004  . Laparoscopic excision hypoplastic right uterine horn and right salpingoophectomy  03-18-2006  . Bartholin cyst marsupialization  07/18/2012    Procedure: BARTHOLIN CYST MARSUPIALIZATION;  Surgeon: Ok Edwards, MD;  Location: [facility];  Service: Gynecology;  Laterality: Left;  Left Bartholin Duct Marsupilization. One hour.   . Diagnostic laparoscopy    . Appendectomy    . Wisdom tooth extraction    . Bartholin gland cyst excision Left 02/11/2015    Procedure: BARTHOLIN GLAND EXCISION;  Surgeon: Mitchel Honour, DO;  Location: [facility];  Service: Gynecology;  Laterality: Left;    Family History  Problem Relation Age of Onset  . Diabetes Mother   . Hyperlipidemia Mother   . Bipolar disorder Mother   . Depression Mother   . Hypertension Maternal Grandmother   . Heart disease Maternal Grandmother 60  . Diabetes Maternal Grandfather   . Hypertension Maternal Grandfather   . Hyperlipidemia Maternal Grandfather   . Alcohol abuse  Maternal Grandfather   . Breast cancer Paternal Grandmother   . Cancer Paternal Grandmother     breast  . Alcohol abuse Father   . Heart disease Father     not sure what it is  . Drug abuse Maternal Aunt   . Schizophrenia Cousin   . Cancer - Colon Paternal Grandfather    Social History:  reports that she has been smoking Cigarettes.  She has a 6 pack-year smoking history. She has never used smokeless tobacco. She reports that she drinks alcohol. She reports that she does not use illicit drugs.  Allergies:  Allergies  Allergen Reactions  . Hydrocodone-Acetaminophen Itching    No prescriptions prior to admission    No results found for this or any previous visit (from the past 48 hour(s)). No results found.  Review of Systems  All other systems reviewed and are negative.   Last menstrual period 05/19/2016. Physical Exam  Constitutional:  WD, WN, NAD HEENT:  NCAT, EOMI Neuro/Psych:  Alert & oriented to person, place, and time; appropriate mood & affect Lymphatic: No generalized UE edema or lymphadenopathy Extremities / MSK:  Both UE are normal with respect to appearance, ranges of motion, joint stability, muscle strength/tone, sensation, & perfusion except as otherwise noted:  The left forearm proximal wound appears to be well approximated with no signs of infection.  There is a transverse more distal wound that has not been closed.  There are 3 prolene sutures in the  proximal wound, adequately approximating it.  The patient is capable of wrist extension and no wrist drop is noted, although it is quite painful.  She is also capable of finger extension although the long finger and ring finger are incapable of full extension with a lag noted at the MCP and are very painful.  Supination is very painful, but the patient is capable.  Ulnar deviation and radial deviation are intact.  The patient can make a full fist.  There seems to be sensitivity to light touch everywhere with the  exception of altered sensibility just distal to the laceration.  Labs / Xrays:  No radiographic studies obtained today.  Assessment: Left forearm laceration with tendon involvement  Plan:  The findings are discussed the patient.  She will be taken to surgery today for left dorsal forearm wound exploration and repair of structures as indicated.  The details of the operative procedure were discussed with the patient.  Questions were invited and answered.  In addition to the goal of the procedure, the risks of the procedure to include but not limited to bleeding; infection; damage to the nerves or blood vessels that could result in bleeding, numbness, weakness, chronic pain, and the need for additional procedures; stiffness; the need for revision surgery; and anesthetic risks were reviewed.  No specific outcome was guaranteed or implied.  Informed consent was obtained.She will return to clinic for reevaluation and will need to be seen at [facility] for a customized splint and to begin gentle range of motion exercises.  She was provided a prescription for Percocet 5/325 #60 today.  She will continue taking her Keflex until it is completed.  Ellee Wawrzyniak A., MD 05/29/2016, 9:31 AM

## 2016-05-29 NOTE — Interval H&P Note (Signed)
History and Physical Interval Note:  05/29/2016 12:34 PM  [name]  has presented today for surgery, with the diagnosis of Left Forearm Laceration  The various methods of treatment have been discussed with the patient and family. After consideration of risks, benefits and other options for treatment, the patient has consented to  Procedure(s) with comments: REPAIR MULTIPLE LACERATIONS (Left) - laceration left forearm  as a surgical intervention .  The patient's history has been reviewed, patient examined, no change in status, stable for surgery.  I have reviewed the patient's chart and labs.  Questions were answered to the patient's satisfaction.     [name] A.

## 2016-05-30 ENCOUNTER — Encounter (HOSPITAL_BASED_OUTPATIENT_CLINIC_OR_DEPARTMENT_OTHER): Payer: Self-pay | Admitting: Orthopedic Surgery

## 2016-06-07 ENCOUNTER — Ambulatory Visit: Payer: Medicaid Other | Attending: Orthopedic Surgery | Admitting: Occupational Therapy

## 2016-06-07 ENCOUNTER — Encounter: Payer: Self-pay | Admitting: Occupational Therapy

## 2016-06-07 DIAGNOSIS — M79645 Pain in left finger(s): Secondary | ICD-10-CM | POA: Insufficient documentation

## 2016-06-07 DIAGNOSIS — M25642 Stiffness of left hand, not elsewhere classified: Secondary | ICD-10-CM | POA: Diagnosis present

## 2016-06-07 DIAGNOSIS — R600 Localized edema: Secondary | ICD-10-CM

## 2016-06-07 DIAGNOSIS — M6281 Muscle weakness (generalized): Secondary | ICD-10-CM

## 2016-06-07 DIAGNOSIS — M79632 Pain in left forearm: Secondary | ICD-10-CM

## 2016-06-07 NOTE — Therapy (Signed)
[facility] [address] [place], [place], [address] Phone: [phone]   Fax:  [phone]  Occupational Therapy Evaluation  Patient Details  Name: [name] MRN: [id number] Date of Birth: 1986-12-28 Referring Provider: Dr. Mack Hook  Encounter Date: 06/07/2016      OT End of Session - 06/07/16 1025    Visit Number 1   Number of Visits 4   Authorization Type MCD - awaiting authorization   OT Start Time 0845   OT Stop Time 1005   OT Time Calculation (min) 80 min   Equipment Utilized During Treatment splint   Activity Tolerance Patient tolerated treatment well      Past Medical History  Diagnosis Date  . Depression   . Anxiety   . History of ovarian cyst   . Bartholin cyst LEFT SIDE  . Bicornuate uterus S/P EXCISION RIGHT UTERUS HORN 2007  . GERD (gastroesophageal reflux disease)     diet controlled - no meds  . Headache     sleep, xanax  . PTSD (post-traumatic stress disorder)   . Schizophrenia [facility])     Past Surgical History  Procedure Laterality Date  . Laparoscopic appendectomy and right ovarian cystectomy  09-04-2004  . Laparoscopic excision hypoplastic right uterine horn and right salpingoophectomy  03-18-2006  . Bartholin cyst marsupialization  07/18/2012    Procedure: BARTHOLIN CYST MARSUPIALIZATION;  Surgeon: Ok Edwards, MD;  Location: [facility];  Service: Gynecology;  Laterality: Left;  Left Bartholin Duct Marsupilization. One hour.   . Diagnostic laparoscopy    . Appendectomy    . Wisdom tooth extraction    . Bartholin gland cyst excision Left 02/11/2015    Procedure: BARTHOLIN GLAND EXCISION;  Surgeon: Mitchel Honour, DO;  Location: [facility];  Service: Gynecology;  Laterality: Left;  . Laceration repair Left 05/29/2016    Procedure: REPAIR MULTIPLE LACERATIONS;  Surgeon: Mack Hook, MD;  Location: [facility];  Service: Orthopedics;  Laterality: Left;   laceration left forearm   . Tendon repair Left 05/29/2016    Procedure: TENDON REPAIR;  Surgeon: Mack Hook, MD;  Location: [facility];  Service: Orthopedics;  Laterality: Left;    There were no vitals filed for this visit.      Subjective Assessment - 06/07/16 0855    Subjective  I think I've used it more than I should and the pain reminds me   Pertinent History PTSD   Patient Stated Goals To get my Lt arm better   Currently in Pain? Yes   Pain Score 5   can go from 3-7/10   Pain Location --  forearm to 3rd digit   Pain Orientation Left   Pain Descriptors / Indicators Shooting   Pain Type Surgical pain   Pain Onset 1 to 4 weeks ago   Pain Frequency Constant           [facility] OT Assessment - 06/07/16 0001    Assessment   Diagnosis s/p extensor tendon repair (zone VII)  repair to [facility] and [facility]   Referring Provider Dr. Mack Hook   Onset Date 05/29/16  surgery date   Assessment Pt arrived wrapped/protected with IP's free upon arrival   Prior Therapy none   Precautions   Precautions Other (comment)   Precaution Comments per extensor tendon (zone 7) protocol   Required Braces or Orthoses Other Brace/Splint   Other Brace/Splint volar forearm splint with wrist in 30 degrees extension, MP's  in full extension, and IP's free per protocol   Home  Environment   Lives With Significant other  girlfriend   Prior Function   Level of Independence Independent   Vocation On disability   ADL   ADL comments Pt requires assist for all ADLS at this time   Written Expression   Dominant Hand Right   Handwriting --  denies change   Edema   Edema Lt dorsal proximal forearm around proximal incisions                  OT Treatments/Exercises (OP) - 06/07/16 0001    ADLs   ADL Comments Discussed precautions, signs and symptoms of infection, splint wear and care, and initial IP flex/ext ex's within confines of splint. Pt verbalized understanding with all  education. Pt with moderate swelling around proximal incision, and some clear oozing - pt told to monitor closely and to call MD if signs of infection begin (signs of infection reviewed) - pt agreed. Post-surgical dressing carefully removed, hand/forearm cleaned and dried, and clean stockinette applied prior to splint fabrication while keeping hand/forearm immobolized   Splinting   Splinting Fabricated and fitted static volar forearm based splint with wrist in 30 degrees extension, MP's fully extended, and IP's free per zone VII extensor tendon repair protocol. Issued splint               OT Education - 06/07/16 0957    Education provided Yes   Education Details Splint wear and care, precautions, hygiene care, signs/symptoms of infection, and initial HEP    Person(s) Educated Patient   Methods Explanation;Demonstration;Handout   Comprehension Verbalized understanding;Returned demonstration          OT Short Term Goals - 06/07/16 1032    OT SHORT TERM GOAL #1   Title Independent with splint wear and care   Baseline issued, may need adjustments   Time 4   Period Weeks   Status On-going   OT SHORT TERM GOAL #2   Title Independent with initial HEP    Baseline issued initial ex's, will need updates   Time 4   Status On-going           OT Long Term Goals - 06/07/16 1032    OT LONG TERM GOAL #1   Title Independent with strengtheing HEP    Baseline dependent d/t current precautions   Time 8   Period Weeks   Status New   OT LONG TERM GOAL #2   Title Pain less than or equal to 3/10 with all ADLS    Baseline 7/10    Time 8   Period Weeks   Status New   OT LONG TERM GOAL #3   Title Pt to return to using Lt hand as assist for all ADLS   Baseline Dependent d/t current precautions   Time 8   Period Weeks   Status New   OT LONG TERM GOAL #4   Title Grip strength Lt hand to be 25 lbs or greater to assist with opening jars/containers   Baseline not tested d/t current  precautions   Time 8   Period Weeks   Status New   OT LONG TERM GOAL #5   Title Pt to demo wrist and 3rd digit MP extension WFL's for all tasks   Baseline dependent d/t current precautions   Time 8   Period Weeks   Status New  Plan - 06/07/16 1026    Clinical Impression Statement Pt is a 30 y.o. female who presents to outpatient rehab s/p extensor tendon reapair zone 7 (60454) left forearm on 05/29/16. Pt arrived wrapped/protected in post-surgical dressing with IP's free. Splint fabricated today. Precautions/restrictions reviewed which currently limits pt's ability to perform all ADLS and bilateral tasks.    Rehab Potential Fair   Clinical Impairments Affecting Rehab Potential PTSD, Limited MCD visit allotment    OT Frequency --  3 visits over 10 week duration   OT Treatment/Interventions Self-care/ADL training;Patient/family education;Therapeutic exercise;Ultrasound;Manual Therapy;Splinting;DME and/or AE instruction;Parrafin;Cryotherapy;Compression bandaging;Therapeutic activities;Electrical Stimulation;Fluidtherapy;Scar mobilization;Moist Heat;Contrast Bath;Passive range of motion   Plan splint check/adjustments prn, follow protocol    Consulted and Agree with Plan of Care Patient      Patient will benefit from skilled therapeutic intervention in order to improve the following deficits and impairments:  Decreased coordination, Decreased range of motion, Impaired flexibility, Increased edema, Impaired sensation, Decreased knowledge of precautions, Decreased skin integrity, Decreased knowledge of use of DME, Decreased scar mobility, Impaired UE functional use, Pain, Decreased strength  Visit Diagnosis: Pain in left forearm - Plan: Ot plan of care cert/re-cert  Pain in left finger(s) - Plan: Ot plan of care cert/re-cert  Stiffness of left hand, not elsewhere classified - Plan: Ot plan of care cert/re-cert  Muscle weakness (generalized) - Plan: Ot plan of care  cert/re-cert  Localized edema - Plan: Ot plan of care cert/re-cert    Problem List Patient Active Problem List   Diagnosis Date Noted  . Recurrent left ovarian cyst 05/21/2016  . Severe recurrent major depression with psychotic features (HCC) 06/07/2015  . PTSD (post-traumatic stress disorder) 06/07/2015  . Dissociative disorder 06/07/2015  . Chronic female pelvic pain 07/22/2013  . Neurosis, posttraumatic 07/22/2013  . Bartholin gland cyst 08/21/2012  . Cigarette smoker one half pack a day or less 03/26/2012    Kelli Churn, OTR/L 06/07/2016, 10:40 AM  [facility] [address] [place], [place], [address] Phone: [phone]   Fax:  [phone]  Name: [name] MRN: [id number] Date of Birth: April 05, 1986

## 2016-06-07 NOTE — Patient Instructions (Signed)
  WEARING SCHEDULE:  Wear splint at ALL times except for hygiene care (May remove splint for exercises and then immediately place back on ONLY if directed by the therapist)  PURPOSE:  To prevent movement and for protection until injury can heal   CARE OF SPLINT:  Keep splint away from heat sources including: stove, radiator or furnace, or a car in sunlight. The splint can melt and will no longer fit you properly  Keep away from pets and children  Clean the splint with rubbing alcohol 1-2 times per day.  * During this time, make sure you also clean your hand/arm as instructed by your therapist and/or perform dressing changes as needed. Then dry hand/arm completely before replacing splint. (When cleaning hand/arm, keep it immobilized in same position until splint is replaced)  PRECAUTIONS/POTENTIAL PROBLEMS: *If you notice or experience increased pain, swelling, numbness, or a lingering reddened area from the splint: Contact your therapist immediately by calling [phone]. You must wear the splint for protection, but we will get you scheduled for adjustments as quickly as possible.  (If only straps or hooks need to be replaced and NO adjustments to the splint need to be made, just call the office ahead and let them know you are coming in)  If you have any medical concerns or signs of infection, please call your doctor immediately    ONLY DO BELOW EXERCISE WITH SPLINT ON - ALL FINGERS TOGETHER  PIP Flexion / Extension    Using other hand's index finger and thumb, brace ________ finger to prevent bending of bottom knuckle. Actively bend middle knuckle. Relax. Straighten finger as far as possible. Hold each position ___ seconds. Repeat ___ times per session. Do ___ sessions per day.  Copyright  VHI. All rights reserved.

## 2016-06-14 ENCOUNTER — Ambulatory Visit: Payer: Medicaid Other | Admitting: Occupational Therapy

## 2016-06-14 DIAGNOSIS — M79632 Pain in left forearm: Secondary | ICD-10-CM

## 2016-06-14 DIAGNOSIS — M79645 Pain in left finger(s): Secondary | ICD-10-CM

## 2016-06-14 NOTE — Therapy (Signed)
[facility] [address] [place], [place], [address] Phone: [phone]   Fax:  [phone]  Occupational Therapy Treatment  Patient Details  Name: [name] MRN: [id number] Date of Birth: July 13, 1986 Referring Provider: Dr. Mack Hook  Encounter Date: 06/14/2016      OT End of Session - 06/14/16 0925    Visit Number 2   Authorization Type MCD - awaiting authorization   OT Start Time 0850   OT Stop Time 0920   OT Time Calculation (min) 30 min   Equipment Utilized During Treatment splint   Activity Tolerance Patient tolerated treatment well      Past Medical History  Diagnosis Date  . Depression   . Anxiety   . History of ovarian cyst   . Bartholin cyst LEFT SIDE  . Bicornuate uterus S/P EXCISION RIGHT UTERUS HORN 2007  . GERD (gastroesophageal reflux disease)     diet controlled - no meds  . Headache     sleep, xanax  . PTSD (post-traumatic stress disorder)   . Schizophrenia [facility])     Past Surgical History  Procedure Laterality Date  . Laparoscopic appendectomy and right ovarian cystectomy  09-04-2004  . Laparoscopic excision hypoplastic right uterine horn and right salpingoophectomy  03-18-2006  . Bartholin cyst marsupialization  07/18/2012    Procedure: BARTHOLIN CYST MARSUPIALIZATION;  Surgeon: Ok Edwards, MD;  Location: [facility];  Service: Gynecology;  Laterality: Left;  Left Bartholin Duct Marsupilization. One hour.   . Diagnostic laparoscopy    . Appendectomy    . Wisdom tooth extraction    . Bartholin gland cyst excision Left 02/11/2015    Procedure: BARTHOLIN GLAND EXCISION;  Surgeon: Mitchel Honour, DO;  Location: [facility];  Service: Gynecology;  Laterality: Left;  . Laceration repair Left 05/29/2016    Procedure: REPAIR MULTIPLE LACERATIONS;  Surgeon: Mack Hook, MD;  Location: [facility];  Service: Orthopedics;  Laterality: Left;  laceration left  forearm   . Tendon repair Left 05/29/2016    Procedure: TENDON REPAIR;  Surgeon: Mack Hook, MD;  Location: [facility];  Service: Orthopedics;  Laterality: Left;    There were no vitals filed for this visit.      Subjective Assessment - 06/14/16 0922    Subjective  My straps are falling apart and I need the splint adjusted   Pertinent History PTSD   Patient Stated Goals To get my Lt arm better                      OT Treatments/Exercises (OP) - 06/14/16 0001    Splinting   Splinting Adjusted splint for increased comfort by rolling back at proximal phalanges, at thumb web space, and proximally at forearm. Also added cushioned liner at proximal phalanges and around thumb for increased comfort. Replaced all straps. Pt provided with extra stockinette, velcro hooks, and liner                  OT Short Term Goals - 06/07/16 1032    OT SHORT TERM GOAL #1   Title Independent with splint wear and care   Baseline issued, may need adjustments   Time 4   Period Weeks   Status On-going   OT SHORT TERM GOAL #2   Title Independent with initial HEP    Baseline issued initial ex's, will need updates   Time 4   Status On-going  OT Long Term Goals - 06/07/16 1032    OT LONG TERM GOAL #1   Title Independent with strengtheing HEP    Baseline dependent d/t current precautions   Time 8   Period Weeks   Status New   OT LONG TERM GOAL #2   Title Pain less than or equal to 3/10 with all ADLS    Baseline 7/10    Time 8   Period Weeks   Status New   OT LONG TERM GOAL #3   Title Pt to return to using Lt hand as assist for all ADLS   Baseline Dependent d/t current precautions   Time 8   Period Weeks   Status New   OT LONG TERM GOAL #4   Title Grip strength Lt hand to be 25 lbs or greater to assist with opening jars/containers   Baseline not tested d/t current precautions   Time 8   Period Weeks   Status New   OT LONG TERM GOAL #5    Title Pt to demo wrist and 3rd digit MP extension WFL's for all tasks   Baseline dependent d/t current precautions   Time 8   Period Weeks   Status New               Plan - 06/14/16 5176    Clinical Impression Statement Pt seen today for splinting adjustments only - unable to progress protocol at this time.    Rehab Potential Fair   Clinical Impairments Affecting Rehab Potential PTSD, Limited MCD visit allotment    OT Treatment/Interventions Self-care/ADL training;Patient/family education;Therapeutic exercise;Ultrasound;Manual Therapy;Splinting;DME and/or AE instruction;Parrafin;Cryotherapy;Compression bandaging;Therapeutic activities;Electrical Stimulation;Fluidtherapy;Scar mobilization;Moist Heat;Contrast Bath;Passive range of motion   Plan follow protocol   Consulted and Agree with Plan of Care Patient      Patient will benefit from skilled therapeutic intervention in order to improve the following deficits and impairments:  Decreased coordination, Decreased range of motion, Impaired flexibility, Increased edema, Impaired sensation, Decreased knowledge of precautions, Decreased skin integrity, Decreased knowledge of use of DME, Decreased scar mobility, Impaired UE functional use, Pain, Decreased strength  Visit Diagnosis: Pain in left forearm  Pain in left finger(s)    Problem List Patient Active Problem List   Diagnosis Date Noted  . Recurrent left ovarian cyst 05/21/2016  . Severe recurrent major depression with psychotic features (HCC) 06/07/2015  . PTSD (post-traumatic stress disorder) 06/07/2015  . Dissociative disorder 06/07/2015  . Chronic female pelvic pain 07/22/2013  . Neurosis, posttraumatic 07/22/2013  . Bartholin gland cyst 08/21/2012  . Cigarette smoker one half pack a day or less 03/26/2012    Kelli Churn, OTR/L 06/14/2016, 9:27 AM  [facility] [address] [place],  [place], [address] Phone: [phone]   Fax:  [phone]  Name: [name] MRN: [id number] Date of Birth: Feb 28, 1986

## 2016-06-26 ENCOUNTER — Ambulatory Visit: Payer: Medicaid Other | Admitting: Occupational Therapy

## 2016-06-27 ENCOUNTER — Ambulatory Visit: Payer: Medicaid Other | Admitting: Occupational Therapy

## 2016-06-27 DIAGNOSIS — M79645 Pain in left finger(s): Secondary | ICD-10-CM

## 2016-06-27 DIAGNOSIS — M79632 Pain in left forearm: Secondary | ICD-10-CM | POA: Diagnosis not present

## 2016-06-27 DIAGNOSIS — M6281 Muscle weakness (generalized): Secondary | ICD-10-CM

## 2016-06-27 DIAGNOSIS — R600 Localized edema: Secondary | ICD-10-CM

## 2016-06-27 DIAGNOSIS — M25642 Stiffness of left hand, not elsewhere classified: Secondary | ICD-10-CM

## 2016-06-27 NOTE — Therapy (Signed)
[facility] [address] [place], [place], [address] Phone: [phone]   Fax:  [phone]  Occupational Therapy Treatment  Patient Details  Name: [name] MRN: [id number] Date of Birth: 1986-04-20 Referring Provider: Dr. Mack Hook  Encounter Date: 06/27/2016      OT End of Session - 06/27/16 1331    Visit Number 3   Number of Visits 5   Authorization Type MCD -    Authorization Time Period 3 visits through 09/24/16   Authorization - Visit Number 1   Authorization - Number of Visits 3   OT Start Time 1108   OT Stop Time 1214   OT Time Calculation (min) 66 min      Past Medical History  Diagnosis Date  . Depression   . Anxiety   . History of ovarian cyst   . Bartholin cyst LEFT SIDE  . Bicornuate uterus S/P EXCISION RIGHT UTERUS HORN 2007  . GERD (gastroesophageal reflux disease)     diet controlled - no meds  . Headache     sleep, xanax  . PTSD (post-traumatic stress disorder)   . Schizophrenia [facility])     Past Surgical History  Procedure Laterality Date  . Laparoscopic appendectomy and right ovarian cystectomy  09-04-2004  . Laparoscopic excision hypoplastic right uterine horn and right salpingoophectomy  03-18-2006  . Bartholin cyst marsupialization  07/18/2012    Procedure: BARTHOLIN CYST MARSUPIALIZATION;  Surgeon: Ok Edwards, MD;  Location: [facility];  Service: Gynecology;  Laterality: Left;  Left Bartholin Duct Marsupilization. One hour.   . Diagnostic laparoscopy    . Appendectomy    . Wisdom tooth extraction    . Bartholin gland cyst excision Left 02/11/2015    Procedure: BARTHOLIN GLAND EXCISION;  Surgeon: Mitchel Honour, DO;  Location: [facility];  Service: Gynecology;  Laterality: Left;  . Laceration repair Left 05/29/2016    Procedure: REPAIR MULTIPLE LACERATIONS;  Surgeon: Mack Hook, MD;  Location: [facility];  Service: Orthopedics;  Laterality:  Left;  laceration left forearm   . Tendon repair Left 05/29/2016    Procedure: TENDON REPAIR;  Surgeon: Mack Hook, MD;  Location: [facility];  Service: Orthopedics;  Laterality: Left;    There were no vitals filed for this visit.       Pt arrived wearing splint. Crack noted along oneside. Therapist reinforced splint with additional splinting material and cut down splint at PIP joints as pt has a blister on ring and small finger from splint Padding added at digits and thumb portion. Pt reported improved comfort. Pt reports her forearm incision got infected and she is not on antibiotics. Incision is now closed. Pt was instructed in updated HEP for A/ROM out of splint per protocol (for 4 weeks post op), pt returned demonstration.                     OT Education - 06/27/16 1333    Education provided Yes   Education Details splint wear, care and precautions, to contact therapist if problems with splint, to contact MD if s/s of infection, A/ROM HEP out of splint   Person(s) Educated Patient   Methods Explanation;Demonstration;Handout;Verbal cues   Comprehension Verbalized understanding;Returned demonstration;Verbal cues required          OT Short Term Goals - 06/07/16 1032    OT SHORT TERM GOAL #1   Title Independent with splint wear and care   Baseline  issued, may need adjustments   Time 4   Period Weeks   Status On-going   OT SHORT TERM GOAL #2   Title Independent with initial HEP    Baseline issued initial ex's, will need updates   Time 4   Status On-going           OT Long Term Goals - 06/07/16 1032    OT LONG TERM GOAL #1   Title Independent with strengtheing HEP    Baseline dependent d/t current precautions   Time 8   Period Weeks   Status New   OT LONG TERM GOAL #2   Title Pain less than or equal to 3/10 with all ADLS    Baseline 7/10    Time 8   Period Weeks   Status New   OT LONG TERM GOAL #3   Title Pt to return to  using Lt hand as assist for all ADLS   Baseline Dependent d/t current precautions   Time 8   Period Weeks   Status New   OT LONG TERM GOAL #4   Title Grip strength Lt hand to be 25 lbs or greater to assist with opening jars/containers   Baseline not tested d/t current precautions   Time 8   Period Weeks   Status New   OT LONG TERM GOAL #5   Title Pt to demo wrist and 3rd digit MP extension WFL's for all tasks   Baseline dependent d/t current precautions   Time 8   Period Weeks   Status New               Plan - 06/27/16 1329    Clinical Impression Statement Pt arrived wearing splint. Splint was cracked and rubbing digits at PIP joints and thumb. Therapist progressed HEP per extensor tendon protocol.   Rehab Potential Fair   Clinical Impairments Affecting Rehab Potential PTSD, Limited MCD visit allotment    OT Frequency 1x / week  3 visits x 10 weeks   OT Treatment/Interventions Self-care/ADL training;Patient/family education;Therapeutic exercise;Ultrasound;Manual Therapy;Splinting;DME and/or AE instruction;Parrafin;Cryotherapy;Compression bandaging;Therapeutic activities;Electrical Stimulation;Fluidtherapy;Scar mobilization;Moist Heat;Contrast Bath;Passive range of motion   Plan pt initiated A/ROM out of splint( as pt is 4 wks post op) progress per protocol   Consulted and Agree with Plan of Care Patient      Patient will benefit from skilled therapeutic intervention in order to improve the following deficits and impairments:  Decreased coordination, Decreased range of motion, Impaired flexibility, Increased edema, Impaired sensation, Decreased knowledge of precautions, Decreased skin integrity, Decreased knowledge of use of DME, Decreased scar mobility, Impaired UE functional use, Pain, Decreased strength  Visit Diagnosis: Pain in left forearm  Pain in left finger(s)  Stiffness of left hand, not elsewhere classified  Muscle weakness (generalized)  Localized  edema    Problem List Patient Active Problem List   Diagnosis Date Noted  . Recurrent left ovarian cyst 05/21/2016  . Severe recurrent major depression with psychotic features (HCC) 06/07/2015  . PTSD (post-traumatic stress disorder) 06/07/2015  . Dissociative disorder 06/07/2015  . Chronic female pelvic pain 07/22/2013  . Neurosis, posttraumatic 07/22/2013  . Bartholin gland cyst 08/21/2012  . Cigarette smoker one half pack a day or less 03/26/2012    Raghav Verrilli 06/27/2016, 1:51 PM  [facility] [address] [place], [place], [address] Phone: [phone]   Fax:  [phone]  Name: [name] MRN: [id number] Date of Birth: 20-Jan-1986

## 2016-06-27 NOTE — Patient Instructions (Signed)
    MP Flexion (Active Isolated)   Bend __all____ fingers at large knuckle, keeping other fingers straight. Do not bend tips. Repeat _10-15___ times. Do __4-6__ sessions per day.      AROM: Finger Flexion / Extension   Actively bend fingers of  hand. Start with knuckles furthest from palm, and slowly make a fist. Hold __5__ seconds. Relax. Then straighten fingers as far as possible. Repeat _10-15___ times per set.  Do _4-6___ sessions per day.  Copyright  VHI. All rights reserved.    With wrist slightly bent open and close fingers, 10 x Repeat with wrist slightly bent then wrist in extension 10 reps 4-6x day      AROM: Wrist Flexion / Extension    Actively bend right wrist forward then back as far as possible. Repeat _10___ times per set. Do ___1_ sets per session. Do 4-6____ sessions per day.  Copyright  VHI. All rights reserved.  AROM: Wrist Radial / Ulnar Deviation    Gently bend left wrist from side to side as far as possible. Repeat _10___ times per set. Do __1_ sets per session. Do __4-6__ sessions per day.      Bend fingers to make a fist then gently bend wrist 10 reps 4-6 x day

## 2016-07-02 ENCOUNTER — Ambulatory Visit (HOSPITAL_COMMUNITY)
Admission: RE | Admit: 2016-07-02 | Discharge: 2016-07-02 | Disposition: A | Discharge status: Home / Self Care | Payer: Medicaid Other | Source: Ambulatory Visit | Attending: Obstetrics & Gynecology | Admitting: Obstetrics & Gynecology

## 2016-07-02 DIAGNOSIS — N83202 Unspecified ovarian cyst, left side: Secondary | ICD-10-CM | POA: Insufficient documentation

## 2016-07-04 ENCOUNTER — Encounter: Payer: Self-pay | Admitting: General Practice

## 2016-07-04 ENCOUNTER — Telehealth: Payer: Self-pay | Admitting: General Practice

## 2016-07-04 NOTE — Telephone Encounter (Signed)
Per Dr Macon Large, ultrasound shows Simple 1 cm left ovarian cyst, this is a functional cyst and there is no need for further intervention or follow up. Continue OCPs as prescribed. Called patient & phone rang then disconnected. Called alternative number & a woman answered then hung up the phone. Will send letter.

## 2016-07-06 ENCOUNTER — Ambulatory Visit: Payer: Medicaid Other | Admitting: Obstetrics & Gynecology

## 2016-07-10 ENCOUNTER — Ambulatory Visit: Payer: Medicaid Other | Attending: Orthopedic Surgery | Admitting: Occupational Therapy

## 2016-07-17 ENCOUNTER — Ambulatory Visit: Payer: Medicaid Other | Admitting: Occupational Therapy

## 2016-10-14 ENCOUNTER — Ambulatory Visit (HOSPITAL_COMMUNITY)
Admission: EM | Admit: 2016-10-14 | Discharge: 2016-10-14 | Disposition: A | Discharge status: Home / Self Care | Payer: Medicaid Other | Attending: Internal Medicine | Admitting: Internal Medicine

## 2016-10-14 ENCOUNTER — Encounter (HOSPITAL_COMMUNITY): Payer: Self-pay | Admitting: Family Medicine

## 2016-10-14 ENCOUNTER — Ambulatory Visit (INDEPENDENT_AMBULATORY_CARE_PROVIDER_SITE_OTHER): Payer: Medicaid Other

## 2016-10-14 DIAGNOSIS — S60459A Superficial foreign body of unspecified finger, initial encounter: Secondary | ICD-10-CM

## 2016-10-14 DIAGNOSIS — M779 Enthesopathy, unspecified: Secondary | ICD-10-CM | POA: Diagnosis not present

## 2016-10-14 NOTE — ED Triage Notes (Signed)
Pt here for pain in index finger and right thumb and wrist. Worse when gripping, twisting anything.

## 2016-10-15 NOTE — ED Provider Notes (Signed)
CSN: [id number]     Arrival date & time 10/14/16  1951 History   First MD Initiated Contact with Patient 10/14/16 2019     Chief Complaint  Patient presents with  . Hand Pain   (Consider location/radiation/quality/duration/timing/severity/associated sxs/prior Treatment) HPI NP 30` y/o f with 2 issues  Left index finger she has noted a lump on the distal DIP that has been present for days. Some discomfort. No known injury C/o right wrist pain. Unable to twist off lids to bottles. Thinks it may be work related.  No home treatment. No known injury. Past Medical History:  Diagnosis Date  . Anxiety   . Bartholin cyst LEFT SIDE  . Bicornuate uterus S/P EXCISION RIGHT UTERUS HORN 2007  . Depression   . GERD (gastroesophageal reflux disease)    diet controlled - no meds  . Headache    sleep, xanax  . History of ovarian cyst   . PTSD (post-traumatic stress disorder)   . Schizophrenia [facility])    Past Surgical History:  Procedure Laterality Date  . APPENDECTOMY    . BARTHOLIN CYST MARSUPIALIZATION  07/18/2012   Procedure: BARTHOLIN CYST MARSUPIALIZATION;  Surgeon: Ok Edwards, MD;  Location: [facility];  Service: Gynecology;  Laterality: Left;  Left Bartholin Duct Marsupilization. One hour.   Marland Kitchen BARTHOLIN GLAND CYST EXCISION Left 02/11/2015   Procedure: BARTHOLIN GLAND EXCISION;  Surgeon: Mitchel Honour, DO;  Location: [facility];  Service: Gynecology;  Laterality: Left;  . DIAGNOSTIC LAPAROSCOPY    . LACERATION REPAIR Left 05/29/2016   Procedure: REPAIR MULTIPLE LACERATIONS;  Surgeon: Mack Hook, MD;  Location: [facility];  Service: Orthopedics;  Laterality: Left;  laceration left forearm   . LAPAROSCOPIC APPENDECTOMY AND RIGHT OVARIAN CYSTECTOMY  09-04-2004  . LAPAROSCOPIC EXCISION HYPOPLASTIC RIGHT UTERINE HORN AND RIGHT SALPINGOOPHECTOMY  03-18-2006  . TENDON REPAIR Left 05/29/2016   Procedure: TENDON REPAIR;  Surgeon: Mack Hook, MD;  Location:  [facility];  Service: Orthopedics;  Laterality: Left;  . WISDOM TOOTH EXTRACTION     Family History  Problem Relation Age of Onset  . Diabetes Mother   . Hyperlipidemia Mother   . Bipolar disorder Mother   . Depression Mother   . Hypertension Maternal Grandmother   . Heart disease Maternal Grandmother 60  . Diabetes Maternal Grandfather   . Hypertension Maternal Grandfather   . Hyperlipidemia Maternal Grandfather   . Alcohol abuse Maternal Grandfather   . Breast cancer Paternal Grandmother   . Cancer Paternal Grandmother     breast  . Alcohol abuse Father   . Heart disease Father     not sure what it is  . Cancer - Colon Paternal Grandfather   . Drug abuse Maternal Aunt   . Schizophrenia Cousin    Social History  Substance Use Topics  . Smoking status: Current Every Day Smoker    Packs/day: 0.50    Years: 12.00    Types: Cigarettes  . Smokeless tobacco: Never Used  . Alcohol use Yes     Comment: seldom   OB History    Gravida Para Term Preterm AB Living   0             SAB TAB Ectopic Multiple Live Births                 Review of Systems  Denies: HEADACHE, NAUSEA, ABDOMINAL PAIN, CHEST PAIN, CONGESTION, DYSURIA, SHORTNESS OF BREATH  Allergies  Hydrocodone-acetaminophen  Home Medications  Prior to Admission medications   Medication Sig Start Date End Date Taking? Authorizing Provider  ALPRAZolam Prudy Feeler) 1 MG tablet Take 1 mg by mouth 2 (two) times daily as needed for anxiety. Reported on 06/07/2016    Historical Provider, MD  amphetamine-dextroamphetamine (ADDERALL) 20 MG tablet Take 20 mg by mouth daily.    Historical Provider, MD  BuPROPion HCl (WELLBUTRIN PO) Take 1 tablet by mouth daily.     Historical Provider, MD  cephALEXin (KEFLEX) 500 MG capsule Take 1 capsule (500 mg total) by mouth 4 (four) times daily. Patient not taking: Reported on 06/07/2016 05/28/16   Tatyana Kirichenko, PA-C  citalopram (CELEXA) 20 MG tablet Take 30 mg by mouth  daily.    Historical Provider, MD  ibuprofen (ADVIL,MOTRIN) 800 MG tablet Take 800 mg by mouth every 8 (eight) hours as needed for headache or moderate pain. Reported on 06/07/2016    Historical Provider, MD  Loratadine (CLARITIN) 10 MG CAPS Take 10 mg by mouth daily.    Historical Provider, MD  naproxen (NAPROSYN) 500 MG tablet Take 1 tablet (500 mg total) by mouth 2 (two) times daily with a meal. As needed for pain Patient not taking: Reported on 06/07/2016 05/21/16   Tereso Newcomer, MD  Norethindrone Acetate-Ethinyl Estrad-FE (LOESTRIN 24 FE) 1-20 MG-MCG(24) tablet Take 1 tablet by mouth daily. 05/21/16   Tereso Newcomer, MD  OLANZapine (ZYPREXA) 5 MG tablet Take 5 mg by mouth at bedtime.    Historical Provider, MD  omeprazole (PRILOSEC) 10 MG capsule Take 10 mg by mouth daily.    Historical Provider, MD  ondansetron (ZOFRAN ODT) 4 MG disintegrating tablet 4mg  ODT q4 hours prn nausea/vomit Patient not taking: Reported on 06/07/2016 05/06/16   Dahlia Client Muthersbaugh, PA-C  oxyCODONE-acetaminophen (PERCOCET) 5-325 MG tablet Take 1 tablet by mouth every 4 (four) hours as needed for severe pain. Patient not taking: Reported on 06/07/2016 05/28/16   Jaynie Crumble, PA-C  traZODone (DESYREL) 100 MG tablet Take 200 mg by mouth at bedtime.     Historical Provider, MD   Meds Ordered and Administered this Visit  Medications - No data to display  BP 136/80 (BP Location: Right Arm)   Pulse 103   Temp 98.4 F (36.9 C) (Oral)   Resp 18   LMP 09/14/2016 (Exact Date)   SpO2 100%  No data found.   Physical Exam NURSES NOTES AND VITAL SIGNS REVIEWED. CONSTITUTIONAL: Well developed, well nourished, no acute distress HEENT: normocephalic, atraumatic EYES: Conjunctiva normal NECK:normal ROM, supple, no adenopathy PULMONARY:No respiratory distress, normal effort ABDOMINAL: Soft, ND, NT BS+, No CVAT MUSCULOSKELETAL: Normal ROM of all extremities,left index finger there is a firm granuloma lesion noted without  redness, Right wrist flexor tendon crepitus. No neuro deficits.   SKIN: warm and dry without rash PSYCHIATRIC: Mood and affect, behavior are normal  Urgent Care Course   Clinical Course    Procedures (including critical care time)  Labs Review Labs Reviewed - No data to display  Imaging Review Dg Finger Index Left  Result Date: 10/14/2016 CLINICAL DATA:  Painful index finger.  No known injury. EXAM: LEFT INDEX FINGER 2+V COMPARISON:  None. FINDINGS: Negative for fracture or arthropathy Radiopaque foreign body in the soft tissues ventral to the DIP joint. This has angular margins and may be a small piece of glass or plastic. Negative for osteomyelitis. IMPRESSION: Radiopaque foreign body in the soft tissue ventral to the DIP joint. No osseous abnormality. Electronically Signed   By: Leonette Most  Chestine Sporelark M.D.   On: 10/14/2016 21:11     Visual Acuity Review  Right Eye Distance:   Left Eye Distance:   Bilateral Distance:    Right Eye Near:   Left Eye Near:    Bilateral Near:         MDM   1. Foreign body in skin of finger, initial encounter   2. Tendonitis     Patient is reassured that there are no issues that require transfer to higher level of care at this time or additional tests. Patient is advised to continue home symptomatic treatment. Patient is advised that if there are new or worsening symptoms to attend the emergency department, contact primary care provider, or return to UC. Instructions of care provided discharged home in stable condition.    THIS NOTE WAS GENERATED USING A VOICE RECOGNITION SOFTWARE PROGRAM. ALL REASONABLE EFFORTS  WERE MADE TO PROOFREAD THIS DOCUMENT FOR ACCURACY.  I have verbally reviewed the discharge instructions with the patient. A printed AVS was given to the patient.  All questions were answered prior to discharge.      Tharon AquasFrank C Patrick, PA 10/15/16 1358

## 2017-02-24 ENCOUNTER — Emergency Department
Admission: EM | Admit: 2017-02-24 | Discharge: 2017-02-24 | Disposition: A | Discharge status: Home / Self Care | Payer: Medicaid Other | Attending: Emergency Medicine | Admitting: Emergency Medicine

## 2017-02-24 ENCOUNTER — Encounter: Payer: Self-pay | Admitting: Emergency Medicine

## 2017-02-24 DIAGNOSIS — F17210 Nicotine dependence, cigarettes, uncomplicated: Secondary | ICD-10-CM | POA: Insufficient documentation

## 2017-02-24 DIAGNOSIS — Z23 Encounter for immunization: Secondary | ICD-10-CM | POA: Diagnosis not present

## 2017-02-24 DIAGNOSIS — W51XXXA Accidental striking against or bumped into by another person, initial encounter: Secondary | ICD-10-CM | POA: Insufficient documentation

## 2017-02-24 DIAGNOSIS — Y999 Unspecified external cause status: Secondary | ICD-10-CM | POA: Insufficient documentation

## 2017-02-24 DIAGNOSIS — Y9389 Activity, other specified: Secondary | ICD-10-CM | POA: Diagnosis not present

## 2017-02-24 DIAGNOSIS — Z79899 Other long term (current) drug therapy: Secondary | ICD-10-CM | POA: Diagnosis not present

## 2017-02-24 DIAGNOSIS — S41112A Laceration without foreign body of left upper arm, initial encounter: Secondary | ICD-10-CM

## 2017-02-24 DIAGNOSIS — Y929 Unspecified place or not applicable: Secondary | ICD-10-CM | POA: Insufficient documentation

## 2017-02-24 MED ORDER — LIDOCAINE HCL (PF) 1 % IJ SOLN
INTRAMUSCULAR | Status: AC
  Administered 2017-02-24: 5 mL via INTRADERMAL
  Filled 2017-02-24: qty 5

## 2017-02-24 MED ORDER — TETANUS-DIPHTH-ACELL PERTUSSIS 5-2.5-18.5 LF-MCG/0.5 IM SUSP
0.5000 mL | Freq: Once | INTRAMUSCULAR | Status: AC
  Administered 2017-02-24: 0.5 mL via INTRAMUSCULAR
  Filled 2017-02-24: qty 0.5

## 2017-02-24 MED ORDER — LIDOCAINE HCL (PF) 1 % IJ SOLN
5.0000 mL | Freq: Once | INTRAMUSCULAR | Status: AC
  Administered 2017-02-24: 5 mL via INTRADERMAL
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
## 2017-02-24 NOTE — Discharge Instructions (Signed)
Please follow-up in 7 days with her primary care doctor for suture removal. Please keep the area covered with a bandage. Change the bandage daily. Please take ibuprofen or Tylenol as needed for discomfort as written on the box. Return to the emergency department for any signs of infection such as fever, increased redness swelling or pus to the area of the cut.
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
## 2017-07-22 DIAGNOSIS — Z6823 Body mass index (BMI) 23.0-23.9, adult: Secondary | ICD-10-CM | POA: Diagnosis not present

## 2017-07-22 DIAGNOSIS — F4310 Post-traumatic stress disorder, unspecified: Secondary | ICD-10-CM | POA: Diagnosis not present

## 2017-07-22 DIAGNOSIS — Z79899 Other long term (current) drug therapy: Secondary | ICD-10-CM | POA: Diagnosis not present

## 2017-08-19 DIAGNOSIS — J3089 Other allergic rhinitis: Secondary | ICD-10-CM | POA: Diagnosis not present

## 2017-08-19 DIAGNOSIS — Z6824 Body mass index (BMI) 24.0-24.9, adult: Secondary | ICD-10-CM | POA: Diagnosis not present

## 2017-08-19 DIAGNOSIS — J028 Acute pharyngitis due to other specified organisms: Secondary | ICD-10-CM | POA: Diagnosis not present

## 2017-09-17 DIAGNOSIS — Z6824 Body mass index (BMI) 24.0-24.9, adult: Secondary | ICD-10-CM | POA: Diagnosis not present

## 2017-09-17 DIAGNOSIS — K047 Periapical abscess without sinus: Secondary | ICD-10-CM | POA: Diagnosis not present

## 2017-09-18 ENCOUNTER — Encounter: Payer: Self-pay | Admitting: Allergy and Immunology

## 2017-09-18 ENCOUNTER — Ambulatory Visit (INDEPENDENT_AMBULATORY_CARE_PROVIDER_SITE_OTHER): Payer: Medicare Other | Admitting: Allergy and Immunology

## 2017-09-18 VITALS — BP 112/60 | HR 88 | Temp 98.8°F | Resp 20 | Ht 62.91 in | Wt 142.2 lb

## 2017-09-18 DIAGNOSIS — K219 Gastro-esophageal reflux disease without esophagitis: Secondary | ICD-10-CM

## 2017-09-18 DIAGNOSIS — G43909 Migraine, unspecified, not intractable, without status migrainosus: Secondary | ICD-10-CM

## 2017-09-18 DIAGNOSIS — J3089 Other allergic rhinitis: Secondary | ICD-10-CM | POA: Diagnosis not present

## 2017-09-18 DIAGNOSIS — F17210 Nicotine dependence, cigarettes, uncomplicated: Secondary | ICD-10-CM | POA: Diagnosis not present

## 2017-09-18 DIAGNOSIS — J4530 Mild persistent asthma, uncomplicated: Secondary | ICD-10-CM

## 2017-09-18 MED ORDER — MONTELUKAST SODIUM 10 MG PO TABS: 10.0000 mg | ORAL_TABLET | Freq: Every day | ORAL | 5 refills | Status: DC

## 2017-09-18 MED ORDER — CYPROHEPTADINE HCL 4 MG PO TABS: ORAL_TABLET | ORAL | 5 refills | Status: DC

## 2017-09-18 MED ORDER — ALBUTEROL SULFATE HFA 108 (90 BASE) MCG/ACT IN AERS: INHALATION_SPRAY | RESPIRATORY_TRACT | 1 refills | Status: DC

## 2017-09-18 MED ORDER — RANITIDINE HCL 300 MG PO TABS: 300.0000 mg | ORAL_TABLET | Freq: Every day | ORAL | 5 refills | Status: DC

## 2017-09-18 MED ORDER — FLUTICASONE PROPIONATE HFA 110 MCG/ACT IN AERO: INHALATION_SPRAY | RESPIRATORY_TRACT | 5 refills | Status: DC

## 2017-09-18 MED ORDER — PANTOPRAZOLE SODIUM 40 MG PO TBEC: 40.0000 mg | DELAYED_RELEASE_TABLET | ORAL | 5 refills | Status: DC

## 2017-09-18 NOTE — Progress Notes (Signed)
Dear [name],  Thank you for referring [name] to the [facility] [place] on 09/18/2017.   Below is a summation of this patient's evaluation and recommendations.  Thank you for your referral. I will keep you informed about this patient's response to treatment.   If you have any questions please do not hesitate to contact me.   Sincerely,  Jessica Priest, MD Allergy / Immunology [facility]   ______________________________________________________________________    NEW PATIENT NOTE  Referring Provider: Ronal Fear, NP Primary Provider: Ronal Fear, NP Date of office visit: 09/18/2017    Subjective:   Chief Complaint:  [name] (DOB: February 21, 1986) is a 31 y.o. female who presents to the clinic on 09/18/2017 with a chief complaint of Cough and Headache .     HPI: [name] presents to this clinic in evaluation of persistent respiratory tract symptoms of many years duration.  She complains of having recurrent issues with nasal itching and occasionally some sneezing and nasal congestion without any anosmia and without any ugly nasal discharge and without an obvious trigger giving rise to this issue. Recently she started Flonase which has helped this issue.  She also has problems with constant mucus in her throat and throat clearing and spitting out material from her throat. She has very severe heartburn that she describes as "horrible". She had an upper endoscopy performed in 2011 and apparently she had "sores in her esophagus". She does have regurgitation and has some breakthrough symptoms a few times per week even while using her over-the-counter Nexium. She does not consume any caffeine or chocolate.  She has recurrent issues associated with coughing and wheezing. She was given inhalers in the past when she had medical care delivered by the emergency room. She has never really been on a  controller inhaler. She believes that this issue is getting much worse and she has had a particularly bad year regarding this issue. There is no obvious provoking factor giving rise to this issue.    She smokes extensive amounts of tobacco mostly for stress reduction.  She also gets very bad headaches on a daily basis. She wakes up in the morning with a headache and it can last several hours and sometimes she develops a headache during the daytime. About one time per week she developed a very severe headache located in the back of her head that is pounding and she finds it hard to function and usually has to go back to bed. Nonsteroidal anti-inflammatory drugs do not help this type of headache. There is associated nausea but no scotoma or other neurological symptoms. She does not have a history suggesting possible sleep apnea. She does take Xanax to help her sleep at night but she has no apneic issues. She uses nonsteroidal anti-inflammatory drugs most days of the week for this headache.  Past Medical History:  Diagnosis Date  . Anxiety   . Bartholin cyst LEFT SIDE  . Bicornuate uterus S/P EXCISION RIGHT UTERUS HORN 2007  . Depression   . GERD (gastroesophageal reflux disease)    diet controlled - no meds  . Headache    sleep, xanax  . History of ovarian cyst   . PTSD (post-traumatic stress disorder)   . Schizophrenia [facility])     Past Surgical History:  Procedure Laterality Date  . APPENDECTOMY    . BARTHOLIN CYST MARSUPIALIZATION  07/18/2012  Procedure: BARTHOLIN CYST MARSUPIALIZATION;  Surgeon: Ok Edwards, MD;  Location: [facility];  Service: Gynecology;  Laterality: Left;  Left Bartholin Duct Marsupilization. One hour.   Marland Kitchen BARTHOLIN GLAND CYST EXCISION Left 02/11/2015   Procedure: BARTHOLIN GLAND EXCISION;  Surgeon: Mitchel Honour, DO;  Location: [facility];  Service: Gynecology;  Laterality: Left;  . DIAGNOSTIC LAPAROSCOPY    . LACERATION REPAIR Left 05/29/2016    Procedure: REPAIR MULTIPLE LACERATIONS;  Surgeon: Mack Hook, MD;  Location: [facility];  Service: Orthopedics;  Laterality: Left;  laceration left forearm   . LAPAROSCOPIC APPENDECTOMY AND RIGHT OVARIAN CYSTECTOMY  09-04-2004  . LAPAROSCOPIC EXCISION HYPOPLASTIC RIGHT UTERINE HORN AND RIGHT SALPINGOOPHECTOMY  03-18-2006  . TENDON REPAIR Left 05/29/2016   Procedure: TENDON REPAIR;  Surgeon: Mack Hook, MD;  Location: [facility];  Service: Orthopedics;  Laterality: Left;  . WISDOM TOOTH EXTRACTION      Allergies as of 09/18/2017      Reactions   Hydrocodone-acetaminophen Itching      Medication List      alprazolam 2 MG tablet Commonly known as:  XANAX Take 2 mg by mouth 2 (two) times daily.   CLARITIN 10 MG Caps Generic drug:  Loratadine Take 10 mg by mouth daily.   ESOMEPRAZOLE MAGNESIUM PO Take by mouth daily.   FLONASE SENSIMIST 27.5 MCG/SPRAY nasal spray Generic drug:  fluticasone Using 2 sprays in each nostril once daily.         Review of systems negative except as noted in HPI / PMHx or noted below:  Review of Systems  Constitutional: Negative.   HENT: Negative.   Eyes: Negative.   Respiratory: Negative.   Cardiovascular: Negative.   Gastrointestinal: Negative.   Genitourinary: Negative.   Musculoskeletal: Negative.   Skin: Negative.   Neurological: Negative.   Endo/Heme/Allergies: Negative.   Psychiatric/Behavioral: Negative.     Family History  Problem Relation Age of Onset  . Diabetes Mother   . Hyperlipidemia Mother   . Bipolar disorder Mother   . Depression Mother   . Hypertension Maternal Grandmother   . Heart disease Maternal Grandmother 60  . Diabetes Maternal Grandfather   . Hypertension Maternal Grandfather   . Hyperlipidemia Maternal Grandfather   . Alcohol abuse Maternal Grandfather   . Breast cancer Paternal Grandmother   . Cancer Paternal Grandmother        breast  . Alcohol abuse Father   .  Heart disease Father        not sure what it is  . Cancer - Colon Paternal Grandfather   . Drug abuse Maternal Aunt   . Schizophrenia Cousin     Social History   Social History  . Marital status: Single    Spouse name: N/A  . Number of children: N/A  . Years of education: N/A   Occupational History  . Not on file.   Social History Main Topics  . Smoking status: Current Every Day Smoker    Packs/day: 1.00    Years: 16.00    Types: Cigarettes  . Smokeless tobacco: Never Used  . Alcohol use 3.0 oz/week    5 Cans of beer per week     Comment: seldom  . Drug use: No     Comment: crack  quit 06-10-15  . Sexual activity: Yes    Birth control/ protection: None   Other Topics Concern  . Not on file   Social History Narrative  . No narrative  on file    Environmental and Social history  Lives in a house with a dry environment, no animals located inside the household, carpeting in the bedroom, no plastic on the bed, no plastic on the pillow, and actually smoking tobacco products.  Objective:   Vitals:   09/18/17 1424  BP: 112/60  Pulse: 88  Resp: 20  Temp: 98.8 F (37.1 C)   Height: 5' 2.91" (159.8 cm) Weight: 142 lb 3.2 oz (64.5 kg)  Physical Exam  Constitutional: She is well-developed, well-nourished, and in no distress.  Throat clearing, slight cough  HENT:  Head: Normocephalic. Head is without right periorbital erythema and without left periorbital erythema.  Right Ear: Tympanic membrane, external ear and ear canal normal.  Left Ear: Tympanic membrane, external ear and ear canal normal.  Nose: Nose normal. No mucosal edema or rhinorrhea.  Mouth/Throat: Uvula is midline, oropharynx is clear and moist and mucous membranes are normal. No oropharyngeal exudate.  Eyes: Pupils are equal, round, and reactive to light. Conjunctivae and lids are normal.  Neck: Trachea normal. No tracheal tenderness present. No tracheal deviation present. No thyromegaly present.    Cardiovascular: Normal rate, regular rhythm, S1 normal, S2 normal and normal heart sounds.   No murmur heard. Pulmonary/Chest: Effort normal and breath sounds normal. No stridor. No tachypnea. No respiratory distress. She has no wheezes. She has no rales. She exhibits no tenderness.  Abdominal: Soft. She exhibits no distension and no mass. There is no hepatosplenomegaly. There is no tenderness. There is no rebound and no guarding.  Musculoskeletal: She exhibits no edema or tenderness.  Lymphadenopathy:       Head (right side): No tonsillar adenopathy present.       Head (left side): No tonsillar adenopathy present.    She has no cervical adenopathy.    She has no axillary adenopathy.  Neurological: She is alert. Gait normal.  Skin: No rash noted. She is not diaphoretic. No erythema. No pallor. Nails show no clubbing.  Psychiatric: Mood and affect normal.    Diagnostics: Allergy skin tests were performed. She demonstrated hypersensitivity to house dust mite, cat, and dog  Spirometry was performed and demonstrated an FEV1 of 2.40 @ 78 % of predicted. Following the administration of nebulized albuterol her FEV1 rose to 2.70 each was an increase of 13% in her FEV1  Assessment and Plan:    1. Not well controlled mild persistent asthma   2. Other allergic rhinitis   3. LPRD (laryngopharyngeal reflux disease)   4. Heavy tobacco smoker >10 cigarettes per day   5. Migraine syndrome     1. Allergen avoidance measures. Use nicotine substitutes to replace tobacco smoke. Taper off all analgesic medications.  2. Treat and prevent inflammation:   A. Flovent 110 - two inhalations two times per day with spacer  B. Flonase - one spray each nostril 2 times per day  C. montelukast 10 mg - one tablet one time per day  3. Treat and prevent reflux:   A. pantoprazole 40 mg one tablet in a.m.  B. ranitidine 300 mg one tablet in PM  4. Treat and prevent headaches:   A. cyproheptadine 4 mg one  tablet at bedtime  5. If needed:   A. nasal saline wash  B. Claritin 10 mg tablet 1 time per day  C. ProAir HFA 2 puffs every 4-6 hours  6. Return to clinic in 3 weeks or earlier if problem  7. Obtain fall flu vaccine  Dhiya will  utilize anti-inflammatory medications for her respiratory tract to address the atopic inflammation present in this organ system and she needs to eliminate her smoke exposure and I had a talk with her today about techniques that she can use to eliminate her cigarette smoking. She also appears to have rather significant reflux with a component of LPR for which she will use a combination of a proton pump inhibitor and an H2 receptor blocker. She has chronic daily headaches and migraines for which we will give her cyproheptadine at nighttime and have her taper off her analgesic medications. I will regroup with her in 3 weeks or earlier if there is a problem.  Jessica Priest, MD Allergy / Immunology [facility]

## 2017-09-18 NOTE — Patient Instructions (Addendum)
  1. Allergen avoidance measures. Use nicotine substitutes to replace tobacco smoke. Taper off all analgesic medications  2. Treat and prevent inflammation:   A. Flovent 110 - two inhalations two times per day with spacer  B. Flonase - one spray each nostril 2 times per day  C. montelukast 10 mg - one tablet one time per day  3. Treat and prevent reflux:   A. pantoprazole 40 mg one tablet in a.m.  B. ranitidine 300 mg one tablet in PM  4. Treat and prevent headaches:   A. cyproheptadine 4 mg one tablet at bedtime  5. If needed:   A. nasal saline wash  B. Claritin 10 mg tablet 1 time per day  C. ProAir HFA 2 puffs every 4-6 hours  6. Return to clinic in 3 weeks or earlier if problem  7. Obtain fall flu vaccine

## 2017-10-07 ENCOUNTER — Encounter: Payer: Self-pay | Admitting: Allergy and Immunology

## 2017-10-07 ENCOUNTER — Ambulatory Visit (INDEPENDENT_AMBULATORY_CARE_PROVIDER_SITE_OTHER): Payer: Medicare Other | Admitting: Allergy and Immunology

## 2017-10-07 VITALS — BP 118/74 | HR 84 | Resp 20

## 2017-10-07 DIAGNOSIS — J4530 Mild persistent asthma, uncomplicated: Secondary | ICD-10-CM

## 2017-10-07 DIAGNOSIS — G43909 Migraine, unspecified, not intractable, without status migrainosus: Secondary | ICD-10-CM

## 2017-10-07 DIAGNOSIS — J3089 Other allergic rhinitis: Secondary | ICD-10-CM

## 2017-10-07 DIAGNOSIS — K219 Gastro-esophageal reflux disease without esophagitis: Secondary | ICD-10-CM

## 2017-10-07 DIAGNOSIS — F17210 Nicotine dependence, cigarettes, uncomplicated: Secondary | ICD-10-CM | POA: Diagnosis not present

## 2017-10-07 MED ORDER — BUDESONIDE-FORMOTEROL FUMARATE 160-4.5 MCG/ACT IN AERO: INHALATION_SPRAY | RESPIRATORY_TRACT | 5 refills | Status: DC

## 2017-10-07 NOTE — Patient Instructions (Addendum)
  1. Allergen avoidance measures. Continue to attempt nicotine substitutes to replace tobacco smoke.    2. Continue toTreat and prevent inflammation:   A. SYMBICORT 160 - two inhalations two times per day with spacer  B. Flonase - one spray each nostril 2 times per day  C. montelukast 10 mg - one tablet one time per day  3. Continue to Treat and prevent reflux:   A. pantoprazole 40 mg one tablet in a.m.  B. ranitidine 300 mg one tablet in PM  4. Continue to Treat and prevent headaches:   A. cyproheptadine 4 mg one tablet at bedtime  5. If needed:   A. nasal saline wash  B. Claritin 10 mg tablet 1 time per day  C. ProAir HFA 2 puffs every 4-6 hours  6. Return to clinic in 9 weeks or earlier if problem  7. Obtain fall flu vaccine

## 2017-10-07 NOTE — Progress Notes (Signed)
Follow-up Note  Referring Provider: Ronal Fear, NP Primary Provider: Ronal Fear, NP Date of Office Visit: 10/07/2017  Subjective:   [name] (DOB: 01-16-1986) is a 31 y.o. female who [facility] on 10[facility]07/2017 in re-evaluation of the following:  HPI: [name] returns to this clinic in reevaluation of her asthma and allergic rhinoconjunctivitis and LPR and chronic cephalgia and tobacco use. Her last visit to this clinic was her initial evaluation of 09/18/2017.  She is much better regarding her headaches. She has gone from headaches on a daily basis to none. She continues on cyproheptadine.  She has had no issues with her upper airways.  She continues to have coughing and wheezing and she uses a bronchodilator at least one time per day which does help her symptoms. She continues to have some throat clearing and some postnasal drip. She has no classic reflux symptoms at this point.  She still continues to smoke. She has cut back somewhat but is still around a pack a day.  Allergies as of 10/07/2017      Reactions   Hydrocodone-acetaminophen Itching      Medication List      albuterol 108 (90 Base) MCG/ACT inhaler Commonly known as:  PROAIR HFA Inhale two puffs every four to six hours as needed for cough or wheeze.   alprazolam 2 MG tablet Commonly known as:  XANAX Take 2 mg by mouth 2 (two) times daily.   CLARITIN 10 MG Caps Generic drug:  Loratadine Take 10 mg by mouth daily.   cyproheptadine 4 MG tablet Commonly known as:  PERIACTIN Take one tablet by mouth at bedtime as directed.   fluticasone 110 MCG/ACT inhaler Commonly known as:  FLOVENT HFA Inhale two puffs twice daily to prevent cough or wheeze.  Use with a spacer.  Rinse, gargle, and spit after use.   fluticasone 50 MCG/ACT nasal spray Commonly known as:  FLONASE Place 2 sprays into both nostrils daily.   montelukast 10 MG tablet Commonly known as:  SINGULAIR Take 1  tablet (10 mg total) by mouth daily.   pantoprazole 40 MG tablet Commonly known as:  PROTONIX Take 1 tablet (40 mg total) by mouth every morning.   ranitidine 300 MG tablet Commonly known as:  ZANTAC Take 1 tablet (300 mg total) by mouth at bedtime.       Past Medical History:  Diagnosis Date  . Anxiety   . Bartholin cyst LEFT SIDE  . Bicornuate uterus S/P EXCISION RIGHT UTERUS HORN 2007  . Depression   . GERD (gastroesophageal reflux disease)    diet controlled - no meds  . Headache    sleep, xanax  . History of ovarian cyst   . PTSD (post-traumatic stress disorder)   . Schizophrenia [facility])     Past Surgical History:  Procedure Laterality Date  . APPENDECTOMY    . BARTHOLIN CYST MARSUPIALIZATION  07/18/2012   Procedure: BARTHOLIN CYST MARSUPIALIZATION;  Surgeon: Ok Edwards, MD;  Location: [facility];  Service: Gynecology;  Laterality: Left;  Left Bartholin Duct Marsupilization. One hour.   Marland Kitchen BARTHOLIN GLAND CYST EXCISION Left 02/11/2015   Procedure: BARTHOLIN GLAND EXCISION;  Surgeon: Mitchel Honour, DO;  Location: [facility];  Service: Gynecology;  Laterality: Left;  . DIAGNOSTIC LAPAROSCOPY    . LACERATION REPAIR Left 05/29/2016   Procedure: REPAIR MULTIPLE LACERATIONS;  Surgeon: Mack Hook, MD;  Location: [facility];  Service: Orthopedics;  Laterality: Left;  laceration left forearm   . LAPAROSCOPIC APPENDECTOMY AND RIGHT OVARIAN CYSTECTOMY  09-04-2004  . LAPAROSCOPIC EXCISION HYPOPLASTIC RIGHT UTERINE HORN AND RIGHT SALPINGOOPHECTOMY  03-18-2006  . TENDON REPAIR Left 05/29/2016   Procedure: TENDON REPAIR;  Surgeon: Mack Hook, MD;  [facility];  Service: Orthopedics;  Laterality: Left;  . WISDOM TOOTH EXTRACTION      Review of systems negative except as noted in HPI / PMHx or noted below:  Review of Systems  Constitutional: Negative.   HENT: Negative.   Eyes: Negative.   Respiratory: Negative.     Cardiovascular: Negative.   Gastrointestinal: Negative.   Genitourinary: Negative.   Musculoskeletal: Negative.   Skin: Negative.   Neurological: Negative.   Endo/Heme/Allergies: Negative.   Psychiatric/Behavioral: Negative.      Objective:   Vitals:   10/07/17 1748  BP: 118/74  Pulse: 84  Resp: 20          Physical Exam  Constitutional: She is well-developed, well-nourished, and in no distress.  HENT:  Head: Normocephalic.  Right Ear: Tympanic membrane, external ear and ear canal normal.  Left Ear: Tympanic membrane, external ear and ear canal normal.  Nose: Nose normal. No mucosal edema or rhinorrhea.  Mouth/Throat: Uvula is midline, oropharynx is clear and moist and mucous membranes are normal. No oropharyngeal exudate.  Eyes: Conjunctivae are normal.  Neck: Trachea normal. No tracheal tenderness present. No tracheal deviation present. No thyromegaly present.  Cardiovascular: Normal rate, regular rhythm, S1 normal, S2 normal and normal heart sounds.   No murmur heard. Pulmonary/Chest: Breath sounds normal. No stridor. No respiratory distress. She has no wheezes. She has no rales.  Musculoskeletal: She exhibits no edema.  Lymphadenopathy:       Head (right side): No tonsillar adenopathy present.       Head (left side): No tonsillar adenopathy present.    She has no cervical adenopathy.  Neurological: She is alert. Gait normal.  Skin: No rash noted. She is not diaphoretic. No erythema. Nails show no clubbing.  Psychiatric: Mood and affect normal.    Diagnostics:    Spirometry was performed and demonstrated an FEV1 of 2.42 at 79 % of predicted.  The patient had an Asthma Control Test with the following results: ACT Total Score: 12.    Assessment and Plan:   1. Not well controlled mild persistent asthma   2. Other allergic rhinitis   3. LPRD (laryngopharyngeal reflux disease)   4. Heavy tobacco smoker >10 cigarettes per day   5. Migraine syndrome     1.  Allergen avoidance measures. Continue to attempt nicotine substitutes to replace tobacco smoke.    2. Continue toTreat and prevent inflammation:   A. SYMBICORT 160 - two inhalations two times per day with spacer  B. Flonase - one spray each nostril 2 times per day  C. montelukast 10 mg - one tablet one time per day  3. Continue to Treat and prevent reflux:   A. pantoprazole 40 mg one tablet in a.m.  B. ranitidine 300 mg one tablet in PM  4. Continue to Treat and prevent headaches:   A. cyproheptadine 4 mg one tablet at bedtime  5. If needed:   A. nasal saline wash  B. Claritin 10 mg tablet 1 time per day  C. ProAir HFA 2 puffs every 4-6 hours  6. Return to clinic in 9 weeks or earlier if problem  7. Obtain fall flu vaccine  [name] appears to be  better regarding her chronic cephalgia and she will continue on cyproheptadine. Her reflux also appears to be doing relatively well on her current plan. Her respiratory tract inflammation is still present and we will treat her with the combination therapy noted above. I will see her back in this clinic in 9 weeks which will be a total of 12 weeks of therapy and we will make a decision about how to proceed pending her response at that point.  Laurette Schimke, MD Allergy / Immunology [facility]

## 2017-10-08 DIAGNOSIS — Z6824 Body mass index (BMI) 24.0-24.9, adult: Secondary | ICD-10-CM | POA: Diagnosis not present

## 2017-10-08 DIAGNOSIS — Z79899 Other long term (current) drug therapy: Secondary | ICD-10-CM | POA: Diagnosis not present

## 2017-10-08 DIAGNOSIS — F4310 Post-traumatic stress disorder, unspecified: Secondary | ICD-10-CM | POA: Diagnosis not present

## 2017-10-08 DIAGNOSIS — Z23 Encounter for immunization: Secondary | ICD-10-CM | POA: Diagnosis not present

## 2017-11-18 DIAGNOSIS — L0291 Cutaneous abscess, unspecified: Secondary | ICD-10-CM | POA: Diagnosis not present

## 2017-11-18 DIAGNOSIS — Z6825 Body mass index (BMI) 25.0-25.9, adult: Secondary | ICD-10-CM | POA: Diagnosis not present

## 2017-12-09 ENCOUNTER — Ambulatory Visit: Payer: Medicare Other | Admitting: Allergy and Immunology

## 2017-12-16 DIAGNOSIS — M26629 Arthralgia of temporomandibular joint, unspecified side: Secondary | ICD-10-CM | POA: Diagnosis not present

## 2017-12-16 DIAGNOSIS — Z6825 Body mass index (BMI) 25.0-25.9, adult: Secondary | ICD-10-CM | POA: Diagnosis not present

## 2017-12-19 NOTE — Therapy (Signed)
[facility] [address] [place], [place], [address] Phone: [phone]   Fax:  [phone]  Patient Details  Name: [name] MRN: [id number] Date of Birth: Nov 01, 1986 Referring Provider:  Dr. Milly Jakob Encounter Date: 12/19/2017  OCCUPATIONAL THERAPY DISCHARGE SUMMARY  Visits from Start of Care: 3  Current functional level related to goals / functional outcomes: OT Short Term Goals - 06/07/16 1032      OT SHORT TERM GOAL #1   Title  Independent with splint wear and care    Baseline  issued, may need adjustments    Time  4    Period  Weeks    Status  met     OT SHORT TERM GOAL #2   Title  Independent with initial HEP     Baseline  issued initial ex's, will need updates    Time  4    Status  met     Pt did not meet any LTG's secondary to not returning after 3rd visit on 06/27/17.    Remaining deficits: Unknown d/t pt not returning after 3rd visit   Education / Equipment: Splint wear and care, HEP's  Plan:                                                    Patient goals were partially met. Patient is being discharged due to not returning since the last visit.  ?????       Carey Bullocks, OTR/L 12/19/2017, 8:14 AM  [facility] [address] [place], [place], [address] Phone: [phone]   Fax:  [phone]

## 2018-01-09 DIAGNOSIS — F29 Unspecified psychosis not due to a substance or known physiological condition: Secondary | ICD-10-CM | POA: Diagnosis not present

## 2018-01-09 DIAGNOSIS — F4310 Post-traumatic stress disorder, unspecified: Secondary | ICD-10-CM | POA: Diagnosis not present

## 2018-01-09 DIAGNOSIS — F329 Major depressive disorder, single episode, unspecified: Secondary | ICD-10-CM | POA: Diagnosis not present

## 2018-01-09 DIAGNOSIS — F4481 Dissociative identity disorder: Secondary | ICD-10-CM | POA: Diagnosis not present

## 2018-01-09 DIAGNOSIS — Z6825 Body mass index (BMI) 25.0-25.9, adult: Secondary | ICD-10-CM | POA: Diagnosis not present

## 2018-01-27 ENCOUNTER — Ambulatory Visit (INDEPENDENT_AMBULATORY_CARE_PROVIDER_SITE_OTHER): Payer: Medicare Other | Admitting: Allergy and Immunology

## 2018-01-27 ENCOUNTER — Encounter: Payer: Self-pay | Admitting: Allergy and Immunology

## 2018-01-27 VITALS — BP 142/84 | HR 88 | Resp 20

## 2018-01-27 DIAGNOSIS — J4541 Moderate persistent asthma with (acute) exacerbation: Secondary | ICD-10-CM

## 2018-01-27 DIAGNOSIS — F17210 Nicotine dependence, cigarettes, uncomplicated: Secondary | ICD-10-CM | POA: Diagnosis not present

## 2018-01-27 DIAGNOSIS — G43909 Migraine, unspecified, not intractable, without status migrainosus: Secondary | ICD-10-CM | POA: Diagnosis not present

## 2018-01-27 DIAGNOSIS — J3089 Other allergic rhinitis: Secondary | ICD-10-CM

## 2018-01-27 DIAGNOSIS — K219 Gastro-esophageal reflux disease without esophagitis: Secondary | ICD-10-CM | POA: Diagnosis not present

## 2018-01-27 MED ORDER — FLUTICASONE-SALMETEROL 230-21 MCG/ACT IN AERO: INHALATION_SPRAY | RESPIRATORY_TRACT | 5 refills | Status: DC

## 2018-01-27 MED ORDER — AMOXICILLIN-POT CLAVULANATE 875-125 MG PO TABS: 1.0000 | ORAL_TABLET | Freq: Two times a day (BID) | ORAL | 0 refills | Status: DC

## 2018-01-27 MED ORDER — ALBUTEROL SULFATE HFA 108 (90 BASE) MCG/ACT IN AERS: INHALATION_SPRAY | RESPIRATORY_TRACT | 1 refills | Status: DC

## 2018-01-27 NOTE — Progress Notes (Signed)
Follow-up Note  Referring Provider: Ronal Fear, NP Primary Provider: Ronal Fear, NP Date of Office Visit: 01/27/2018  Subjective:   [name] (DOB: 31-Dec-1986) is a 32 y.o. female [facility] in re-evaluation of the following:  HPI: [name] returns to this clinic in reevaluation of asthma and allergic rhinoconjunctivitis and LPR and chronic cephalgia and tobacco use.  Her last visit to this clinic was 07 October 2017.  During her last visit we gave her Symbicort to utilize in place of an inhaled steroid and although this has helped her somewhat with her wheezing she still requires a bronchodilator on a regular basis.  Unfortunately she still continues to smoke at a rate of 1 pack/day.  She has not required a systemic steroid to treat an exacerbation of her asthma since her last visit.  2 weeks ago she developed a fever and headache and sneezing and head pressure and then wheezing and coughing.  Although her fever has resolved she still has very significant head fullness and anosmia and headache and coughing and wheezing.  She still continues to have some headaches even though they are much better with the use of cyproheptadine.  She still continues to drink high concentrations of caffeine throughout the day.  Her reflux has been under excellent control at this point in time and she has not really been having any problems with her throat other than her recent acute illness.  She did obtain a flu vaccine this year.  Allergies as of 01/27/2018      Reactions   Hydrocodone-acetaminophen Itching      Medication List      albuterol 108 (90 Base) MCG/ACT inhaler Commonly known as:  PROAIR HFA Inhale two puffs every four to six hours as needed for cough or wheeze.   albuterol 108 (90 Base) MCG/ACT inhaler Commonly known as:  VENTOLIN HFA Inhale two puffs every four to six hours as needed for cough or wheeze.   alprazolam 2 MG  tablet Commonly known as:  XANAX Take 2 mg by mouth 2 (two) times daily.   amoxicillin-clavulanate 875-125 MG tablet Commonly known as:  AUGMENTIN Take 1 tablet by mouth 2 (two) times daily.   budesonide-formoterol 160-4.5 MCG/ACT inhaler Commonly known as:  SYMBICORT Inhale two puffs twice daily with spacer to prevent cough or wheeze.  Rinse, gargle, and spit after use.   cyproheptadine 4 MG tablet Commonly known as:  PERIACTIN Take one tablet by mouth at bedtime as directed.   fluticasone 110 MCG/ACT inhaler Commonly known as:  FLOVENT HFA Inhale two puffs twice daily to prevent cough or wheeze.  Use with a spacer.  Rinse, gargle, and spit after use.   fluticasone 50 MCG/ACT nasal spray Commonly known as:  FLONASE Place 2 sprays into both nostrils daily.   fluticasone-salmeterol 230-21 MCG/ACT inhaler Commonly known as:  ADVAIR HFA Inhale two puffs twice daily to prevent cough or wheeze.  Rinse, gargle, and spit after use.   montelukast 10 MG tablet Commonly known as:  SINGULAIR Take 1 tablet (10 mg total) by mouth daily.   pantoprazole 40 MG tablet Commonly known as:  PROTONIX Take 1 tablet (40 mg total) by mouth every morning.   ranitidine 300 MG tablet Commonly known as:  ZANTAC Take 1 tablet (300 mg total) by mouth at bedtime.       Past Medical History:  Diagnosis Date  . Anxiety   .  Bartholin cyst LEFT SIDE  . Bicornuate uterus S/P EXCISION RIGHT UTERUS HORN 2007  . Depression   . GERD (gastroesophageal reflux disease)    diet controlled - no meds  . Headache    sleep, xanax  . History of ovarian cyst   . PTSD (post-traumatic stress disorder)   . Schizophrenia [facility])     Past Surgical History:  Procedure Laterality Date  . APPENDECTOMY    . BARTHOLIN CYST MARSUPIALIZATION  07/18/2012   Procedure: BARTHOLIN CYST MARSUPIALIZATION;  Surgeon: Ok Edwards, MD;  Location: [facility];  Service: Gynecology;  Laterality: Left;  Left  Bartholin Duct Marsupilization. One hour.   Marland Kitchen BARTHOLIN GLAND CYST EXCISION Left 02/11/2015   Procedure: BARTHOLIN GLAND EXCISION;  Surgeon: Mitchel Honour, DO;  Location: [facility];  Service: Gynecology;  Laterality: Left;  . DIAGNOSTIC LAPAROSCOPY    . LACERATION REPAIR Left 05/29/2016   Procedure: REPAIR MULTIPLE LACERATIONS;  Surgeon: Mack Hook, MD;  Location: [facility];  Service: Orthopedics;  Laterality: Left;  laceration left forearm   . LAPAROSCOPIC APPENDECTOMY AND RIGHT OVARIAN CYSTECTOMY  09-04-2004  . LAPAROSCOPIC EXCISION HYPOPLASTIC RIGHT UTERINE HORN AND RIGHT SALPINGOOPHECTOMY  03-18-2006  . TENDON REPAIR Left 05/29/2016   Procedure: TENDON REPAIR;  Surgeon: Mack Hook, MD;  Location: [facility];  Service: Orthopedics;  Laterality: Left;  . WISDOM TOOTH EXTRACTION      Review of systems negative except as noted in HPI / PMHx or noted below:  Review of Systems  Constitutional: Negative.   HENT: Negative.   Eyes: Negative.   Respiratory: Negative.   Cardiovascular: Negative.   Gastrointestinal: Negative.   Genitourinary: Negative.   Musculoskeletal: Negative.   Skin: Negative.   Neurological: Negative.   Endo/Heme/Allergies: Negative.   Psychiatric/Behavioral: Negative.      Objective:   Vitals:   01/27/18 1741  BP: (!) 142/84  Pulse: 88  Resp: 20          Physical Exam  Constitutional: She is well-developed, well-nourished, and in no distress.  Nasal voice, coughing  HENT:  Head: Normocephalic.  Right Ear: Tympanic membrane, external ear and ear canal normal.  Left Ear: Tympanic membrane, external ear and ear canal normal.  Nose: Mucosal edema (Erythematous) present. No rhinorrhea.  Mouth/Throat: Uvula is midline, oropharynx is clear and moist and mucous membranes are normal. No oropharyngeal exudate.  Eyes: Conjunctivae are normal.  Neck: Trachea normal. No tracheal tenderness present. No tracheal deviation present.  No thyromegaly present.  Cardiovascular: Normal rate, regular rhythm, S1 normal, S2 normal and normal heart sounds.  No murmur heard. Pulmonary/Chest: No stridor. No respiratory distress. She has wheezes (Bilateral expiratory wheezing all lung fields). She has no rales.  Musculoskeletal: She exhibits no edema.  Lymphadenopathy:       Head (right side): No tonsillar adenopathy present.       Head (left side): No tonsillar adenopathy present.    She has no cervical adenopathy.  Neurological: She is alert. Gait normal.  Skin: No rash noted. She is not diaphoretic. No erythema. Nails show no clubbing.  Psychiatric: Mood and affect normal.    Diagnostics:    Spirometry was performed and demonstrated an FEV1 of 2.21 at 72 % of predicted.  The patient had an Asthma Control Test with the following results: ACT Total Score: 12.    Assessment and Plan:   1. Asthma, not well controlled, moderate persistent, with acute exacerbation   2. Other allergic rhinitis  3. LPRD (laryngopharyngeal reflux disease)   4. Heavy tobacco smoker >10 cigarettes per day   5. Migraine syndrome     1. Continue to perform Allergen avoidance measures.   2. Continue to consider nicotine substitutes to replace tobacco smoke.    3. Continue toTreat and prevent inflammation:   A. ADVAIR 230 - two inhalations two times per day with spacer  B. Flonase - one spray each nostril 2 times per day  C. montelukast 10 mg - one tablet one time per day  4. Continue to Treat and prevent reflux:   A. pantoprazole 40 mg one tablet in a.m.  B. ranitidine 300 mg one tablet in PM  5. Continue to Treat and prevent headaches:   A. cyproheptadine 4 mg one tablet at bedtime  B. Consolidate and attempt to eliminate caffeine / chocolate  6. If needed:   A. nasal saline wash  B. Claritin 10 mg tablet 1 time per day  C. ProAir HFA 2 puffs every 4-6 hours  7. For most recent episode:   A. Augmentin 875 one tablet two times  a day for 14 days  B. Prednisone 10mg  one tablet one time per day for 10 days.  8. Return to clinic in 12 weeks or earlier if problem  I will treat Airlie for an apparent episode of sinusitis giving rise to significant inflammation of her respiratory tract with the medical therapy noted above.  It does not appear as though the use of Symbicort was adequate in controlling her chronic lower airway symptoms and we will now try her on high-dose Advair.  If she does not have a good response to this therapy she may be a candidate for a biological agent.  She needs to stop drinking high levels of caffeine otherwise she is never going to get rid of her chronic cephalgia even though this has improved with cyproheptadine.  I will see her back in this clinic in 12 weeks or earlier if there is a problem.  Laurette Schimke, MD Allergy / Immunology [facility]

## 2018-01-27 NOTE — Patient Instructions (Addendum)
  1. Continue to perform Allergen avoidance measures.   2. Continue to consider nicotine substitutes to replace tobacco smoke.    3. Continue toTreat and prevent inflammation:   A. ADVAIR 230 - two inhalations two times per day with spacer  B. Flonase - one spray each nostril 2 times per day  C. montelukast 10 mg - one tablet one time per day  4. Continue to Treat and prevent reflux:   A. pantoprazole 40 mg one tablet in a.m.  B. ranitidine 300 mg one tablet in PM  5. Continue to Treat and prevent headaches:   A. cyproheptadine 4 mg one tablet at bedtime  B. Consolidate and attempt to eliminate caffeine / chocolate  6. If needed:   A. nasal saline wash  B. Claritin 10 mg tablet 1 time per day  C. ProAir HFA 2 puffs every 4-6 hours  7. For most recent episode:   A. Augmentin 875 one tablet two times a day for 14 days  B. Prednisone 10mg  one tablet one time per day for 10 days.  8. Return to clinic in 12 weeks or earlier if problem

## 2018-01-28 ENCOUNTER — Other Ambulatory Visit: Payer: Self-pay

## 2018-01-28 ENCOUNTER — Telehealth: Payer: Self-pay | Admitting: Allergy and Immunology

## 2018-01-28 ENCOUNTER — Encounter: Payer: Self-pay | Admitting: Allergy and Immunology

## 2018-01-28 MED ORDER — BECLOMETHASONE DIPROP HFA 80 MCG/ACT IN AERB: INHALATION_SPRAY | RESPIRATORY_TRACT | 5 refills | Status: DC

## 2018-01-28 NOTE — Telephone Encounter (Signed)
Patient informed and Rx sent.  

## 2018-01-28 NOTE — Telephone Encounter (Signed)
[name] called in and stated that when she went to pick up ADVAIR last night, the pharmacy informed her the insurance wouldn't cover it.  [name] would like a cheaper alternate to ADVAIR.  Please advise.

## 2018-01-28 NOTE — Telephone Encounter (Signed)
Please inform patient that she can continue on Symbicort twice a day PLUS add Qvar 80 Redihaler 2 inhalations two times per day (without spacer) instead of using Advair.

## 2018-01-29 DIAGNOSIS — J209 Acute bronchitis, unspecified: Secondary | ICD-10-CM | POA: Diagnosis not present

## 2018-01-29 DIAGNOSIS — J40 Bronchitis, not specified as acute or chronic: Secondary | ICD-10-CM | POA: Diagnosis not present

## 2018-01-29 DIAGNOSIS — R509 Fever, unspecified: Secondary | ICD-10-CM | POA: Diagnosis not present

## 2018-01-29 DIAGNOSIS — F17210 Nicotine dependence, cigarettes, uncomplicated: Secondary | ICD-10-CM | POA: Diagnosis not present

## 2018-01-29 DIAGNOSIS — R062 Wheezing: Secondary | ICD-10-CM | POA: Diagnosis not present

## 2018-01-29 DIAGNOSIS — R05 Cough: Secondary | ICD-10-CM | POA: Diagnosis not present

## 2018-01-29 DIAGNOSIS — J449 Chronic obstructive pulmonary disease, unspecified: Secondary | ICD-10-CM | POA: Diagnosis not present

## 2018-01-30 ENCOUNTER — Other Ambulatory Visit: Payer: Self-pay

## 2018-01-30 MED ORDER — FLUTICASONE FUROATE 200 MCG/ACT IN AEPB: 1.0000 | INHALATION_SPRAY | Freq: Every day | RESPIRATORY_TRACT | 3 refills | Status: DC

## 2018-02-03 ENCOUNTER — Ambulatory Visit (INDEPENDENT_AMBULATORY_CARE_PROVIDER_SITE_OTHER): Payer: Medicare Other | Admitting: Allergy and Immunology

## 2018-02-03 ENCOUNTER — Encounter: Payer: Self-pay | Admitting: Allergy and Immunology

## 2018-02-03 VITALS — BP 102/68 | HR 108 | Temp 99.1°F | Resp 20

## 2018-02-03 DIAGNOSIS — F17210 Nicotine dependence, cigarettes, uncomplicated: Secondary | ICD-10-CM | POA: Diagnosis not present

## 2018-02-03 DIAGNOSIS — J4541 Moderate persistent asthma with (acute) exacerbation: Secondary | ICD-10-CM | POA: Diagnosis not present

## 2018-02-03 DIAGNOSIS — K219 Gastro-esophageal reflux disease without esophagitis: Secondary | ICD-10-CM | POA: Diagnosis not present

## 2018-02-03 DIAGNOSIS — J3089 Other allergic rhinitis: Secondary | ICD-10-CM | POA: Diagnosis not present

## 2018-02-03 DIAGNOSIS — G43909 Migraine, unspecified, not intractable, without status migrainosus: Secondary | ICD-10-CM | POA: Diagnosis not present

## 2018-02-03 MED ORDER — AZITHROMYCIN 500 MG PO TABS: 500.0000 mg | ORAL_TABLET | Freq: Every day | ORAL | 0 refills | Status: AC

## 2018-02-03 MED ORDER — OSELTAMIVIR PHOSPHATE 75 MG PO CAPS: 75.0000 mg | ORAL_CAPSULE | Freq: Two times a day (BID) | ORAL | 0 refills | Status: AC

## 2018-02-03 MED ORDER — BUDESONIDE-FORMOTEROL FUMARATE 160-4.5 MCG/ACT IN AERO: INHALATION_SPRAY | RESPIRATORY_TRACT | 5 refills | Status: DC

## 2018-02-03 MED ORDER — METHYLPREDNISOLONE ACETATE 80 MG/ML IJ SUSP
80.0000 mg | Freq: Once | INTRAMUSCULAR | Status: AC
  Administered 2018-02-03: 80 mg via INTRAMUSCULAR

## 2018-02-03 MED ORDER — FLUTICASONE PROPIONATE HFA 110 MCG/ACT IN AERO: INHALATION_SPRAY | RESPIRATORY_TRACT | 5 refills | Status: DC

## 2018-02-03 NOTE — Progress Notes (Signed)
Follow-up Note  Referring Provider: Ronal Fear, NP Primary Provider: Ronal Fear, NP Date of Office Visit: 02/03/2018  Subjective:   [name] (DOB: November 08, 1986) is a 32 y.o. female who [facility]2018 in re-evaluation of the following:  HPI: [name] presents to this clinic in reevaluation of her asthma and allergic rhinoconjunctivitis and LPR and chronic cephalgia and tobacco use.  Her last visit to this clinic was 27 January 2018 at which point in time she was not doing very well regarding most of these issues and indeed appeared to have developed a problem with fever and headaches and sneezing and head pressure for which we gave her a short course of low-dose systemic steroids and broad-spectrum antibiotics with the use of Augmentin.  She unfortunately has done very bad since that last visit and ended up in the emergency room about 6 days ago and had a chest x-ray which apparently identified no significant abnormality.  She was not really given any additional therapy at that point in time but she has progressed once again and this weekend she had chills and body aches and a temperature of 102.  In addition, she had some diarrhea that developed for several days and also had an episode of vomiting on Saturday of this weekend.  She still continues to smoke and drink large amounts of caffeine and still continues to have all of her respiratory tract symptoms addressed during her last visit.  Allergies as of 02/03/2018      Reactions   Hydrocodone-acetaminophen Itching      Medication List      albuterol 108 (90 Base) MCG/ACT inhaler Commonly known as:  PROAIR HFA Inhale two puffs every four to six hours as needed for cough or wheeze.   albuterol 108 (90 Base) MCG/ACT inhaler Commonly known as:  VENTOLIN HFA Inhale two puffs every four to six hours as needed for cough or wheeze.   alprazolam 2 MG tablet Commonly known as:  XANAX Take 2 mg by  mouth 2 (two) times daily.   budesonide-formoterol 160-4.5 MCG/ACT inhaler Commonly known as:  SYMBICORT Inhale two puffs twice daily with spacer to prevent cough or wheeze.  Rinse, gargle, and spit after use.   cyproheptadine 4 MG tablet Commonly known as:  PERIACTIN Take one tablet by mouth at bedtime as directed.   fluticasone 110 MCG/ACT inhaler Commonly known as:  FLOVENT HFA Inhale two puffs twice daily to prevent cough or wheeze.  Use with a spacer.  Rinse, gargle, and spit after use.   fluticasone 50 MCG/ACT nasal spray Commonly known as:  FLONASE Place 2 sprays into both nostrils daily.   montelukast 10 MG tablet Commonly known as:  SINGULAIR Take 1 tablet (10 mg total) by mouth daily.   pantoprazole 40 MG tablet Commonly known as:  PROTONIX Take 1 tablet (40 mg total) by mouth every morning.   ranitidine 300 MG tablet Commonly known as:  ZANTAC Take 1 tablet (300 mg total) by mouth at bedtime.       Past Medical History:  Diagnosis Date  . Anxiety   . Bartholin cyst LEFT SIDE  . Bicornuate uterus S/P EXCISION RIGHT UTERUS HORN 2007  . Depression   . GERD (gastroesophageal reflux disease)    diet controlled - no meds  . Headache    sleep, xanax  . History of ovarian cyst   . PTSD (post-traumatic stress disorder)   . Schizophrenia (  [facility])     Past Surgical History:  Procedure Laterality Date  . APPENDECTOMY    . BARTHOLIN CYST MARSUPIALIZATION  07/18/2012   Procedure: BARTHOLIN CYST MARSUPIALIZATION;  Surgeon: Ok Edwards, MD;  Location: [facility];  Service: Gynecology;  Laterality: Left;  Left Bartholin Duct Marsupilization. One hour.   Marland Kitchen BARTHOLIN GLAND CYST EXCISION Left 02/11/2015   Procedure: BARTHOLIN GLAND EXCISION;  Surgeon: Mitchel Honour, DO;  Location: [facility];  Service: Gynecology;  Laterality: Left;  . DIAGNOSTIC LAPAROSCOPY    . LACERATION REPAIR Left 05/29/2016   Procedure: REPAIR MULTIPLE LACERATIONS;  Surgeon: Mack Hook, MD;  Location: [facility];  Service: Orthopedics;  Laterality: Left;  laceration left forearm   . LAPAROSCOPIC APPENDECTOMY AND RIGHT OVARIAN CYSTECTOMY  09-04-2004  . LAPAROSCOPIC EXCISION HYPOPLASTIC RIGHT UTERINE HORN AND RIGHT SALPINGOOPHECTOMY  03-18-2006  . TENDON REPAIR Left 05/29/2016   Procedure: TENDON REPAIR;  Surgeon: Mack Hook, MD;  Location: [facility];  Service: Orthopedics;  Laterality: Left;  . WISDOM TOOTH EXTRACTION      Review of systems negative except as noted in HPI / PMHx or noted below:  Review of Systems  Constitutional: Negative.   HENT: Negative.   Eyes: Negative.   Respiratory: Negative.   Cardiovascular: Negative.   Gastrointestinal: Negative.   Genitourinary: Negative.   Musculoskeletal: Negative.   Skin: Negative.   Neurological: Negative.   Endo/Heme/Allergies: Negative.   Psychiatric/Behavioral: Negative.      Objective:   Vitals:   02/03/18 1345  BP: 102/68  Pulse: (!) 108  Resp: 20  Temp: 99.1 F (37.3 C)  SpO2: 98%          Physical Exam  Constitutional: She is well-developed, well-nourished, and in no distress.  Tachypneic  HENT:  Head: Normocephalic.  Right Ear: Tympanic membrane, external ear and ear canal normal.  Left Ear: Tympanic membrane, external ear and ear canal normal.  Nose: Nose normal. No mucosal edema or rhinorrhea.  Mouth/Throat: Uvula is midline and mucous membranes are normal. Posterior oropharyngeal erythema present. No oropharyngeal exudate.  Eyes: Conjunctivae are normal.  Neck: Trachea normal. No tracheal tenderness present. No tracheal deviation present. No thyromegaly present.  Cardiovascular: Normal rate, regular rhythm, S1 normal, S2 normal and normal heart sounds.  No murmur heard. Pulmonary/Chest: Breath sounds normal. No stridor. No respiratory distress. She has no wheezes (Bilateral expiratory wheezes all lung fields). She has no rales.    Musculoskeletal: She exhibits no edema.  Lymphadenopathy:       Head (right side): No tonsillar adenopathy present.       Head (left side): No tonsillar adenopathy present.    She has no cervical adenopathy.  Neurological: She is alert. Gait normal.  Skin: No rash noted. She is not diaphoretic. No erythema. Nails show no clubbing.  Psychiatric: Mood and affect normal.    Diagnostics:    Spirometry was performed and demonstrated an FEV1 of 1.39 at 45 % of predicted.   Assessment and Plan:   1. Asthma, not well controlled, moderate persistent, with acute exacerbation   2. Other allergic rhinitis   3. LPRD (laryngopharyngeal reflux disease)   4. Heavy tobacco smoker >10 cigarettes per day   5. Migraine syndrome     1. Continue to perform Allergen avoidance measures.   2. Continue to consider nicotine substitutes to replace tobacco smoke.    3. Continue toTreat and prevent inflammation:   A. Symbicort 160 + Flovent 110 2  inhalations 2 times per day with spacer  B. Flonase - one spray each nostril 2 times per day  C. montelukast 10 mg - one tablet one time per day  4. Continue to Treat and prevent reflux:   A. pantoprazole 40 mg one tablet in a.m.  B. ranitidine 300 mg one tablet in PM  5. Continue to Treat and prevent headaches:   A. cyproheptadine 4 mg one tablet at bedtime  B. Consolidate and attempt to eliminate caffeine / chocolate  6. If needed:   A. nasal saline wash  B. Claritin 10 mg tablet 1 time per day  C. ProAir HFA 2 puffs every 4-6 hours  7. For most recent episode:   A. Azithromycin 500 mg one time a day for three days  B. Tamiflu 75 mg twice a day for five days  C. Depomedrol 80mg  IM delivered in clinic  D. Stop Augmentin  8. Return to clinic in 12 weeks or earlier if problem    [name] appears to be worse over the course of the past several days with aches and chills and high temperature and it is quite possible that she has now contracted a  different form of infection affecting her respiratory tract.  I will expand out her bacterial coverage by adding azithromycin which should cover mycoplasma if indeed that is the culprit giving rise to her new symptoms and we will also cover her for influenza with the use of Tamiflu.  In addition, because of her GI distress we will stop Augmentin.  If she still continues to have diarrhea she may need to be evaluated with stool examination looking for C. difficile.  She will continue on all of her other previous therapy and assuming she does well I will see her back in 12 weeks or earlier if there is a problem.  Laurette Schimke, MD Allergy / Immunology [facility]

## 2018-02-03 NOTE — Patient Instructions (Addendum)
  1. Continue to perform Allergen avoidance measures.   2. Continue to consider nicotine substitutes to replace tobacco smoke.    3. Continue toTreat and prevent inflammation:   A. Symbicort 160 + Flovent 110 2 inhalations 2 times per day with spacer  B. Flonase - one spray each nostril 2 times per day  C. montelukast 10 mg - one tablet one time per day  4. Continue to Treat and prevent reflux:   A. pantoprazole 40 mg one tablet in a.m.  B. ranitidine 300 mg one tablet in PM  5. Continue to Treat and prevent headaches:   A. cyproheptadine 4 mg one tablet at bedtime  B. Consolidate and attempt to eliminate caffeine / chocolate  6. If needed:   A. nasal saline wash  B. Claritin 10 mg tablet 1 time per day  C. ProAir HFA 2 puffs every 4-6 hours  7. For most recent episode:   A. Azithromycin 500 mg one time a day for three days  B. Tamiflu 75 mg twice a day for five days  C. Depomedrol 80mg  IM delivered in clinic  D. Stop Augmentin  8. Return to clinic in 12 weeks or earlier if problem

## 2018-02-04 ENCOUNTER — Encounter: Payer: Self-pay | Admitting: Allergy and Immunology

## 2018-02-07 ENCOUNTER — Other Ambulatory Visit: Payer: Self-pay

## 2018-02-07 ENCOUNTER — Telehealth: Payer: Self-pay

## 2018-02-07 MED ORDER — ALBUTEROL SULFATE HFA 108 (90 BASE) MCG/ACT IN AERS: INHALATION_SPRAY | RESPIRATORY_TRACT | 1 refills | Status: DC

## 2018-02-07 NOTE — Telephone Encounter (Signed)
Received fax from [facility] that Ventolin is not covered by insurance. Sent in ProAir rescue inhaler.

## 2018-03-05 DIAGNOSIS — Z6826 Body mass index (BMI) 26.0-26.9, adult: Secondary | ICD-10-CM | POA: Diagnosis not present

## 2018-03-05 DIAGNOSIS — R631 Polydipsia: Secondary | ICD-10-CM | POA: Diagnosis not present

## 2018-03-05 DIAGNOSIS — F329 Major depressive disorder, single episode, unspecified: Secondary | ICD-10-CM | POA: Diagnosis not present

## 2018-03-05 DIAGNOSIS — J4541 Moderate persistent asthma with (acute) exacerbation: Secondary | ICD-10-CM | POA: Diagnosis not present

## 2018-03-11 ENCOUNTER — Ambulatory Visit (INDEPENDENT_AMBULATORY_CARE_PROVIDER_SITE_OTHER): Payer: Medicare Other | Admitting: Allergy

## 2018-03-11 ENCOUNTER — Encounter: Payer: Self-pay | Admitting: Allergy

## 2018-03-11 VITALS — BP 102/70 | HR 88 | Temp 98.3°F | Resp 28

## 2018-03-11 DIAGNOSIS — K219 Gastro-esophageal reflux disease without esophagitis: Secondary | ICD-10-CM

## 2018-03-11 DIAGNOSIS — R05 Cough: Secondary | ICD-10-CM | POA: Diagnosis not present

## 2018-03-11 DIAGNOSIS — F17200 Nicotine dependence, unspecified, uncomplicated: Secondary | ICD-10-CM | POA: Diagnosis not present

## 2018-03-11 DIAGNOSIS — J4541 Moderate persistent asthma with (acute) exacerbation: Secondary | ICD-10-CM

## 2018-03-11 DIAGNOSIS — R0602 Shortness of breath: Secondary | ICD-10-CM | POA: Diagnosis not present

## 2018-03-11 DIAGNOSIS — J3089 Other allergic rhinitis: Secondary | ICD-10-CM

## 2018-03-11 MED ORDER — METHYLPREDNISOLONE ACETATE 80 MG/ML IJ SUSP
80.0000 mg | Freq: Once | INTRAMUSCULAR | Status: AC
  Administered 2018-03-11: 80 mg via INTRAMUSCULAR

## 2018-03-11 MED ORDER — IPRATROPIUM BROMIDE 0.02 % IN SOLN
0.5000 mg | Freq: Once | RESPIRATORY_TRACT | Status: AC
  Administered 2018-03-11: 0.5 mg via RESPIRATORY_TRACT

## 2018-03-11 MED ORDER — LEVALBUTEROL HCL 1.25 MG/3ML IN NEBU
1.2500 mg | INHALATION_SOLUTION | Freq: Once | RESPIRATORY_TRACT | Status: AC
  Administered 2018-03-11: 1.25 mg via RESPIRATORY_TRACT

## 2018-03-11 MED ORDER — LEVALBUTEROL HCL 1.25 MG/3ML IN NEBU: INHALATION_SOLUTION | RESPIRATORY_TRACT | 1 refills | Status: DC

## 2018-03-11 NOTE — Patient Instructions (Addendum)
  1. Continue to perform Allergen avoidance measures.   2. Continue to consider nicotine substitutes to replace tobacco smoke.    3. Continue toTreat and prevent inflammation:   A. Symbicort 160 + Flovent 110 2 inhalations 2 times per day with spacer  B. Flonase - one spray each nostril 2 times per day  C. montelukast 10 mg - one tablet one time per day   D.  Start Spiriva 1 inhalation daily  4. Continue to Treat and prevent reflux:   A. pantoprazole 40 mg one tablet in a.m.  B. ranitidine 300 mg one tablet in PM  5. Continue to Treat and prevent headaches:   A. cyproheptadine 4 mg one tablet at bedtime  B. Consolidate and attempt to eliminate caffeine / chocolate  6. If needed:   A. nasal saline wash  B. Claritin 10 mg tablet 1 time per day  C. ProAir HFA 2 puffs  Or nebulizer every 4-6 hours.  We will prescribe xopenex for nebulizer due to side effects with albuterol  7. For most recent episode:   A.  Depomedrol 80mg  IM delivered in clinic  B.  Get CXR done   8. Return to clinic in 12 weeks or earlier if problem

## 2018-03-11 NOTE — Progress Notes (Signed)
Follow-up Note  RE: [name] MRN: [id number] DOB: 10-19-1986 Date of Office Visit: 03/11/2018   History of present illness: [name] is a 32 y.o. female presenting today for sick visit.  She presents today with her girlfriend.  She was last seen in the office on 02/03/18 by Dr. Lucie Leather at which time she was treated for asthma exacerbation with depomedrol injection as well as azithromycin.  Prior to last visit she was diagnosed with influenza treated with tamiflu and also prescribed augmentin which seem to be exacerbating GI illness she had concomitantly thus this course was stopped.   She does not feel she got really any better in regards to her breathing and states she just "can't breathe".  She received an albuterol nebulizer last week from [facility] and has been using albuterol neb at least twice a day and using her inhaler on top of that.   She states the albuterol does make her feel jittery as well as heart race with palpitations of which she does not like this feeling.  She also was prescribed prednisone 40mg  x 5 days which she still has couple days left.  She is taking her symbicort and flovent as prescribed and does not feel like she is getting much relief from these.  With her cough she states it is productive still and having significant SOB and chest tightness.  She also endorses having emesis but is unsure if it is related to degree of coughing she is doing.  Also having nighttime awakenings.   She states she had reduced amount of caffeine and chocolate that she consumes.   Review of systems: Review of Systems  Constitutional: Positive for malaise/fatigue. Negative for chills and fever.  HENT: Negative for congestion, ear discharge, ear pain, nosebleeds, sinus pain and sore throat.   Eyes: Negative for pain, discharge and redness.  Respiratory: Positive for cough, sputum production and shortness of breath. Negative for hemoptysis.   Cardiovascular: Negative for chest pain.    Gastrointestinal: Positive for vomiting. Negative for heartburn and nausea.  Musculoskeletal: Negative for joint pain and myalgias.  Skin: Negative for itching and rash.  Neurological: Negative for headaches.    Past medical/social/surgical/family history have been reviewed and are unchanged unless specifically indicated below.  No changes  Medication List: Allergies as of 03/11/2018   No Known Allergies     Medication List        Accurate as of 03/11/18  3:10 PM. Always use your most recent med list.          albuterol 108 (90 Base) MCG/ACT inhaler Commonly known as:  PROAIR HFA Inhale two puffs every four to six hours as needed for cough or wheeze.   albuterol (2.5 MG/3ML) 0.083% nebulizer solution Commonly known as:  PROVENTIL PLEASE SEE ATTACHED FOR DETAILED DIRECTIONS   alprazolam 2 MG tablet Commonly known as:  XANAX Take 2 mg by mouth 2 (two) times daily.   budesonide-formoterol 160-4.5 MCG/ACT inhaler Commonly known as:  SYMBICORT Inhale two puffs twice daily with spacer to prevent cough or wheeze.  Rinse, gargle, and spit after use.   cyproheptadine 4 MG tablet Commonly known as:  PERIACTIN Take one tablet by mouth at bedtime as directed.   fluticasone 110 MCG/ACT inhaler Commonly known as:  FLOVENT HFA Inhale two puffs twice daily to prevent cough or wheeze.  Use with a spacer.  Rinse, gargle, and spit after use.   fluticasone 50 MCG/ACT nasal spray Commonly known as:  FLONASE Place 2 sprays into both nostrils daily.   levalbuterol 1.25 MG/3ML nebulizer solution Commonly known as:  XOPENEX Use one vial in the nebulizer every 4-6 hours if needed for cough or wheeze   montelukast 10 MG tablet Commonly known as:  SINGULAIR Take 1 tablet (10 mg total) by mouth daily.   pantoprazole 40 MG tablet Commonly known as:  PROTONIX Take 1 tablet (40 mg total) by mouth every morning.   predniSONE 10 MG tablet Commonly known as:  DELTASONE Take 4 tablets  daily for 5 days   ranitidine 300 MG tablet Commonly known as:  ZANTAC Take 1 tablet (300 mg total) by mouth at bedtime.   TRINTELLIX 20 MG Tabs tablet Generic drug:  vortioxetine HBr Take 20 mg by mouth daily.       Known medication allergies: No Known Allergies   Physical examination: Blood pressure 102/70, pulse 88, temperature 98.3 F (36.8 C), temperature source Oral, resp. rate (!) 28, SpO2 98 %.  General: Alert, interactive, in no acute distress. HEENT: PERRLA, TMs pearly gray, turbinates minimally edematous without discharge, post-pharynx non erythematous. Neck: Supple without lymphadenopathy. Lungs: Decreased breath sounds with expiratory wheezing and rhonchi bilaterally. {increased work of breathing, tachypneic.  Less tachypneic post BD with improved aeration and decreased wheeze and rhonchi CV: Normal S1, S2 without murmurs. Abdomen: Nondistended, nontender. Skin: Warm and dry, without lesions or rashes. Extremities:  No clubbing, cyanosis or edema. Neuro:   Grossly intact.  Diagnositics/Labs: Deferred spirometry due to degree of respiratory symptoms  Assessment and plan:   Asthma, moderate persistent with exacerbation - very symptomatic in the office today with tachypenia and increased WOB improved with xopenex/atrovent neb.  She does feel that this neb works better than albuterol neb she has been using and denies feeling jittery and without increase HR or palpitations.   Will prescribe xopenex/atrovent neb solution for prn use.  Depomedrol injection provided again today.  Will also obtain CXR as having continued symptoms with no repeat from previous initial illness.  Also will have her try addition of spiriva to determine if this provided extra control.  She states insurance coverage has been an issue for in the past and symbicort and flovent is what is currently covered.  Do feel she needs to be considered for biologic asthma medications however will wait to obtain  CBC w diff since currently on steroids.   Allergic rhinitis LRPD Tobacco dependence   1. Continue to perform Allergen avoidance measures.   2. Continue to consider nicotine substitutes to replace tobacco smoke.    3. Continue toTreat and prevent inflammation:   A. Symbicort 160 + Flovent 110 2 inhalations 2 times per day with spacer  B. Flonase - one spray each nostril 2 times per day  C. montelukast 10 mg - one tablet one time per day   D.  Start Spiriva 1 inhalation daily- sample provided  4. Continue to Treat and prevent reflux:   A. pantoprazole 40 mg one tablet in a.m.  B. ranitidine 300 mg one tablet in PM  5. Continue to Treat and prevent headaches:   A. cyproheptadine 4 mg one tablet at bedtime  B. Consolidate and attempt to eliminate caffeine / chocolate  6. If needed:   A. nasal saline wash  B. Claritin 10 mg tablet 1 time per day  C. ProAir HFA 2 puffs  Or nebulizer every 4-6 hours.  We will prescribe xopenex for nebulizer due to side effects with albuterol  7.  For most recent episode:   A.  Depomedrol 80mg  IM delivered in clinic  B.  Get CXR done   8. Return to clinic in 12 weeks or earlier if problem    I appreciate the opportunity to take part in [name]'s care. Please do not hesitate to contact me with questions.  Sincerely,   Margo Aye, MD Allergy/Immunology [facility]

## 2018-03-12 ENCOUNTER — Telehealth: Payer: Self-pay | Admitting: *Deleted

## 2018-03-12 NOTE — Telephone Encounter (Signed)
-----   Message from [facility] Larose Hires, MD sent at 03/12/2018 10:05 AM EDT ----- Regarding: cxr Please let pt know that CXR is normal and she does not need further antibiotics at this time.

## 2018-03-12 NOTE — Telephone Encounter (Signed)
Patient advised of results.

## 2018-03-12 NOTE — Telephone Encounter (Signed)
Left message for patient to call office.  

## 2018-03-13 ENCOUNTER — Other Ambulatory Visit: Payer: Self-pay

## 2018-03-13 MED ORDER — RANITIDINE HCL 300 MG PO TABS: 300.0000 mg | ORAL_TABLET | Freq: Every day | ORAL | 1 refills | Status: DC

## 2018-03-13 MED ORDER — PANTOPRAZOLE SODIUM 40 MG PO TBEC: 40.0000 mg | DELAYED_RELEASE_TABLET | ORAL | 1 refills | Status: DC

## 2018-03-13 MED ORDER — MONTELUKAST SODIUM 10 MG PO TABS: 10.0000 mg | ORAL_TABLET | Freq: Every day | ORAL | 1 refills | Status: DC

## 2018-03-13 MED ORDER — CYPROHEPTADINE HCL 4 MG PO TABS: ORAL_TABLET | ORAL | 1 refills | Status: DC

## 2018-03-21 DIAGNOSIS — S61245A Puncture wound with foreign body of left ring finger without damage to nail, initial encounter: Secondary | ICD-10-CM | POA: Diagnosis not present

## 2018-04-02 DIAGNOSIS — F329 Major depressive disorder, single episode, unspecified: Secondary | ICD-10-CM | POA: Diagnosis not present

## 2018-04-02 DIAGNOSIS — F4310 Post-traumatic stress disorder, unspecified: Secondary | ICD-10-CM | POA: Diagnosis not present

## 2018-04-02 DIAGNOSIS — Z6826 Body mass index (BMI) 26.0-26.9, adult: Secondary | ICD-10-CM | POA: Diagnosis not present

## 2018-04-02 DIAGNOSIS — J4540 Moderate persistent asthma, uncomplicated: Secondary | ICD-10-CM | POA: Diagnosis not present

## 2018-04-21 ENCOUNTER — Encounter: Payer: Self-pay | Admitting: Allergy and Immunology

## 2018-04-21 ENCOUNTER — Ambulatory Visit (INDEPENDENT_AMBULATORY_CARE_PROVIDER_SITE_OTHER): Payer: Medicare Other | Admitting: Allergy and Immunology

## 2018-04-21 VITALS — BP 110/78 | HR 120 | Resp 18

## 2018-04-21 DIAGNOSIS — B370 Candidal stomatitis: Secondary | ICD-10-CM | POA: Diagnosis not present

## 2018-04-21 DIAGNOSIS — J3089 Other allergic rhinitis: Secondary | ICD-10-CM

## 2018-04-21 DIAGNOSIS — F17210 Nicotine dependence, cigarettes, uncomplicated: Secondary | ICD-10-CM

## 2018-04-21 DIAGNOSIS — K219 Gastro-esophageal reflux disease without esophagitis: Secondary | ICD-10-CM | POA: Diagnosis not present

## 2018-04-21 DIAGNOSIS — J4550 Severe persistent asthma, uncomplicated: Secondary | ICD-10-CM

## 2018-04-21 DIAGNOSIS — G43909 Migraine, unspecified, not intractable, without status migrainosus: Secondary | ICD-10-CM | POA: Diagnosis not present

## 2018-04-21 DIAGNOSIS — J4541 Moderate persistent asthma with (acute) exacerbation: Secondary | ICD-10-CM | POA: Diagnosis not present

## 2018-04-21 MED ORDER — IPRATROPIUM BROMIDE 0.02 % IN SOLN: RESPIRATORY_TRACT | 1 refills | Status: DC

## 2018-04-21 MED ORDER — FLUCONAZOLE 150 MG PO TABS: ORAL_TABLET | ORAL | 0 refills | Status: DC

## 2018-04-21 MED ORDER — LEVALBUTEROL HCL 1.25 MG/3ML IN NEBU: INHALATION_SOLUTION | RESPIRATORY_TRACT | 1 refills | Status: DC

## 2018-04-21 NOTE — Patient Instructions (Addendum)
  1. Continue to perform Allergen avoidance measures.   2. Continue to consider nicotine substitutes to replace tobacco smoke.    3. Continue toTreat and prevent inflammation:   A. Symbicort 160 2 inhalations 2 times per day with spacer  B. Flonase - one spray each nostril 2 times per day  C. montelukast 10 mg - one tablet one time per day   D. Discontinue Flovent  4. Continue to Treat and prevent reflux:   A. pantoprazole 40 mg one tablet in a.m.  B. ranitidine 300 mg one tablet in PM  5. Continue to Treat and prevent headaches:   A. cyproheptadine 4 mg one tablet at bedtime  B. Continue to eliminate caffeine / chocolate  6. If needed:   A. nasal saline wash  B. Claritin 10 mg tablet 1 time per day  C. ProAir HFA 2 puffs or Xopenex + Ipratropium nebulizer every 4-6 hours.    7. Diflucan 150 mg one tablet today, repeat one time a week for two weeks  8. Biological agent? - Check CBC w/diff, IgE  9. Use plan to obtain target body weight  10. Return to clinic in 4 weeks or earlier if problem

## 2018-04-21 NOTE — Progress Notes (Signed)
Follow-up Note   Referring Provider: Ronal Fear, NP Primary Provider: Ronal Fear, NP Date of Office Visit: 04/21/2018  Subjective:   [name] (DOB: 04/15/1986) is a 32 y.o. female who [facility] in re-evaluation of the following:  HPI: [name] returns to this clinic in reevaluation of her asthma, allergic rhinitis, migraine headache, LPR, and heavy tobacco smoking.  Her last visit to this clinic was 11 March 2018 with Dr. Delorse Lek for which she received a systemic steroid for an exacerbation of her asthma.  Although she may be somewhat better she still continues to have daily wheezing and coughing especially when she is at work at [employer].  She is a Tourist information centre manager.  She has been using her bronchodilator multiple times a day even in the face of utilizing a very large collection of anti-inflammatory medications for her respiratory tract inflammation.  She still continues to smoke about the same amount of cigarettes per day which is about 1 pack/day.  Her reflux is still occasionally active.  She will regurgitate if she bends over.  She has eliminated all caffeine and chocolate consumption.  Her headaches are completely controlled at this point.    She has been developing a very raspy voice the past several months.  Allergies as of 04/21/2018   No Known Allergies     Medication List      albuterol 108 (90 Base) MCG/ACT inhaler Commonly known as:  PROAIR HFA Inhale two puffs every four to six hours as needed for cough or wheeze.   alprazolam 2 MG tablet Commonly known as:  XANAX Take 2 mg by mouth 2 (two) times daily.   budesonide-formoterol 160-4.5 MCG/ACT inhaler Commonly known as:  SYMBICORT Inhale two puffs twice daily with spacer to prevent cough or wheeze.  Rinse, gargle, and spit after use.   cyproheptadine 4 MG tablet Commonly known as:  PERIACTIN Take one tablet by mouth at bedtime as directed.   fluticasone  110 MCG/ACT inhaler Commonly known as:  FLOVENT HFA Inhale two puffs twice daily to prevent cough or wheeze.  Use with a spacer.  Rinse, gargle, and spit after use.   fluticasone 50 MCG/ACT nasal spray Commonly known as:  FLONASE Place 2 sprays into both nostrils daily.   levalbuterol 1.25 MG/3ML nebulizer solution Commonly known as:  XOPENEX Use one vial in the nebulizer every 4-6 hours if needed for cough or wheeze   montelukast 10 MG tablet Commonly known as:  SINGULAIR Take 1 tablet (10 mg total) by mouth daily.   pantoprazole 40 MG tablet Commonly known as:  PROTONIX Take 1 tablet (40 mg total) by mouth every morning.   ranitidine 300 MG tablet Commonly known as:  ZANTAC Take 1 tablet (300 mg total) by mouth at bedtime.   TRINTELLIX 20 MG Tabs tablet Generic drug:  vortioxetine HBr Take 20 mg by mouth daily.       Past Medical History:  Diagnosis Date  . Anxiety   . Asthma   . Bartholin cyst LEFT SIDE  . Bicornuate uterus S/P EXCISION RIGHT UTERUS HORN 2007  . Depression   . GERD (gastroesophageal reflux disease)   . Headache    sleep, xanax  . History of ovarian cyst   . PTSD (post-traumatic stress disorder)   . Schizophrenia [facility])     Past Surgical History:  Procedure Laterality Date  . APPENDECTOMY    . BARTHOLIN CYST  MARSUPIALIZATION  07/18/2012   Procedure: BARTHOLIN CYST MARSUPIALIZATION;  Surgeon: Ok Edwards, MD;  Location: [facility];  Service: Gynecology;  Laterality: Left;  Left Bartholin Duct Marsupilization. One hour.   Marland Kitchen BARTHOLIN GLAND CYST EXCISION Left 02/11/2015   Procedure: BARTHOLIN GLAND EXCISION;  Surgeon: Mitchel Honour, DO;  Location: [facility];  Service: Gynecology;  Laterality: Left;  . DIAGNOSTIC LAPAROSCOPY    . LACERATION REPAIR Left 05/29/2016   Procedure: REPAIR MULTIPLE LACERATIONS;  Surgeon: Mack Hook, MD;  Location: [facility];  Service: Orthopedics;  Laterality: Left;  laceration left  forearm   . LAPAROSCOPIC APPENDECTOMY AND RIGHT OVARIAN CYSTECTOMY  09-04-2004  . LAPAROSCOPIC EXCISION HYPOPLASTIC RIGHT UTERINE HORN AND RIGHT SALPINGOOPHECTOMY  03-18-2006  . TENDON REPAIR Left 05/29/2016   Procedure: TENDON REPAIR;  Surgeon: Mack Hook, MD;  Location: [facility];  Service: Orthopedics;  Laterality: Left;  . WISDOM TOOTH EXTRACTION      Review of systems negative except as noted in HPI / PMHx or noted below:  Review of Systems  Constitutional: Negative.   HENT: Negative.   Eyes: Negative.   Respiratory: Negative.   Cardiovascular: Negative.   Gastrointestinal: Negative.   Genitourinary: Negative.   Musculoskeletal: Negative.   Skin: Negative.   Neurological: Negative.   Endo/Heme/Allergies: Negative.   Psychiatric/Behavioral: Negative.      Objective:   Vitals:   04/21/18 1559  BP: 110/78  Pulse: (!) 120  Resp: 18          Physical Exam  Constitutional:  Raspy voice  HENT:  Head: Normocephalic.  Right Ear: Tympanic membrane, external ear and ear canal normal.  Left Ear: Tympanic membrane, external ear and ear canal normal.  Nose: Nose normal. No mucosal edema or rhinorrhea.  Mouth/Throat: Uvula is midline and mucous membranes are normal. Oropharyngeal exudate (Widespread thrush) present.  Eyes: Conjunctivae are normal.  Neck: Trachea normal. No tracheal tenderness present. No tracheal deviation present. No thyromegaly present.  Cardiovascular: Normal rate, regular rhythm, S1 normal, S2 normal and normal heart sounds.  No murmur heard. Pulmonary/Chest: Breath sounds normal. No stridor. No respiratory distress. She has no wheezes. She has no rales.  Musculoskeletal: She exhibits no edema.  Lymphadenopathy:       Head (right side): No tonsillar adenopathy present.       Head (left side): No tonsillar adenopathy present.    She has no cervical adenopathy.  Neurological: She is alert.  Skin: No rash noted. She is not  diaphoretic. No erythema. Nails show no clubbing.    Diagnostics:    Spirometry was performed and demonstrated an FEV1 of 1.62 at 53 % of predicted.  The patient had an Asthma Control Test with the following results: ACT Total Score: 8.    Assessment and Plan:   1. Not well controlled severe persistent asthma   2. Other allergic rhinitis   3. LPRD (laryngopharyngeal reflux disease)   4. Migraine syndrome   5. Heavy tobacco smoker >10 cigarettes per day   6. Thrush   7. Asthma, not well controlled, moderate persistent, with acute exacerbation      1. Continue to perform Allergen avoidance measures.   2. Continue to consider nicotine substitutes to replace tobacco smoke.    3. Continue toTreat and prevent inflammation:   A. Symbicort 160 2 inhalations 2 times per day with spacer  B. Flonase - one spray each nostril 2 times per day  C. montelukast 10 mg - one  tablet one time per day   D. Discontinue Flovent  4. Continue to Treat and prevent reflux:   A. pantoprazole 40 mg one tablet in a.m.  B. ranitidine 300 mg one tablet in PM  5. Continue to Treat and prevent headaches:   A. cyproheptadine 4 mg one tablet at bedtime  B. Continue to eliminate caffeine / chocolate  6. If needed:   A. nasal saline wash  B. Claritin 10 mg tablet 1 time per day  C. ProAir HFA 2 puffs or Xopenex + Ipratropium nebulizer every 4-6 hours.    7. Diflucan 150 mg one tablet today, repeat one time a week for two weeks  8. Biological agent? - Check CBC w/diff, IgE  9. Use plan to obtain target body weight  10. Return to clinic in 4 weeks or earlier if problem  Azara appears to have very significant inflammation of her airway once again and she also appears to have thrush.  For her respiratory tract inflammation we will now see if she is a candidate for a biological agent while she continues to use a very large collection of medical therapy directed against inflammation.  As well, I did have a  talk with her today about her reflux which still appears to be active and how she should obtain ideal or target body weight slowly with some plan that allows her to lose weight in a long-term manner.  Fortunately, her headaches are under excellent control at this point on her current therapy.  I will contact her once the results of her blood tests are available for review.  Laurette Schimke, MD Allergy / Immunology [facility]

## 2018-04-22 ENCOUNTER — Encounter: Payer: Self-pay | Admitting: Allergy and Immunology

## 2018-04-22 ENCOUNTER — Other Ambulatory Visit: Payer: Self-pay

## 2018-04-22 MED ORDER — IPRATROPIUM BROMIDE 0.02 % IN SOLN: RESPIRATORY_TRACT | 1 refills | Status: DC

## 2018-04-25 ENCOUNTER — Other Ambulatory Visit: Payer: Self-pay

## 2018-04-25 MED ORDER — IPRATROPIUM BROMIDE 0.02 % IN SOLN: RESPIRATORY_TRACT | 1 refills | Status: DC

## 2018-05-06 DIAGNOSIS — J4550 Severe persistent asthma, uncomplicated: Secondary | ICD-10-CM | POA: Diagnosis not present

## 2018-05-06 DIAGNOSIS — F17210 Nicotine dependence, cigarettes, uncomplicated: Secondary | ICD-10-CM | POA: Diagnosis not present

## 2018-05-06 DIAGNOSIS — R5383 Other fatigue: Secondary | ICD-10-CM | POA: Diagnosis not present

## 2018-05-06 DIAGNOSIS — G4733 Obstructive sleep apnea (adult) (pediatric): Secondary | ICD-10-CM | POA: Diagnosis not present

## 2018-05-06 DIAGNOSIS — R490 Dysphonia: Secondary | ICD-10-CM | POA: Diagnosis not present

## 2018-05-09 DIAGNOSIS — J310 Chronic rhinitis: Secondary | ICD-10-CM | POA: Diagnosis not present

## 2018-05-09 DIAGNOSIS — R5383 Other fatigue: Secondary | ICD-10-CM | POA: Diagnosis not present

## 2018-05-09 DIAGNOSIS — E559 Vitamin D deficiency, unspecified: Secondary | ICD-10-CM | POA: Diagnosis not present

## 2018-05-09 DIAGNOSIS — R05 Cough: Secondary | ICD-10-CM | POA: Diagnosis not present

## 2018-05-14 DIAGNOSIS — G4733 Obstructive sleep apnea (adult) (pediatric): Secondary | ICD-10-CM | POA: Diagnosis not present

## 2018-05-14 DIAGNOSIS — F17210 Nicotine dependence, cigarettes, uncomplicated: Secondary | ICD-10-CM | POA: Diagnosis not present

## 2018-05-14 DIAGNOSIS — J4550 Severe persistent asthma, uncomplicated: Secondary | ICD-10-CM | POA: Diagnosis not present

## 2018-05-14 DIAGNOSIS — R5383 Other fatigue: Secondary | ICD-10-CM | POA: Diagnosis not present

## 2018-05-14 DIAGNOSIS — R490 Dysphonia: Secondary | ICD-10-CM | POA: Diagnosis not present

## 2018-05-15 ENCOUNTER — Ambulatory Visit: Payer: Medicare Other | Admitting: Allergy and Immunology

## 2018-05-20 DIAGNOSIS — R0600 Dyspnea, unspecified: Secondary | ICD-10-CM | POA: Diagnosis not present

## 2018-05-23 DIAGNOSIS — R0600 Dyspnea, unspecified: Secondary | ICD-10-CM | POA: Diagnosis not present

## 2018-05-23 DIAGNOSIS — I517 Cardiomegaly: Secondary | ICD-10-CM

## 2018-05-28 DIAGNOSIS — G4733 Obstructive sleep apnea (adult) (pediatric): Secondary | ICD-10-CM | POA: Diagnosis not present

## 2018-05-28 DIAGNOSIS — J4550 Severe persistent asthma, uncomplicated: Secondary | ICD-10-CM | POA: Diagnosis not present

## 2018-05-28 DIAGNOSIS — F17210 Nicotine dependence, cigarettes, uncomplicated: Secondary | ICD-10-CM | POA: Diagnosis not present

## 2018-05-28 DIAGNOSIS — R5383 Other fatigue: Secondary | ICD-10-CM | POA: Diagnosis not present

## 2018-05-28 DIAGNOSIS — R490 Dysphonia: Secondary | ICD-10-CM | POA: Diagnosis not present

## 2018-06-02 DIAGNOSIS — G4733 Obstructive sleep apnea (adult) (pediatric): Secondary | ICD-10-CM | POA: Diagnosis not present

## 2018-06-09 DIAGNOSIS — F17210 Nicotine dependence, cigarettes, uncomplicated: Secondary | ICD-10-CM | POA: Diagnosis not present

## 2018-06-09 DIAGNOSIS — M79672 Pain in left foot: Secondary | ICD-10-CM | POA: Diagnosis not present

## 2018-06-09 DIAGNOSIS — G8929 Other chronic pain: Secondary | ICD-10-CM | POA: Diagnosis not present

## 2018-06-09 DIAGNOSIS — M545 Low back pain: Secondary | ICD-10-CM | POA: Diagnosis not present

## 2018-06-13 DIAGNOSIS — F4310 Post-traumatic stress disorder, unspecified: Secondary | ICD-10-CM | POA: Diagnosis not present

## 2018-06-13 DIAGNOSIS — Z1331 Encounter for screening for depression: Secondary | ICD-10-CM | POA: Diagnosis not present

## 2018-06-13 DIAGNOSIS — M79672 Pain in left foot: Secondary | ICD-10-CM | POA: Diagnosis not present

## 2018-06-13 DIAGNOSIS — Z0000 Encounter for general adult medical examination without abnormal findings: Secondary | ICD-10-CM | POA: Diagnosis not present

## 2018-06-13 DIAGNOSIS — F329 Major depressive disorder, single episode, unspecified: Secondary | ICD-10-CM | POA: Diagnosis not present

## 2018-06-13 DIAGNOSIS — M545 Low back pain: Secondary | ICD-10-CM | POA: Diagnosis not present

## 2018-06-20 DIAGNOSIS — F17210 Nicotine dependence, cigarettes, uncomplicated: Secondary | ICD-10-CM | POA: Diagnosis not present

## 2018-06-20 DIAGNOSIS — R490 Dysphonia: Secondary | ICD-10-CM | POA: Diagnosis not present

## 2018-06-20 DIAGNOSIS — J4550 Severe persistent asthma, uncomplicated: Secondary | ICD-10-CM | POA: Diagnosis not present

## 2018-06-20 DIAGNOSIS — G4733 Obstructive sleep apnea (adult) (pediatric): Secondary | ICD-10-CM | POA: Diagnosis not present

## 2018-06-20 DIAGNOSIS — R5383 Other fatigue: Secondary | ICD-10-CM | POA: Diagnosis not present

## 2018-06-23 DIAGNOSIS — R2689 Other abnormalities of gait and mobility: Secondary | ICD-10-CM | POA: Diagnosis not present

## 2018-06-23 DIAGNOSIS — M545 Low back pain: Secondary | ICD-10-CM | POA: Diagnosis not present

## 2018-06-23 DIAGNOSIS — M25552 Pain in left hip: Secondary | ICD-10-CM | POA: Diagnosis not present

## 2018-06-27 DIAGNOSIS — M25552 Pain in left hip: Secondary | ICD-10-CM | POA: Diagnosis not present

## 2018-06-27 DIAGNOSIS — R2689 Other abnormalities of gait and mobility: Secondary | ICD-10-CM | POA: Diagnosis not present

## 2018-06-27 DIAGNOSIS — M545 Low back pain: Secondary | ICD-10-CM | POA: Diagnosis not present

## 2018-06-30 DIAGNOSIS — Z79899 Other long term (current) drug therapy: Secondary | ICD-10-CM | POA: Diagnosis not present

## 2018-06-30 DIAGNOSIS — F329 Major depressive disorder, single episode, unspecified: Secondary | ICD-10-CM | POA: Diagnosis not present

## 2018-06-30 DIAGNOSIS — Z1339 Encounter for screening examination for other mental health and behavioral disorders: Secondary | ICD-10-CM | POA: Diagnosis not present

## 2018-06-30 DIAGNOSIS — F4481 Dissociative identity disorder: Secondary | ICD-10-CM | POA: Diagnosis not present

## 2018-06-30 DIAGNOSIS — F29 Unspecified psychosis not due to a substance or known physiological condition: Secondary | ICD-10-CM | POA: Diagnosis not present

## 2018-06-30 DIAGNOSIS — F4310 Post-traumatic stress disorder, unspecified: Secondary | ICD-10-CM | POA: Diagnosis not present

## 2018-07-01 DIAGNOSIS — R2689 Other abnormalities of gait and mobility: Secondary | ICD-10-CM | POA: Diagnosis not present

## 2018-07-01 DIAGNOSIS — M545 Low back pain: Secondary | ICD-10-CM | POA: Diagnosis not present

## 2018-07-01 DIAGNOSIS — M25552 Pain in left hip: Secondary | ICD-10-CM | POA: Diagnosis not present

## 2018-07-04 DIAGNOSIS — M545 Low back pain: Secondary | ICD-10-CM | POA: Diagnosis not present

## 2018-07-04 DIAGNOSIS — R2689 Other abnormalities of gait and mobility: Secondary | ICD-10-CM | POA: Diagnosis not present

## 2018-07-04 DIAGNOSIS — M25552 Pain in left hip: Secondary | ICD-10-CM | POA: Diagnosis not present

## 2018-07-08 DIAGNOSIS — M545 Low back pain: Secondary | ICD-10-CM | POA: Diagnosis not present

## 2018-07-08 DIAGNOSIS — R2689 Other abnormalities of gait and mobility: Secondary | ICD-10-CM | POA: Diagnosis not present

## 2018-07-08 DIAGNOSIS — M25552 Pain in left hip: Secondary | ICD-10-CM | POA: Diagnosis not present

## 2018-07-10 DIAGNOSIS — R0602 Shortness of breath: Secondary | ICD-10-CM | POA: Diagnosis not present

## 2018-07-14 DIAGNOSIS — R05 Cough: Secondary | ICD-10-CM | POA: Diagnosis not present

## 2018-07-14 DIAGNOSIS — K219 Gastro-esophageal reflux disease without esophagitis: Secondary | ICD-10-CM | POA: Diagnosis not present

## 2018-07-14 DIAGNOSIS — R490 Dysphonia: Secondary | ICD-10-CM | POA: Diagnosis not present

## 2018-07-14 DIAGNOSIS — J342 Deviated nasal septum: Secondary | ICD-10-CM | POA: Diagnosis not present

## 2018-07-15 DIAGNOSIS — M25552 Pain in left hip: Secondary | ICD-10-CM | POA: Diagnosis not present

## 2018-07-15 DIAGNOSIS — M545 Low back pain: Secondary | ICD-10-CM | POA: Diagnosis not present

## 2018-07-15 DIAGNOSIS — R2689 Other abnormalities of gait and mobility: Secondary | ICD-10-CM | POA: Diagnosis not present

## 2018-07-16 DIAGNOSIS — R2689 Other abnormalities of gait and mobility: Secondary | ICD-10-CM | POA: Diagnosis not present

## 2018-07-16 DIAGNOSIS — M25552 Pain in left hip: Secondary | ICD-10-CM | POA: Diagnosis not present

## 2018-07-16 DIAGNOSIS — M545 Low back pain: Secondary | ICD-10-CM | POA: Diagnosis not present

## 2018-07-17 DIAGNOSIS — G4733 Obstructive sleep apnea (adult) (pediatric): Secondary | ICD-10-CM | POA: Diagnosis not present

## 2018-07-18 DIAGNOSIS — G4733 Obstructive sleep apnea (adult) (pediatric): Secondary | ICD-10-CM | POA: Diagnosis not present

## 2018-07-18 DIAGNOSIS — R5383 Other fatigue: Secondary | ICD-10-CM | POA: Diagnosis not present

## 2018-07-18 DIAGNOSIS — R490 Dysphonia: Secondary | ICD-10-CM | POA: Diagnosis not present

## 2018-07-18 DIAGNOSIS — F17210 Nicotine dependence, cigarettes, uncomplicated: Secondary | ICD-10-CM | POA: Diagnosis not present

## 2018-07-18 DIAGNOSIS — J4550 Severe persistent asthma, uncomplicated: Secondary | ICD-10-CM | POA: Diagnosis not present

## 2018-07-21 DIAGNOSIS — R2689 Other abnormalities of gait and mobility: Secondary | ICD-10-CM | POA: Diagnosis not present

## 2018-07-21 DIAGNOSIS — M545 Low back pain: Secondary | ICD-10-CM | POA: Diagnosis not present

## 2018-07-21 DIAGNOSIS — M25552 Pain in left hip: Secondary | ICD-10-CM | POA: Diagnosis not present

## 2018-07-22 DIAGNOSIS — J4541 Moderate persistent asthma with (acute) exacerbation: Secondary | ICD-10-CM | POA: Diagnosis not present

## 2018-07-22 DIAGNOSIS — F4310 Post-traumatic stress disorder, unspecified: Secondary | ICD-10-CM | POA: Diagnosis not present

## 2018-07-22 DIAGNOSIS — M545 Low back pain: Secondary | ICD-10-CM | POA: Diagnosis not present

## 2018-07-22 DIAGNOSIS — F329 Major depressive disorder, single episode, unspecified: Secondary | ICD-10-CM | POA: Diagnosis not present

## 2018-07-24 DIAGNOSIS — R2689 Other abnormalities of gait and mobility: Secondary | ICD-10-CM | POA: Diagnosis not present

## 2018-07-24 DIAGNOSIS — M545 Low back pain: Secondary | ICD-10-CM | POA: Diagnosis not present

## 2018-07-24 DIAGNOSIS — M25552 Pain in left hip: Secondary | ICD-10-CM | POA: Diagnosis not present

## 2018-07-26 DIAGNOSIS — M5126 Other intervertebral disc displacement, lumbar region: Secondary | ICD-10-CM | POA: Diagnosis not present

## 2018-07-26 DIAGNOSIS — M545 Low back pain: Secondary | ICD-10-CM | POA: Diagnosis not present

## 2018-07-29 DIAGNOSIS — G4733 Obstructive sleep apnea (adult) (pediatric): Secondary | ICD-10-CM | POA: Diagnosis not present

## 2018-07-30 DIAGNOSIS — M25552 Pain in left hip: Secondary | ICD-10-CM | POA: Diagnosis not present

## 2018-07-30 DIAGNOSIS — M545 Low back pain: Secondary | ICD-10-CM | POA: Diagnosis not present

## 2018-07-30 DIAGNOSIS — R2689 Other abnormalities of gait and mobility: Secondary | ICD-10-CM | POA: Diagnosis not present

## 2018-07-31 DIAGNOSIS — R0602 Shortness of breath: Secondary | ICD-10-CM | POA: Diagnosis not present

## 2018-08-04 DIAGNOSIS — M542 Cervicalgia: Secondary | ICD-10-CM | POA: Diagnosis not present

## 2018-08-04 DIAGNOSIS — M5126 Other intervertebral disc displacement, lumbar region: Secondary | ICD-10-CM | POA: Diagnosis not present

## 2018-08-04 DIAGNOSIS — Z6825 Body mass index (BMI) 25.0-25.9, adult: Secondary | ICD-10-CM | POA: Diagnosis not present

## 2018-08-04 DIAGNOSIS — N907 Vulvar cyst: Secondary | ICD-10-CM | POA: Diagnosis not present

## 2018-08-06 DIAGNOSIS — M50322 Other cervical disc degeneration at C5-C6 level: Secondary | ICD-10-CM | POA: Diagnosis not present

## 2018-08-06 DIAGNOSIS — M50321 Other cervical disc degeneration at C4-C5 level: Secondary | ICD-10-CM | POA: Diagnosis not present

## 2018-08-12 DIAGNOSIS — F4310 Post-traumatic stress disorder, unspecified: Secondary | ICD-10-CM | POA: Diagnosis not present

## 2018-08-12 DIAGNOSIS — R0602 Shortness of breath: Secondary | ICD-10-CM | POA: Diagnosis not present

## 2018-08-12 DIAGNOSIS — K219 Gastro-esophageal reflux disease without esophagitis: Secondary | ICD-10-CM | POA: Diagnosis not present

## 2018-08-12 DIAGNOSIS — R0609 Other forms of dyspnea: Secondary | ICD-10-CM | POA: Diagnosis not present

## 2018-08-12 DIAGNOSIS — F17200 Nicotine dependence, unspecified, uncomplicated: Secondary | ICD-10-CM | POA: Diagnosis not present

## 2018-08-12 DIAGNOSIS — F329 Major depressive disorder, single episode, unspecified: Secondary | ICD-10-CM | POA: Diagnosis not present

## 2018-08-12 DIAGNOSIS — Z79899 Other long term (current) drug therapy: Secondary | ICD-10-CM | POA: Diagnosis not present

## 2018-08-12 DIAGNOSIS — G4700 Insomnia, unspecified: Secondary | ICD-10-CM | POA: Diagnosis not present

## 2018-08-12 DIAGNOSIS — G4733 Obstructive sleep apnea (adult) (pediatric): Secondary | ICD-10-CM | POA: Diagnosis not present

## 2018-08-21 DIAGNOSIS — M5126 Other intervertebral disc displacement, lumbar region: Secondary | ICD-10-CM | POA: Diagnosis not present

## 2018-08-27 DIAGNOSIS — R5383 Other fatigue: Secondary | ICD-10-CM | POA: Diagnosis not present

## 2018-08-27 DIAGNOSIS — F17210 Nicotine dependence, cigarettes, uncomplicated: Secondary | ICD-10-CM | POA: Diagnosis not present

## 2018-08-27 DIAGNOSIS — G4733 Obstructive sleep apnea (adult) (pediatric): Secondary | ICD-10-CM | POA: Diagnosis not present

## 2018-08-27 DIAGNOSIS — J4550 Severe persistent asthma, uncomplicated: Secondary | ICD-10-CM | POA: Diagnosis not present

## 2018-09-02 DIAGNOSIS — R05 Cough: Secondary | ICD-10-CM | POA: Diagnosis not present

## 2018-09-02 DIAGNOSIS — J209 Acute bronchitis, unspecified: Secondary | ICD-10-CM | POA: Diagnosis not present

## 2018-09-02 DIAGNOSIS — J4550 Severe persistent asthma, uncomplicated: Secondary | ICD-10-CM | POA: Diagnosis not present

## 2018-09-02 DIAGNOSIS — F17210 Nicotine dependence, cigarettes, uncomplicated: Secondary | ICD-10-CM | POA: Diagnosis not present

## 2018-09-02 DIAGNOSIS — G4733 Obstructive sleep apnea (adult) (pediatric): Secondary | ICD-10-CM | POA: Diagnosis not present

## 2018-09-07 ENCOUNTER — Other Ambulatory Visit: Payer: Self-pay | Admitting: Allergy and Immunology

## 2018-09-10 DIAGNOSIS — J4550 Severe persistent asthma, uncomplicated: Secondary | ICD-10-CM | POA: Diagnosis not present

## 2018-09-12 ENCOUNTER — Other Ambulatory Visit: Payer: Self-pay | Admitting: *Deleted

## 2018-09-12 MED ORDER — RANITIDINE HCL 300 MG PO TABS: 300.0000 mg | ORAL_TABLET | Freq: Every day | ORAL | 0 refills | Status: DC

## 2018-09-12 NOTE — Telephone Encounter (Signed)
Patient advised can have one refill and needs appt

## 2018-09-15 DIAGNOSIS — J4550 Severe persistent asthma, uncomplicated: Secondary | ICD-10-CM | POA: Diagnosis not present

## 2018-09-15 DIAGNOSIS — G4733 Obstructive sleep apnea (adult) (pediatric): Secondary | ICD-10-CM | POA: Diagnosis not present

## 2018-09-15 DIAGNOSIS — F17210 Nicotine dependence, cigarettes, uncomplicated: Secondary | ICD-10-CM | POA: Diagnosis not present

## 2018-09-15 DIAGNOSIS — R05 Cough: Secondary | ICD-10-CM | POA: Diagnosis not present

## 2018-09-16 DIAGNOSIS — R05 Cough: Secondary | ICD-10-CM | POA: Diagnosis not present

## 2018-09-28 DIAGNOSIS — F17210 Nicotine dependence, cigarettes, uncomplicated: Secondary | ICD-10-CM | POA: Diagnosis not present

## 2018-09-28 DIAGNOSIS — R0602 Shortness of breath: Secondary | ICD-10-CM | POA: Diagnosis not present

## 2018-09-28 DIAGNOSIS — R05 Cough: Secondary | ICD-10-CM | POA: Diagnosis not present

## 2018-09-28 DIAGNOSIS — J45909 Unspecified asthma, uncomplicated: Secondary | ICD-10-CM | POA: Diagnosis not present

## 2018-09-28 DIAGNOSIS — R7989 Other specified abnormal findings of blood chemistry: Secondary | ICD-10-CM | POA: Diagnosis not present

## 2018-09-28 DIAGNOSIS — J181 Lobar pneumonia, unspecified organism: Secondary | ICD-10-CM | POA: Diagnosis not present

## 2018-10-01 DIAGNOSIS — F17210 Nicotine dependence, cigarettes, uncomplicated: Secondary | ICD-10-CM | POA: Diagnosis not present

## 2018-10-01 DIAGNOSIS — R0602 Shortness of breath: Secondary | ICD-10-CM | POA: Diagnosis not present

## 2018-10-01 DIAGNOSIS — J45909 Unspecified asthma, uncomplicated: Secondary | ICD-10-CM | POA: Diagnosis not present

## 2018-10-15 ENCOUNTER — Other Ambulatory Visit: Payer: Self-pay | Admitting: Allergy and Immunology

## 2018-10-17 DIAGNOSIS — K219 Gastro-esophageal reflux disease without esophagitis: Secondary | ICD-10-CM | POA: Diagnosis not present

## 2018-10-17 DIAGNOSIS — R197 Diarrhea, unspecified: Secondary | ICD-10-CM | POA: Diagnosis not present

## 2018-10-17 DIAGNOSIS — R109 Unspecified abdominal pain: Secondary | ICD-10-CM | POA: Diagnosis not present

## 2018-10-23 DIAGNOSIS — H52223 Regular astigmatism, bilateral: Secondary | ICD-10-CM | POA: Diagnosis not present

## 2018-10-30 ENCOUNTER — Encounter: Payer: Self-pay | Admitting: Allergy and Immunology

## 2018-10-30 ENCOUNTER — Ambulatory Visit (INDEPENDENT_AMBULATORY_CARE_PROVIDER_SITE_OTHER): Payer: Medicare Other | Admitting: Allergy and Immunology

## 2018-10-30 VITALS — BP 120/84 | HR 76 | Temp 98.6°F | Resp 20

## 2018-10-30 DIAGNOSIS — K219 Gastro-esophageal reflux disease without esophagitis: Secondary | ICD-10-CM | POA: Diagnosis not present

## 2018-10-30 DIAGNOSIS — F17210 Nicotine dependence, cigarettes, uncomplicated: Secondary | ICD-10-CM

## 2018-10-30 DIAGNOSIS — J3089 Other allergic rhinitis: Secondary | ICD-10-CM | POA: Diagnosis not present

## 2018-10-30 DIAGNOSIS — J4550 Severe persistent asthma, uncomplicated: Secondary | ICD-10-CM | POA: Diagnosis not present

## 2018-10-30 DIAGNOSIS — G43909 Migraine, unspecified, not intractable, without status migrainosus: Secondary | ICD-10-CM

## 2018-10-30 MED ORDER — MONTELUKAST SODIUM 10 MG PO TABS: 10.0000 mg | ORAL_TABLET | Freq: Every day | ORAL | 1 refills | Status: DC

## 2018-10-30 NOTE — Patient Instructions (Addendum)
  1. Continue to perform Allergen avoidance measures.   2. Continue to consider nicotine substitutes to replace tobacco smoke.    3. Continue toTreat and prevent inflammation:   A. Symbicort 160 2 inhalations 2 times per day with spacer  B. Flonase - one spray each nostril 2 times per day  C. montelukast 10 mg - one tablet one time per day    4. Continue to Treat and prevent reflux:   A.  pantoprazole 40 mg 2 times per day  B.  Famotidine 40 mg 2 times per day  5. Continue to Treat and prevent headaches:   A. Continue to eliminate caffeine / chocolate  6. If needed:   A. nasal saline wash  B. Claritin 10 mg tablet 1 time per day  C. ProAir HFA 2 puffs or Xopenex + Ipratropium nebulizer every 4-6 hours.    7. Biological agent? - Check CBC w/diff, area two aero allergen profile, IgA/G/M  8. Obtain the GI evaluation   9.  Return to clinic in 4 weeks or earlier if problem

## 2018-10-30 NOTE — Progress Notes (Signed)
Follow-up Note  Referring Provider: Ronal Fear, NP Primary Provider: Ronal Fear, NP Date of Office Visit: 10/30/2018  Subjective:   [name] (DOB: 05-Feb-1986) is a 32 y.o. female [facility] in re-evaluation of the following:  HPI: [name] returns to this clinic in evaluation of asthma and allergic rhinitis and migraine headache and LPR and tobacco smoke exposure.  I last saw her in this clinic in 21 April 2018 at which point time she appeared to have extensive oral candidiasis.  Fortunately, her candidiasis cleared up with a combination of Diflucan utilized weekly for 3 weeks.     She still continues to wheeze and cough and use a bronchodilator extensively and has required prednisone this summer and prednisone this September.  Apparently her episode in September of coughing and wheezing and shortness of breath may have also had fever and she was diagnosed with a pneumonia and given an Zithromax and what sounds like Levaquin.  She has apparently seen a local pulmonologist in [place] and then subsequently a local pulmonologist in [place] and has undergone an extensive amount of evaluation including multiple imaging procedures, right heart catheterization, sleep study with a diagnosis of sleep apnea treated with CPAP, and was given multiple medications which really did not affect her in any positive way.  She continues to smoke extensively.  She believes that her reflux is under good control at this point in time and she still continues to have lots of diarrhea and she is scheduled to have an upper endoscopy and a colonoscopy at some point in near future.  Her headaches are under excellent control as long as she remains away from caffeine at this point.  She no longer requires any cyproheptadine.  When she was last seen in this clinic I asked her to obtain blood tests in investigation of inclusion criteria for utilizing a biological  agent to treat her lung condition.  Unfortunately, she never did have those blood tests performed.  Allergies as of 10/30/2018   No Known Allergies     Medication List      albuterol (2.5 MG/3ML) 0.083% nebulizer solution Commonly known as:  PROVENTIL Take 2.5 mg by nebulization every 4 (four) hours as needed for wheezing or shortness of breath.   albuterol 108 (90 Base) MCG/ACT inhaler Commonly known as:  PROVENTIL HFA;VENTOLIN HFA Inhale two puffs every four to six hours as needed for cough or wheeze.   alprazolam 2 MG tablet Commonly known as:  XANAX Take 2 mg by mouth 2 (two) times daily.   budesonide-formoterol 160-4.5 MCG/ACT inhaler Commonly known as:  SYMBICORT Inhale two puffs twice daily with spacer to prevent cough or wheeze.  Rinse, gargle, and spit after use.   citalopram 40 MG tablet Commonly known as:  CELEXA TAKE 1 TABLET BY MOUTH DAILY (REPLACING TRINTELLIX)   dicyclomine 20 MG tablet Commonly known as:  BENTYL Take 20 mg by mouth 3 (three) times daily as needed.   famotidine 40 MG tablet Commonly known as:  PEPCID Take 40 mg by mouth 2 (two) times daily.   fluticasone 50 MCG/ACT nasal spray Commonly known as:  FLONASE Place 2 sprays into both nostrils daily.   gabapentin 800 MG tablet Commonly known as:  NEURONTIN Take 800 mg by mouth at bedtime.   HYDROcodone-acetaminophen 5-325 MG tablet Commonly known as:  NORCO/VICODIN Take 1 tablet by mouth every 6 (six) hours as needed. for pain  ipratropium 0.02 % nebulizer solution Commonly known as:  ATROVENT Can inhale the contents of one vial in nebulizer every four to six hours as needed for cough or wheeze.  Mix with Levalbuterol.   levalbuterol 1.25 MG/3ML nebulizer solution Commonly known as:  XOPENEX Use one vial in the nebulizer every 4-6 hours if needed for cough or wheeze.  Mix with Ipratropium as directed.   loratadine 10 MG tablet Commonly known as:  CLARITIN Take 10 mg by mouth  daily.   methocarbamol 500 MG tablet Commonly known as:  ROBAXIN Take 1,000 mg by mouth 4 (four) times daily.   montelukast 10 MG tablet Commonly known as:  SINGULAIR Take 1 tablet (10 mg total) by mouth daily.   pantoprazole 40 MG tablet Commonly known as:  PROTONIX Take 1 tablet (40 mg total) by mouth every morning.       Past Medical History:  Diagnosis Date  . Anxiety   . Asthma   . Bartholin cyst LEFT SIDE  . Bicornuate uterus S/P EXCISION RIGHT UTERUS HORN 2007  . Depression   . GERD (gastroesophageal reflux disease)   . Headache    sleep, xanax  . History of ovarian cyst   . PTSD (post-traumatic stress disorder)   . Schizophrenia [facility])     Past Surgical History:  Procedure Laterality Date  . APPENDECTOMY    . BARTHOLIN CYST MARSUPIALIZATION  07/18/2012   Procedure: BARTHOLIN CYST MARSUPIALIZATION;  Surgeon: Ok Edwards, MD;  [facility];  Service: Gynecology;  Laterality: Left;  Left Bartholin Duct Marsupilization. One hour.   Marland Kitchen BARTHOLIN GLAND CYST EXCISION Left 02/11/2015   Procedure: BARTHOLIN GLAND EXCISION;  Surgeon: Mitchel Honour, DO;  Location: [facility];  Service: Gynecology;  Laterality: Left;  . DIAGNOSTIC LAPAROSCOPY    . LACERATION REPAIR Left 05/29/2016   Procedure: REPAIR MULTIPLE LACERATIONS;  Surgeon: Mack Hook, MD;  Location: [facility];  Service: Orthopedics;  Laterality: Left;  laceration left forearm   . LAPAROSCOPIC APPENDECTOMY AND RIGHT OVARIAN CYSTECTOMY  09-04-2004  . LAPAROSCOPIC EXCISION HYPOPLASTIC RIGHT UTERINE HORN AND RIGHT SALPINGOOPHECTOMY  03-18-2006  . TENDON REPAIR Left 05/29/2016   Procedure: TENDON REPAIR;  Surgeon: Mack Hook, MD;  Location: [facility];  Service: Orthopedics;  Laterality: Left;  . WISDOM TOOTH EXTRACTION      Review of systems negative except as noted in HPI / PMHx or noted below:  Review of Systems  Constitutional: Negative.   HENT:  Negative.   Eyes: Negative.   Respiratory: Negative.   Cardiovascular: Negative.   Gastrointestinal: Negative.   Genitourinary: Negative.   Musculoskeletal: Negative.   Skin: Negative.   Neurological: Negative.   Endo/Heme/Allergies: Negative.   Psychiatric/Behavioral: Negative.      Objective:   Vitals:   10/30/18 1628  BP: 120/84  Pulse: 76  Resp: 20  Temp: 98.6 F (37 C)          Physical Exam  HENT:  Head: Normocephalic.  Right Ear: Tympanic membrane, external ear and ear canal normal.  Left Ear: Tympanic membrane, external ear and ear canal normal.  Nose: Nose normal. No mucosal edema or rhinorrhea.  Mouth/Throat: Uvula is midline, oropharynx is clear and moist and mucous membranes are normal. No oropharyngeal exudate.  Eyes: Conjunctivae are normal.  Neck: Trachea normal. No tracheal tenderness present. No tracheal deviation present. No thyromegaly present.  Cardiovascular: Normal rate, regular rhythm, S1 normal, S2 normal and normal heart sounds.  No murmur  heard. Pulmonary/Chest: No stridor. No respiratory distress. She has wheezes (Bilateral expiratory wheezes posterior lung fields). She has no rales.  Musculoskeletal: She exhibits no edema.  Lymphadenopathy:       Head (right side): No tonsillar adenopathy present.       Head (left side): No tonsillar adenopathy present.    She has no cervical adenopathy.  Neurological: She is alert.  Skin: No rash noted. She is not diaphoretic. No erythema. Nails show no clubbing.    Diagnostics:    Spirometry was performed and demonstrated an FEV1 of 1.54 at 50 % of predicted.  The patient had an Asthma Control Test with the following results: ACT Total Score: 12.    Results of the chest x-ray obtained 28 September 2018 identified no cardiopulmonary disease.  Results of a CT angiography chest obtained 28 September 2018 identified no pulmonary embolus, normal mediastinum, mild patchy areas of airspace disease right  upper lobe.  Results of a chest CT scan obtained 20 May 2018 identified normal mediastinum, no focal airspace consolidation.  Assessment and Plan:   1. Not well controlled severe persistent asthma   2. Other allergic rhinitis   3. LPRD (laryngopharyngeal reflux disease)   4. Migraine syndrome   5. Heavy tobacco smoker >10 cigarettes per day       1. Continue to perform Allergen avoidance measures.   2. Continue to consider nicotine substitutes to replace tobacco smoke.    3. Continue toTreat and prevent inflammation:   A. Symbicort 160 2 inhalations 2 times per day with spacer  B. Flonase - one spray each nostril 2 times per day  C. montelukast 10 mg - one tablet one time per day   4. Continue to Treat and prevent reflux:   A.  pantoprazole 40 mg 2 times per day  B.  Famotidine 40 mg 2 times per day  5. Continue to Treat and prevent headaches:   A. Continue to eliminate caffeine / chocolate  6. If needed:   A. nasal saline wash  B. Claritin 10 mg tablet 1 time per day  C. ProAir HFA 2 puffs or Xopenex + Ipratropium nebulizer every 4-6 hours.    7. Biological agent? - Check CBC w/diff, area two aero allergen profile, IgA/G/M  8. Obtain the GI evaluation   9.  Return to clinic in 4 weeks or earlier if problem  Neidra has significant inflammation of her airway once again and we will see if she qualifies for a biological agent.  Of course, I did have a talk with her today about the need to stop smoking.  Her reflux appears to be under good control on her current plan and her headaches are under good control.  She will follow-up with GI regarding her chronic diarrhea.  I will see her back in this clinic in 4 weeks or earlier if there is a problem.  Laurette Schimke, MD Allergy / Immunology [facility]

## 2018-11-03 ENCOUNTER — Encounter: Payer: Self-pay | Admitting: Allergy and Immunology

## 2018-11-04 DIAGNOSIS — J4550 Severe persistent asthma, uncomplicated: Secondary | ICD-10-CM | POA: Diagnosis not present

## 2018-11-07 DIAGNOSIS — Z23 Encounter for immunization: Secondary | ICD-10-CM | POA: Diagnosis not present

## 2018-11-07 DIAGNOSIS — K219 Gastro-esophageal reflux disease without esophagitis: Secondary | ICD-10-CM | POA: Diagnosis not present

## 2018-11-07 DIAGNOSIS — R0600 Dyspnea, unspecified: Secondary | ICD-10-CM | POA: Diagnosis not present

## 2018-11-07 DIAGNOSIS — F4310 Post-traumatic stress disorder, unspecified: Secondary | ICD-10-CM | POA: Diagnosis not present

## 2018-11-07 DIAGNOSIS — M4800 Spinal stenosis, site unspecified: Secondary | ICD-10-CM | POA: Diagnosis not present

## 2018-11-09 LAB — IGG, IGA, IGM
IGM (IMMUNOGLOBULIN M), SRM: 131 mg/dL (ref 26–217)
IgA/Immunoglobulin A, Serum: 329 mg/dL (ref 87–352)
IgG (Immunoglobin G), Serum: 855 mg/dL (ref 700–1600)

## 2018-11-09 LAB — ALLERGENS W/TOTAL IGE AREA 2
Bermuda Grass IgE: <0.1 kU/L
Cedar, Mountain IgE: <0.1 kU/L
Cockroach, German IgE: 0.19 kU/L — AB
Common Silver Birch IgE: <0.1 kU/L
Cottonwood IgE: <0.1 kU/L
D Farinae IgE: 0.27 kU/L — AB
D Pteronyssinus IgE: 0.26 kU/L — AB
Dog Dander IgE: <0.1 kU/L
IgE (Immunoglobulin E), Serum: 93 IU/mL (ref 6–495)
Maple/Box Elder IgE: <0.1 kU/L
Mouse Urine IgE: <0.1 kU/L
Oak, White IgE: <0.1 kU/L
Pigweed, Rough IgE: <0.1 kU/L
Ragweed, Short IgE: 0.12 kU/L — AB
Sheep Sorrel IgE Qn: <0.1 kU/L

## 2018-11-09 LAB — CBC WITH DIFFERENTIAL/PLATELET
BASOS: 0 %
Basophils Absolute: 0 10*3/uL (ref 0.0–0.2)
EOS (ABSOLUTE): 0.1 10*3/uL (ref 0.0–0.4)
EOS: 1 %
HEMATOCRIT: 42.4 % (ref 34.0–46.6)
Hemoglobin: 14.7 g/dL (ref 11.1–15.9)
Lymphocytes Absolute: 2.9 10*3/uL (ref 0.7–3.1)
Lymphs: 44 %
MCH: 34.6 pg — AB (ref 26.6–33.0)
MCHC: 34.7 g/dL (ref 31.5–35.7)
MCV: 100 fL — AB (ref 79–97)
MONOCYTES: 7 %
Monocytes Absolute: 0.4 10*3/uL (ref 0.1–0.9)
NEUTROS ABS: 3.3 10*3/uL (ref 1.4–7.0)
Neutrophils: 48 %
Platelets: 275 10*3/uL (ref 150–450)
RBC: 4.25 x10E6/uL (ref 3.77–5.28)
RDW: 12.8 % (ref 12.3–15.4)
WBC: 6.7 10*3/uL (ref 3.4–10.8)

## 2018-11-13 DIAGNOSIS — B3781 Candidal esophagitis: Secondary | ICD-10-CM | POA: Diagnosis not present

## 2018-11-13 DIAGNOSIS — K219 Gastro-esophageal reflux disease without esophagitis: Secondary | ICD-10-CM | POA: Diagnosis not present

## 2018-11-13 DIAGNOSIS — R197 Diarrhea, unspecified: Secondary | ICD-10-CM | POA: Diagnosis not present

## 2018-11-21 DIAGNOSIS — M25531 Pain in right wrist: Secondary | ICD-10-CM | POA: Diagnosis not present

## 2018-11-21 DIAGNOSIS — R1084 Generalized abdominal pain: Secondary | ICD-10-CM | POA: Diagnosis not present

## 2018-11-21 DIAGNOSIS — R197 Diarrhea, unspecified: Secondary | ICD-10-CM | POA: Diagnosis not present

## 2018-11-21 DIAGNOSIS — R110 Nausea: Secondary | ICD-10-CM | POA: Diagnosis not present

## 2018-11-24 DIAGNOSIS — M5126 Other intervertebral disc displacement, lumbar region: Secondary | ICD-10-CM | POA: Diagnosis not present

## 2018-12-01 ENCOUNTER — Ambulatory Visit (INDEPENDENT_AMBULATORY_CARE_PROVIDER_SITE_OTHER): Payer: Medicare Other | Admitting: Allergy and Immunology

## 2018-12-01 ENCOUNTER — Encounter: Payer: Self-pay | Admitting: Allergy and Immunology

## 2018-12-01 VITALS — BP 116/78 | HR 88 | Resp 20

## 2018-12-01 DIAGNOSIS — K219 Gastro-esophageal reflux disease without esophagitis: Secondary | ICD-10-CM | POA: Diagnosis not present

## 2018-12-01 DIAGNOSIS — J3089 Other allergic rhinitis: Secondary | ICD-10-CM | POA: Diagnosis not present

## 2018-12-01 DIAGNOSIS — F17210 Nicotine dependence, cigarettes, uncomplicated: Secondary | ICD-10-CM

## 2018-12-01 DIAGNOSIS — G43909 Migraine, unspecified, not intractable, without status migrainosus: Secondary | ICD-10-CM

## 2018-12-01 DIAGNOSIS — R110 Nausea: Secondary | ICD-10-CM | POA: Diagnosis not present

## 2018-12-01 DIAGNOSIS — J4550 Severe persistent asthma, uncomplicated: Secondary | ICD-10-CM

## 2018-12-01 DIAGNOSIS — R1084 Generalized abdominal pain: Secondary | ICD-10-CM | POA: Diagnosis not present

## 2018-12-01 DIAGNOSIS — R197 Diarrhea, unspecified: Secondary | ICD-10-CM | POA: Diagnosis not present

## 2018-12-01 NOTE — Patient Instructions (Addendum)
  1. Continue to perform Allergen avoidance measures - House dust mite  2. Continue to consider nicotine substitutes to replace tobacco smoke.    3. Continue toTreat and prevent inflammation:   A. Symbicort 160 2 inhalations 2 times per day with spacer  B. Flonase - one spray each nostril 2 times per day  C. montelukast 10 mg - one tablet one time per day   4. Continue to Treat and prevent reflux:   A.  pantoprazole 40 mg 2 times per day  B.  Famotidine 40 mg 2 times per day  C.  Carafate slurry as directed  5. Continue to Treat and prevent headaches:   A. Continue to eliminate caffeine / chocolate  6. If needed:   A. nasal saline wash  B. Claritin 10 mg tablet 1 time per day  C. ProAir HFA 2 puffs or Xopenex + Ipratropium nebulizer every 4-6 hours.    7. Start Immunotherapy  8. Start Xolair   9.  Return to clinic in 12 weeks or earlier if problem

## 2018-12-01 NOTE — Progress Notes (Signed)
Follow-up Note  Referring Provider: Ronal Fear, NP Primary Provider: Charmayne Sheer, MD Date of Office Visit: 12/01/2018  Subjective:   [name] (DOB: 1986-10-10) is a 32 y.o. female who [facility] 12[facility]2018 in re-evaluation of the following:  HPI: [name] returns to this clinic in reevaluation of her asthma and allergic rhinitis and headaches and LPR and tobacco smoke exposure.  Her last visit to this clinic was 30 October 2018.  She believes that she is doing relatively well.  She has very little coughing and wheezing and she has had a decrease in her bronchodilator requirement to 1 or 2 times per week and she continues to utilize a large collection of medical therapy directed against inflammation.  Unfortunately, she still continues to smoke extensively.  She is using Xanax to help with her panic attacks although she did have a relatively significant panic attack when she received an epidural for left-sided lower extremity radiculopathy recently.  She thinks her reflux is okay on her current plan.  She still has regurgitation if she bends over.  She has had Carafate added to her proton pump inhibitor and H2 receptor blocker.  Fortunately, her preexistent diarrhea appears to be doing very well.  She did apparently start some pain medicines for her radiculopathy which apparently has helped her diarrhea.  Headaches have not been up a significant issue while she remains away from caffeine consumption.  Allergies as of 12/01/2018   No Known Allergies     Medication List      albuterol 108 (90 Base) MCG/ACT inhaler Commonly known as:  PROVENTIL HFA;VENTOLIN HFA Inhale two puffs every four to six hours as needed for cough or wheeze.   alprazolam 2 MG tablet Commonly known as:  XANAX Take 2 mg by mouth 2 (two) times daily.   budesonide-formoterol 160-4.5 MCG/ACT inhaler Commonly known as:  SYMBICORT Inhale two puffs twice daily with spacer to  prevent cough or wheeze.  Rinse, gargle, and spit after use.   CARAFATE 1 g tablet Generic drug:  sucralfate   citalopram 40 MG tablet Commonly known as:  CELEXA TAKE 1 TABLET BY MOUTH DAILY (REPLACING TRINTELLIX)   dicyclomine 20 MG tablet Commonly known as:  BENTYL Take 20 mg by mouth 3 (three) times daily as needed.   famotidine 40 MG tablet Commonly known as:  PEPCID Take 40 mg by mouth 2 (two) times daily.   fluticasone 50 MCG/ACT nasal spray Commonly known as:  FLONASE Place 2 sprays into both nostrils daily.   gabapentin 800 MG tablet Commonly known as:  NEURONTIN Take 800 mg by mouth at bedtime.   HYDROcodone-acetaminophen 5-325 MG tablet Commonly known as:  NORCO/VICODIN Take 1 tablet by mouth every 6 (six) hours as needed. for pain   ipratropium 0.02 % nebulizer solution Commonly known as:  ATROVENT Can inhale the contents of one vial in nebulizer every four to six hours as needed for cough or wheeze.  Mix with Levalbuterol.   levalbuterol 1.25 MG/3ML nebulizer solution Commonly known as:  XOPENEX Use one vial in the nebulizer every 4-6 hours if needed for cough or wheeze.  Mix with Ipratropium as directed.   loratadine 10 MG tablet Commonly known as:  CLARITIN Take 10 mg by mouth daily.   methocarbamol 500 MG tablet Commonly known as:  ROBAXIN Take 1,000 mg by mouth 4 (four) times daily.   montelukast 10 MG tablet Commonly known as:  SINGULAIR  Take 1 tablet (10 mg total) by mouth daily.   pantoprazole 40 MG tablet Commonly known as:  PROTONIX Take 1 tablet (40 mg total) by mouth every morning.       Past Medical History:  Diagnosis Date  . Anxiety   . Asthma   . Bartholin cyst LEFT SIDE  . Bicornuate uterus S/P EXCISION RIGHT UTERUS HORN 2007  . Depression   . GERD (gastroesophageal reflux disease)   . Headache    sleep, xanax  . History of ovarian cyst   . PTSD (post-traumatic stress disorder)   . Schizophrenia [facility])     Past Surgical  History:  Procedure Laterality Date  . APPENDECTOMY    . BARTHOLIN CYST MARSUPIALIZATION  07/18/2012   Procedure: BARTHOLIN CYST MARSUPIALIZATION;  Surgeon: Ok Edwards, MD;  Location: [facility];  Service: Gynecology;  Laterality: Left;  Left Bartholin Duct Marsupilization. One hour.   Marland Kitchen BARTHOLIN GLAND CYST EXCISION Left 02/11/2015   Procedure: BARTHOLIN GLAND EXCISION;  Surgeon: Mitchel Honour, DO;  Location: [facility];  Service: Gynecology;  Laterality: Left;  . DIAGNOSTIC LAPAROSCOPY    . LACERATION REPAIR Left 05/29/2016   Procedure: REPAIR MULTIPLE LACERATIONS;  Surgeon: Mack Hook, MD;  Location: [facility];  Service: Orthopedics;  Laterality: Left;  laceration left forearm   . LAPAROSCOPIC APPENDECTOMY AND RIGHT OVARIAN CYSTECTOMY  09-04-2004  . LAPAROSCOPIC EXCISION HYPOPLASTIC RIGHT UTERINE HORN AND RIGHT SALPINGOOPHECTOMY  03-18-2006  . TENDON REPAIR Left 05/29/2016   Procedure: TENDON REPAIR;  Surgeon: Mack Hook, MD;  Location: [facility];  Service: Orthopedics;  Laterality: Left;  . WISDOM TOOTH EXTRACTION      Review of systems negative except as noted in HPI / PMHx or noted below:  Review of Systems  Constitutional: Negative.   HENT: Negative.   Eyes: Negative.   Respiratory: Negative.   Cardiovascular: Negative.   Gastrointestinal: Negative.   Genitourinary: Negative.   Musculoskeletal: Negative.   Skin: Negative.   Neurological: Negative.   Endo/Heme/Allergies: Negative.   Psychiatric/Behavioral: Negative.       Objective:   Vitals:   12/01/18 1640  BP: 116/78  Pulse: 88  Resp: 20          Physical Exam  HENT:  Head: Normocephalic.  Right Ear: Tympanic membrane, external ear and ear canal normal.  Left Ear: Tympanic membrane, external ear and ear canal normal.  Nose: Nose normal. No mucosal edema or rhinorrhea.  Mouth/Throat: Uvula is midline, oropharynx is clear and moist and mucous membranes  are normal. No oropharyngeal exudate.  Eyes: Conjunctivae are normal.  Neck: Trachea normal. No tracheal tenderness present. No tracheal deviation present. No thyromegaly present.  Cardiovascular: Normal rate, regular rhythm, S1 normal, S2 normal and normal heart sounds.  No murmur heard. Pulmonary/Chest: No stridor. No respiratory distress. She has wheezes (Expiratory wheeze bilateral posterior lung fields). She has no rales.  Musculoskeletal: She exhibits no edema.  Lymphadenopathy:       Head (right side): No tonsillar adenopathy present.       Head (left side): No tonsillar adenopathy present.    She has no cervical adenopathy.  Neurological: She is alert.  Skin: No rash noted. She is not diaphoretic. No erythema. Nails show no clubbing.    Diagnostics:    Spirometry was performed and demonstrated an FEV1 of 2.18 at 71 % of predicted.  The patient had an Asthma Control Test with the following results: ACT Total Score: 14.  Results of blood tests obtained 04 November 2018 identified WBC 6.7, absolute eosinophil 100, absolute lymphocyte 2900, hemoglobin 14.7, platelet 275, IgE 93 IU/mL, IgE antibodies directed against house dust mite, cockroach, and ragweed, IgG 855 mg/DL, IgM 004 mg/DL, IgA 599 mg/DL.  Review of skin testing obtained 18 September 2017 identified hypersensitivity against house dust mite and cat and dog.  Assessment and Plan:   1. Asthma, severe persistent, well-controlled   2. Other allergic rhinitis   3. LPRD (laryngopharyngeal reflux disease)   4. Migraine syndrome   5. Heavy tobacco smoker >10 cigarettes per day       1. Continue to perform Allergen avoidance measures - House dust mite  2. Continue to consider nicotine substitutes to replace tobacco smoke.    3. Continue toTreat and prevent inflammation:   A. Symbicort 160 2 inhalations 2 times per day with spacer  B. Flonase - one spray each nostril 2 times per day  C. montelukast 10 mg - one tablet  one time per day   4. Continue to Treat and prevent reflux:   A.  pantoprazole 40 mg 2 times per day  B.  Famotidine 40 mg 2 times per day  C.  Carafate slurry as directed  5. Continue to Treat and prevent headaches:   A. Continue to eliminate caffeine / chocolate  6. If needed:   A. nasal saline wash  B. Claritin 10 mg tablet 1 time per day  C. ProAir HFA 2 puffs or Xopenex + Ipratropium nebulizer every 4-6 hours.    7. Start Immunotherapy  8. Start Xolair   9.  Return to clinic in 12 weeks or earlier if problem  I am going to start Leming on a course of immunotherapy and omalizumab in an attempt to get her inflammation of her airway under better control and hopefully to give her some long-acting and persistent immunological changes that will result in less inflammation of her airway.  Of course, she will continue to use a collection of anti-inflammatory agents for her airway as noted above and I have encouraged her once again to stop smoking.  She will also continue to aggressively treat her reflux and remain away from caffeine to address the issue with her headaches.  I will see her back in this clinic in 12 weeks or earlier if there is a problem.  Laurette Schimke, MD Allergy / Immunology [facility]

## 2018-12-02 ENCOUNTER — Encounter: Payer: Self-pay | Admitting: Allergy and Immunology

## 2018-12-02 ENCOUNTER — Other Ambulatory Visit: Payer: Self-pay | Admitting: Allergy and Immunology

## 2018-12-02 DIAGNOSIS — J3081 Allergic rhinitis due to animal (cat) (dog) hair and dander: Secondary | ICD-10-CM | POA: Diagnosis not present

## 2018-12-02 DIAGNOSIS — J3089 Other allergic rhinitis: Secondary | ICD-10-CM

## 2018-12-02 NOTE — Progress Notes (Signed)
VIALS EXP 12-03-19 

## 2018-12-03 DIAGNOSIS — J3089 Other allergic rhinitis: Secondary | ICD-10-CM | POA: Diagnosis not present

## 2018-12-08 ENCOUNTER — Ambulatory Visit (INDEPENDENT_AMBULATORY_CARE_PROVIDER_SITE_OTHER): Payer: Medicare Other | Admitting: *Deleted

## 2018-12-08 DIAGNOSIS — J4550 Severe persistent asthma, uncomplicated: Secondary | ICD-10-CM

## 2018-12-08 MED ORDER — OMALIZUMAB 150 MG ~~LOC~~ SOLR
150.0000 mg | SUBCUTANEOUS | Status: DC
  Administered 2018-12-08 – 2019-07-15 (×3): 150 mg via SUBCUTANEOUS

## 2018-12-08 MED ORDER — EPINEPHRINE 0.3 MG/0.3ML IJ SOAJ: INTRAMUSCULAR | 3 refills | Status: DC

## 2018-12-09 ENCOUNTER — Other Ambulatory Visit: Payer: Self-pay | Admitting: Neurosurgery

## 2018-12-09 DIAGNOSIS — M5126 Other intervertebral disc displacement, lumbar region: Secondary | ICD-10-CM | POA: Diagnosis not present

## 2018-12-12 ENCOUNTER — Telehealth: Payer: Self-pay | Admitting: *Deleted

## 2018-12-12 MED ORDER — NYSTATIN 100000 UNIT/ML MT SUSP: OROMUCOSAL | 0 refills | Status: DC

## 2018-12-12 NOTE — Telephone Encounter (Signed)
Ok please send in Nystatin 100,000 units/ml to take 77ml 3-4 times a day (swish and swallow) x 7 days

## 2018-12-12 NOTE — Telephone Encounter (Signed)
Patient called and advised that she has thrush and requesting Nystatin to treat same to be sent to [facility]

## 2018-12-12 NOTE — Telephone Encounter (Signed)
Rx sent. Called and advised patient

## 2018-12-12 NOTE — Addendum Note (Signed)
Addended by: Devoria Glassing on: 12/12/2018 04:04 PM   Modules accepted: Orders

## 2018-12-15 ENCOUNTER — Ambulatory Visit (INDEPENDENT_AMBULATORY_CARE_PROVIDER_SITE_OTHER): Payer: Medicare Other | Admitting: *Deleted

## 2018-12-15 DIAGNOSIS — J309 Allergic rhinitis, unspecified: Secondary | ICD-10-CM

## 2018-12-15 NOTE — Progress Notes (Signed)
Immunotherapy   Patient Details  Name: [name] MRN: [id number] Date of Birth: November 22, 1986  12/15/2018  [name]  Following schedule: B Frequency: 1-2 TIMES WEEKLY Epi-Pen: YES Consent signed and patient instructions given.   Redge Gainer 12/15/2018, 2:35 PM

## 2018-12-26 NOTE — Pre-Procedure Instructions (Signed)
[name]  12/26/2018      [facility] [address], [place] - [address] [place] [address] Phone: [phone] Fax: [phone]    Your procedure is scheduled on Monday, January 6th.  Report to [facility] Admitting at 11:40 A.M.  Call this number if you have problems the morning of surgery:  [phone]   Remember:  Do not eat or drink after midnight.    Take these medicines the morning of surgery with A SIP OF WATER  alprazolam (XANAX) citalopram (CELEXA) dicyclomine (BENTYL) famotidine (PEPCID) gabapentin (NEURONTIN)-as needed HYDROcodone-acetaminophen (NORCO/VICODIN)-as needed loratadine (CLARITIN) methocarbamol (ROBAXIN) montelukast (SINGULAIR)  pantoprazole (PROTONIX) albuterol (PROAIR HFA)-as needed; please bring inhaler with you to the hospital.  budesonide-formoterol (SYMBICORT)  Use nebulizer treatments if needed.   As of today, STOP taking any Aspirin (unless otherwise instructed by your surgeon), Aleve, Naproxen, Ibuprofen, Motrin, Advil, Goody's, BC's, all herbal medications, fish oil, and all vitamins.    Do not wear jewelry, make-up or nail polish.  Do not wear lotions, powders, or perfumes, or deodorant.  Do not shave 48 hours prior to surgery.  Do not bring valuables to the hospital.  [facility] is not responsible for any belongings or valuables.  Contacts, dentures, eyeglasses, hearing aids, body piercings, or bridgework may not be worn into surgery.  Leave your suitcase in the car.  After surgery it may be brought to your room.  For patients admitted to the hospital, discharge time will be determined by your treatment team.  Patients discharged the day of surgery will not be allowed to drive home.   Special instructions:   [place]- Preparing For Surgery  Before surgery, you can play an important role. Because skin is not sterile, your skin needs to be as free of germs as possible. You  can reduce the number of germs on your skin by washing with CHG (chlorahexidine gluconate) Soap before surgery.  CHG is an antiseptic cleaner which kills germs and bonds with the skin to continue killing germs even after washing.    Oral Hygiene is also important to reduce your risk of infection.  Remember - BRUSH YOUR TEETH THE MORNING OF SURGERY WITH YOUR REGULAR TOOTHPASTE  Please do not use if you have an allergy to CHG or antibacterial soaps. If your skin becomes reddened/irritated stop using the CHG.  Do not shave (including legs and underarms) for at least 48 hours prior to first CHG shower. It is OK to shave your face.  Please follow these instructions carefully.   1. Shower the NIGHT BEFORE SURGERY and the MORNING OF SURGERY with CHG.   2. If you chose to wash your hair, wash your hair first as usual with your normal shampoo.  3. After you shampoo, rinse your hair and body thoroughly to remove the shampoo.  4. Use CHG as you would any other liquid soap. You can apply CHG directly to the skin and wash gently with a scrungie or a clean washcloth.   5. Apply the CHG Soap to your body ONLY FROM THE NECK DOWN.  Do not use on open wounds or open sores. Avoid contact with your eyes, ears, mouth and genitals (private parts). Wash Face and genitals (private parts)  with your normal soap.  6. Wash thoroughly, paying special attention to the area where your surgery will be performed.  7. Thoroughly rinse your body with warm water from the neck down.  8.  DO NOT shower/wash with your normal soap after using and rinsing off the CHG Soap.  9. Pat yourself dry with a CLEAN TOWEL.  10. Wear CLEAN PAJAMAS to bed the night before surgery, wear comfortable clothes the morning of surgery  11. Place CLEAN SHEETS on your bed the night of your first shower and DO NOT SLEEP WITH PETS.    Day of Surgery: Shower as stated above Do not apply any deodorants/lotions.  Please wear clean clothes to the  hospital/surgery center.   Remember to brush your teeth WITH YOUR REGULAR TOOTHPASTE.   Please read over the following fact sheets that you were given.

## 2018-12-27 DIAGNOSIS — J069 Acute upper respiratory infection, unspecified: Secondary | ICD-10-CM | POA: Diagnosis not present

## 2018-12-27 DIAGNOSIS — J00 Acute nasopharyngitis [common cold]: Secondary | ICD-10-CM | POA: Diagnosis not present

## 2018-12-29 ENCOUNTER — Inpatient Hospital Stay (HOSPITAL_COMMUNITY)
Admission: RE | Admit: 2018-12-29 | Discharge: 2018-12-29 | Disposition: A | Discharge status: Home / Self Care | Payer: Self-pay | Source: Ambulatory Visit

## 2018-12-31 HISTORY — PX: LIGAMENT REPAIR: SHX5444

## 2019-01-02 NOTE — Pre-Procedure Instructions (Signed)
[name]  01/02/2019      [facility] [address], [place] - [address] [place] [address] Phone: [phone] Fax: [phone]    Your procedure is scheduled on January 12, 2019.  Report to [facility] Admitting at 1215 PM.  Call this number if you have problems the morning of surgery:  [phone]   Remember:  Do not eat or drink after midnight.    Take these medicines the morning of surgery with A SIP OF WATER  Protonix (pantoprazole) Montelukast (singulair) Methocarbamol (robaxin) Loratadine (claritin) Gabapentin (neurontin (if needed) Symbicort Inhaler Citalopram (celexa) Albuterol inhaler-if needed-bring inhaler with you Alprazolam (xanax) Nebulizers-if needed Hydrocodone-acetaminophen (norco)-if needed   7 days prior to surgery STOP taking any Aspirin (unless otherwise instructed by your surgeon), Aleve, Naproxen, Ibuprofen, Motrin, Advil, Goody's, BC's, all herbal medications, fish oil, and all vitamins    Do not wear jewelry, make-up or nail polish.  Do not wear lotions, powders, or perfumes, or deodorant.  Do not shave 48 hours prior to surgery.   Do not bring valuables to the hospital.  [facility] is not responsible for any belongings or valuables.  Contacts, dentures or bridgework may not be worn into surgery.  Leave your suitcase in the car.  After surgery it may be brought to your room.  For patients admitted to the hospital, discharge time will be determined by your treatment team.  Patients discharged the day of surgery will not be allowed to drive home.    [place]- Preparing For Surgery  Before surgery, you can play an important role. Because skin is not sterile, your skin needs to be as free of germs as possible. You can reduce the number of germs on your skin by washing with CHG (chlorahexidine gluconate) Soap before surgery.  CHG is an antiseptic cleaner which kills germs and bonds with  the skin to continue killing germs even after washing.    Oral Hygiene is also important to reduce your risk of infection.  Remember - BRUSH YOUR TEETH THE MORNING OF SURGERY WITH YOUR REGULAR TOOTHPASTE  Please do not use if you have an allergy to CHG or antibacterial soaps. If your skin becomes reddened/irritated stop using the CHG.  Do not shave (including legs and underarms) for at least 48 hours prior to first CHG shower. It is OK to shave your face.  Please follow these instructions carefully.   1. Shower the NIGHT BEFORE SURGERY and the MORNING OF SURGERY with CHG.   2. If you chose to wash your hair, wash your hair first as usual with your normal shampoo.  3. After you shampoo, rinse your hair and body thoroughly to remove the shampoo.  4. Use CHG as you would any other liquid soap. You can apply CHG directly to the skin and wash gently with a scrungie or a clean washcloth.   5. Apply the CHG Soap to your body ONLY FROM THE NECK DOWN.  Do not use on open wounds or open sores. Avoid contact with your eyes, ears, mouth and genitals (private parts). Wash Face and genitals (private parts)  with your normal soap.  6. Wash thoroughly, paying special attention to the area where your surgery will be performed.  7. Thoroughly rinse your body with warm water from the neck down.  8. DO NOT shower/wash with your normal soap after using and rinsing off the CHG Soap.  9. Pat yourself dry  with a CLEAN TOWEL.  10. Wear CLEAN PAJAMAS to bed the night before surgery, wear comfortable clothes the morning of surgery  11. Place CLEAN SHEETS on your bed the night of your first shower and DO NOT SLEEP WITH PETS.  Day of Surgery:  Do not apply any deodorants/lotions.  Please wear clean clothes to the hospital/surgery center.   Remember to brush your teeth WITH YOUR REGULAR TOOTHPASTE.   Please read over the following fact sheets that you were given.

## 2019-01-05 ENCOUNTER — Encounter (HOSPITAL_COMMUNITY)
Admission: RE | Admit: 2019-01-05 | Discharge: 2019-01-05 | Disposition: A | Discharge status: Home / Self Care | Payer: Medicare Other | Source: Ambulatory Visit | Attending: Neurosurgery | Admitting: Neurosurgery

## 2019-01-05 ENCOUNTER — Other Ambulatory Visit: Payer: Self-pay

## 2019-01-05 ENCOUNTER — Ambulatory Visit: Payer: Self-pay

## 2019-01-05 ENCOUNTER — Encounter (HOSPITAL_COMMUNITY): Payer: Self-pay

## 2019-01-05 DIAGNOSIS — F419 Anxiety disorder, unspecified: Secondary | ICD-10-CM | POA: Insufficient documentation

## 2019-01-05 DIAGNOSIS — F209 Schizophrenia, unspecified: Secondary | ICD-10-CM | POA: Diagnosis not present

## 2019-01-05 DIAGNOSIS — Z01812 Encounter for preprocedural laboratory examination: Secondary | ICD-10-CM | POA: Insufficient documentation

## 2019-01-05 DIAGNOSIS — Z79899 Other long term (current) drug therapy: Secondary | ICD-10-CM | POA: Insufficient documentation

## 2019-01-05 DIAGNOSIS — M5126 Other intervertebral disc displacement, lumbar region: Secondary | ICD-10-CM | POA: Insufficient documentation

## 2019-01-05 DIAGNOSIS — K219 Gastro-esophageal reflux disease without esophagitis: Secondary | ICD-10-CM | POA: Insufficient documentation

## 2019-01-05 DIAGNOSIS — F4310 Post-traumatic stress disorder, unspecified: Secondary | ICD-10-CM | POA: Insufficient documentation

## 2019-01-05 DIAGNOSIS — J45909 Unspecified asthma, uncomplicated: Secondary | ICD-10-CM | POA: Insufficient documentation

## 2019-01-05 DIAGNOSIS — G4733 Obstructive sleep apnea (adult) (pediatric): Secondary | ICD-10-CM | POA: Diagnosis not present

## 2019-01-05 HISTORY — DX: Sleep apnea, unspecified: G47.30

## 2019-01-05 LAB — BASIC METABOLIC PANEL
Anion gap: 11 (ref 5–15)
BUN: 10 mg/dL (ref 6–20)
CO2: 22 mmol/L (ref 22–32)
Calcium: 9.3 mg/dL (ref 8.9–10.3)
Chloride: 100 mmol/L (ref 98–111)
Creatinine, Ser: 0.9 mg/dL (ref 0.44–1.00)
GFR calc Af Amer: >60 mL/min (ref >60)
Glucose, Bld: 139 mg/dL — ABNORMAL HIGH (ref 70–99)
POTASSIUM: 4.7 mmol/L (ref 3.5–5.1)
SODIUM: 133 mmol/L — AB (ref 135–145)

## 2019-01-05 LAB — CBC
HCT: 41.9 % (ref 36.0–46.0)
Hemoglobin: 14.1 g/dL (ref 12.0–15.0)
MCH: 35.8 pg — AB (ref 26.0–34.0)
MCHC: 33.7 g/dL (ref 30.0–36.0)
MCV: 106.3 fL — AB (ref 80.0–100.0)
Platelets: 480 10*3/uL — ABNORMAL HIGH (ref 150–400)
RBC: 3.94 MIL/uL (ref 3.87–5.11)
RDW: 14 % (ref 11.5–15.5)
WBC: 13.1 10*3/uL — ABNORMAL HIGH (ref 4.0–10.5)
nRBC: 0 % (ref 0.0–0.2)

## 2019-01-05 LAB — SURGICAL PCR SCREEN
MRSA, PCR: NEGATIVE
STAPHYLOCOCCUS AUREUS: NEGATIVE

## 2019-01-05 NOTE — Progress Notes (Addendum)
PCP - Concepcion Elk, MD Cardiologist - Dr. Gwen Her  Chest x-ray - pt denies past year EKG - 07/10/18 -requested from [facility]  Stress Test - 10/24/15 in EPIC ECHO - 10/24/15 in EPIC  Cardiac Cath - 08/13/18 Care Everywhere- normal  Sleep Study - yes CPAP - yes  Fasting Blood Sugar - n/a Checks Blood Sugar _____ times a day-n/a  Blood Thinner Instructions: n/a Aspirin Instructions: n/a  Anesthesia review: Yes  Patient denies shortness of breath, fever, cough and chest pain at PAT appointment  Patient verbalized understanding of instructions that were given to them at the PAT appointment. Patient was also instructed that they will need to review over the PAT instructions again at home before surgery.

## 2019-01-06 ENCOUNTER — Encounter (HOSPITAL_COMMUNITY): Payer: Self-pay

## 2019-01-06 NOTE — Progress Notes (Addendum)
Anesthesia Chart Review:  Case:  [id number] Date/Time:  01/12/19 1400   Procedure:  Microdiscectomy - left - L3-L4 (Left Back)   Anesthesia type:  General   Pre-op diagnosis:  HNP   Location:  [facility] ROOM 20 / [facility] OR   Surgeon:  Donalee Citrin, MD      DISCUSSION: Patient is a 33 year old female scheduled for the above procedure.  History includes smoking, GERD, asthma, PTSD, schizophrenia, OSA (CPAP). CT concerning for RUL PNA 08/2018.  She had an echo and RHC in 2019 for evaluation of dyspnea. No chest pain. She was seen by pulmonology and cardiology. She had normal right heart pressures, LVEF, and no significant valvular abnormalities. Normal EKG. Normal stress echo in 10/2015. According to pulmonologist Dr. Jerre Simon (10/01/18), he felt dyspnea may be a combination of asthma, allergies, and anxiety. She is also a smoker.   Per PAT RN documentation, she denied SOB, chest pain, cough, and fever at PAT. Based on currently available information, I would anticipate that she can proceed as planned if no acute changes. She is for a urine pregnancy test on the day of surgery.   VS: BP (P) 110/62   Pulse (P) 87   Temp (!) (P) 36.3 C   Resp (P) 18   Ht 5\' 4"  (1.626 m)   Wt 72.1 kg   LMP 12/22/2018   SpO2 (P) 99%   BMI 27.29 kg/m     PROVIDERS: Charmayne Sheer, MD is PCP [facility] Everywhere). Gwen Her, MD is cardiologist [facility] Everywhere). He saw her for RHC in 06/2018 following referral from pulmonology. Right sided pressures were normal. Previously she saw Everette Rank, MD in 2013 and Tereso Newcomer, PA-C in 10/2015 both with [facility]. - Randolm Idol, MD is pulmonologist [facility] Everywhere). He felt patient's dyspnea likely a combination of asthma, allergies, and panic attacks. Last seen 10/01/18 for asthma and OSA follow-up. Previously she has seen pulmonologist Marcellus Scott, MD [facility]). Laurette Schimke, MD is allergist. Last visit  12/01/18.   LABS: Labs reviewed: Acceptable for surgery. Labs routed to Dr. Wynetta Emery for review given WBC 13.1K. Patient denied cough, fever at PAT. Defer additional recommendations, if any, to surgeon. (all labs ordered are listed, but only abnormal results are displayed)  Labs Reviewed  CBC - Abnormal; Notable for the following components:      Result Value   WBC 13.1 (*)    MCV 106.3 (*)    MCH 35.8 (*)    Platelets 480 (*)    All other components within normal limits  BASIC METABOLIC PANEL - Abnormal; Notable for the following components:   Sodium 133 (*)    Glucose, Bld 139 (*)    All other components within normal limits  SURGICAL PCR SCREEN    OTHER:  - Spirometry 12/01/18: FVC 2.87 (79%), FEV1 2.18 (71%), FEF 25-75% 1.91 (58%). Mild restriction. - Spirometry 10/01/18 [facility] Everywhere): The FEV1 and FVC are slightly reduced without obstruction.The total lung capacity is mildly reduced.The diffusing capacity is mildly reduced.   IMAGES: CTA chest 09/28/18 [facility]): Impression: 1.  No pulmonary embolus. 2.  Mild patchy areas of airspace disease in the right upper lobe concerning for pneumonia.   EKG: 07/10/18 [facility]): SR. Normal ECG.   CV: RHC 08/13/18 (as part of dyspnea work-up; [facility] Everywhere): RESULTS:  Hemodynamics:  Pulmonary capillary wedge pressure= 9 mmHg PA pressure= 26/10 mmHg RV pressure= 27/5 mmHg RA pressure  8 mmHg  CONCLUSIONS: 1. Normal right-sided pressures.   Echo 05/23/18 [facility]):  Conclusions: 1.  Normal right heart structures.  No significant tricuspid regurgitation to estimate pressure. 2.  Normal left ventricular wall thickness. The left ventricular cavity is minimally enlarged but the contractility is normal with estimated EF 55%.  Normal diastolic filling. 3.  The left atrium is slightly enlarged as measured by volume. 4.  Trileaflet aortic valve without aortic stenosis or aortic  insufficiency. 5.  Mild mitral regurgitation. 6.  No pericardial effusion.  Stress echo 10/24/15: Impressions:  Normal study after maximal exercise.   Past Medical History:  Diagnosis Date  . Anxiety   . Asthma   . Bartholin cyst LEFT SIDE  . Bicornuate uterus S/P EXCISION RIGHT UTERUS HORN 2007  . Depression   . GERD (gastroesophageal reflux disease)   . Headache    sleep, xanax  . History of ovarian cyst   . PTSD (post-traumatic stress disorder)   . Schizophrenia [facility])     Past Surgical History:  Procedure Laterality Date  . APPENDECTOMY    . BARTHOLIN CYST MARSUPIALIZATION  07/18/2012   Procedure: BARTHOLIN CYST MARSUPIALIZATION;  Surgeon: Ok Edwards, MD;  Location: [facility];  Service: Gynecology;  Laterality: Left;  Left Bartholin Duct Marsupilization. One hour.   Marland Kitchen BARTHOLIN GLAND CYST EXCISION Left 02/11/2015   Procedure: BARTHOLIN GLAND EXCISION;  Surgeon: Mitchel Honour, DO;  Location: [facility];  Service: Gynecology;  Laterality: Left;  . CARDIAC CATHETERIZATION    . DIAGNOSTIC LAPAROSCOPY    . LACERATION REPAIR Left 05/29/2016   Procedure: REPAIR MULTIPLE LACERATIONS;  Surgeon: Mack Hook, MD;  Location: [facility];  Service: Orthopedics;  Laterality: Left;  laceration left forearm   . LAPAROSCOPIC APPENDECTOMY AND RIGHT OVARIAN CYSTECTOMY  09-04-2004  . LAPAROSCOPIC EXCISION HYPOPLASTIC RIGHT UTERINE HORN AND RIGHT SALPINGOOPHECTOMY  03-18-2006  . TENDON REPAIR Left 05/29/2016   Procedure: TENDON REPAIR;  Surgeon: Mack Hook, MD;  Location: [facility];  Service: Orthopedics;  Laterality: Left;  . WISDOM TOOTH EXTRACTION      MEDICATIONS: . albuterol (PROAIR HFA) 108 (90 Base) MCG/ACT inhaler  . alprazolam (XANAX) 2 MG tablet  . budesonide-formoterol (SYMBICORT) 160-4.5 MCG/ACT inhaler  . citalopram (CELEXA) 40 MG tablet  . dicyclomine (BENTYL) 20 MG tablet  . EPINEPHrine 0.3 mg/0.3 mL IJ SOAJ injection   . famotidine (PEPCID) 40 MG tablet  . gabapentin (NEURONTIN) 800 MG tablet  . HYDROcodone-acetaminophen (NORCO/VICODIN) 5-325 MG tablet  . ipratropium (ATROVENT) 0.02 % nebulizer solution  . levalbuterol (XOPENEX) 1.25 MG/3ML nebulizer solution  . loratadine (CLARITIN) 10 MG tablet  . methocarbamol (ROBAXIN) 500 MG tablet  . montelukast (SINGULAIR) 10 MG tablet  . nystatin (MYCOSTATIN) 100000 UNIT/ML suspension  . pantoprazole (PROTONIX) 40 MG tablet  . sucralfate (CARAFATE) 1 g tablet   . omalizumab Geoffry Paradise) injection 150 mg    Shonna Chock, PA-C Surgical Short Stay/Anesthesiology [facility] Phone [phone] [facility] Phone [phone] 01/06/2019 5:09 PM

## 2019-01-08 ENCOUNTER — Ambulatory Visit: Payer: Self-pay

## 2019-01-08 ENCOUNTER — Ambulatory Visit (INDEPENDENT_AMBULATORY_CARE_PROVIDER_SITE_OTHER): Payer: Medicare Other | Admitting: *Deleted

## 2019-01-08 DIAGNOSIS — J309 Allergic rhinitis, unspecified: Secondary | ICD-10-CM

## 2019-01-08 DIAGNOSIS — S6291XA Unspecified fracture of right wrist and hand, initial encounter for closed fracture: Secondary | ICD-10-CM | POA: Diagnosis not present

## 2019-01-08 DIAGNOSIS — S62501A Fracture of unspecified phalanx of right thumb, initial encounter for closed fracture: Secondary | ICD-10-CM | POA: Diagnosis not present

## 2019-01-08 DIAGNOSIS — S62511A Displaced fracture of proximal phalanx of right thumb, initial encounter for closed fracture: Secondary | ICD-10-CM | POA: Diagnosis not present

## 2019-01-08 DIAGNOSIS — S62619A Displaced fracture of proximal phalanx of unspecified finger, initial encounter for closed fracture: Secondary | ICD-10-CM | POA: Diagnosis not present

## 2019-01-09 DIAGNOSIS — S62511A Displaced fracture of proximal phalanx of right thumb, initial encounter for closed fracture: Secondary | ICD-10-CM | POA: Diagnosis not present

## 2019-01-12 ENCOUNTER — Ambulatory Visit (HOSPITAL_COMMUNITY): Payer: Medicare Other

## 2019-01-12 ENCOUNTER — Ambulatory Visit (HOSPITAL_COMMUNITY)
Admission: RE | Admit: 2019-01-12 | Discharge: 2019-01-13 | Disposition: A | Discharge status: Home / Self Care | Payer: Medicare Other | Attending: Neurosurgery | Admitting: Neurosurgery

## 2019-01-12 ENCOUNTER — Encounter (HOSPITAL_COMMUNITY): Payer: Self-pay

## 2019-01-12 ENCOUNTER — Ambulatory Visit (HOSPITAL_COMMUNITY): Payer: Medicare Other | Admitting: Certified Registered Nurse Anesthetist

## 2019-01-12 ENCOUNTER — Encounter (HOSPITAL_COMMUNITY)
Admission: RE | Disposition: A | Discharge status: Home / Self Care | Payer: Self-pay | Source: Home / Self Care | Attending: Neurosurgery

## 2019-01-12 ENCOUNTER — Other Ambulatory Visit: Payer: Self-pay

## 2019-01-12 ENCOUNTER — Ambulatory Visit (HOSPITAL_COMMUNITY): Payer: Medicare Other | Admitting: Vascular Surgery

## 2019-01-12 DIAGNOSIS — Z8489 Family history of other specified conditions: Secondary | ICD-10-CM | POA: Diagnosis not present

## 2019-01-12 DIAGNOSIS — Z833 Family history of diabetes mellitus: Secondary | ICD-10-CM | POA: Diagnosis not present

## 2019-01-12 DIAGNOSIS — F419 Anxiety disorder, unspecified: Secondary | ICD-10-CM | POA: Insufficient documentation

## 2019-01-12 DIAGNOSIS — G4730 Sleep apnea, unspecified: Secondary | ICD-10-CM | POA: Diagnosis not present

## 2019-01-12 DIAGNOSIS — Z818 Family history of other mental and behavioral disorders: Secondary | ICD-10-CM | POA: Diagnosis not present

## 2019-01-12 DIAGNOSIS — M5116 Intervertebral disc disorders with radiculopathy, lumbar region: Secondary | ICD-10-CM | POA: Diagnosis not present

## 2019-01-12 DIAGNOSIS — Z79899 Other long term (current) drug therapy: Secondary | ICD-10-CM | POA: Insufficient documentation

## 2019-01-12 DIAGNOSIS — F329 Major depressive disorder, single episode, unspecified: Secondary | ICD-10-CM | POA: Diagnosis not present

## 2019-01-12 DIAGNOSIS — Z803 Family history of malignant neoplasm of breast: Secondary | ICD-10-CM | POA: Diagnosis not present

## 2019-01-12 DIAGNOSIS — Z800 Family history of malignant neoplasm of digestive organs: Secondary | ICD-10-CM | POA: Diagnosis not present

## 2019-01-12 DIAGNOSIS — M4726 Other spondylosis with radiculopathy, lumbar region: Secondary | ICD-10-CM | POA: Insufficient documentation

## 2019-01-12 DIAGNOSIS — F4310 Post-traumatic stress disorder, unspecified: Secondary | ICD-10-CM | POA: Insufficient documentation

## 2019-01-12 DIAGNOSIS — Z419 Encounter for procedure for purposes other than remedying health state, unspecified: Secondary | ICD-10-CM

## 2019-01-12 DIAGNOSIS — Z8249 Family history of ischemic heart disease and other diseases of the circulatory system: Secondary | ICD-10-CM | POA: Diagnosis not present

## 2019-01-12 DIAGNOSIS — F17210 Nicotine dependence, cigarettes, uncomplicated: Secondary | ICD-10-CM | POA: Insufficient documentation

## 2019-01-12 DIAGNOSIS — K219 Gastro-esophageal reflux disease without esophagitis: Secondary | ICD-10-CM | POA: Diagnosis not present

## 2019-01-12 DIAGNOSIS — M5126 Other intervertebral disc displacement, lumbar region: Secondary | ICD-10-CM | POA: Diagnosis present

## 2019-01-12 DIAGNOSIS — J45909 Unspecified asthma, uncomplicated: Secondary | ICD-10-CM | POA: Diagnosis not present

## 2019-01-12 DIAGNOSIS — Z811 Family history of alcohol abuse and dependence: Secondary | ICD-10-CM | POA: Insufficient documentation

## 2019-01-12 DIAGNOSIS — M4326 Fusion of spine, lumbar region: Secondary | ICD-10-CM | POA: Diagnosis not present

## 2019-01-12 DIAGNOSIS — F209 Schizophrenia, unspecified: Secondary | ICD-10-CM | POA: Insufficient documentation

## 2019-01-12 DIAGNOSIS — R51 Headache: Secondary | ICD-10-CM | POA: Insufficient documentation

## 2019-01-12 HISTORY — DX: Fracture of unspecified phalanx of unspecified thumb, initial encounter for closed fracture: S62.509A

## 2019-01-12 HISTORY — PX: LUMBAR LAMINECTOMY/DECOMPRESSION MICRODISCECTOMY: SHX5026

## 2019-01-12 LAB — POCT PREGNANCY, URINE: Preg Test, Ur: NEGATIVE

## 2019-01-12 SURGERY — LUMBAR LAMINECTOMY/DECOMPRESSION MICRODISCECTOMY 1 LEVEL
Anesthesia: General | Site: Back | Laterality: Left

## 2019-01-12 MED ORDER — FENTANYL CITRATE (PF) 100 MCG/2ML IJ SOLN
INTRAMUSCULAR | Status: DC | PRN
  Administered 2019-01-12: 150 ug via INTRAVENOUS

## 2019-01-12 MED ORDER — DEXAMETHASONE SODIUM PHOSPHATE 10 MG/ML IJ SOLN
INTRAMUSCULAR | Status: AC
  Filled 2019-01-12: qty 1

## 2019-01-12 MED ORDER — LORATADINE 10 MG PO TABS
10.0000 mg | ORAL_TABLET | Freq: Every day | ORAL | Status: DC
  Administered 2019-01-13: 10 mg via ORAL
  Filled 2019-01-12 (×2): qty 1

## 2019-01-12 MED ORDER — CEFAZOLIN SODIUM-DEXTROSE 2-4 GM/100ML-% IV SOLN
INTRAVENOUS | Status: AC
  Filled 2019-01-12: qty 100

## 2019-01-12 MED ORDER — PANTOPRAZOLE SODIUM 40 MG IV SOLR: 40.0000 mg | Freq: Every day | INTRAVENOUS | Status: DC

## 2019-01-12 MED ORDER — SUGAMMADEX SODIUM 200 MG/2ML IV SOLN
INTRAVENOUS | Status: DC | PRN
  Administered 2019-01-12: 140.6 mg via INTRAVENOUS

## 2019-01-12 MED ORDER — BUPIVACAINE HCL (PF) 0.25 % IJ SOLN
INTRAMUSCULAR | Status: AC
  Filled 2019-01-12: qty 30

## 2019-01-12 MED ORDER — LACTATED RINGERS IV SOLN
INTRAVENOUS | Status: DC
  Administered 2019-01-12: 10:00:00 via INTRAVENOUS

## 2019-01-12 MED ORDER — SODIUM CHLORIDE 0.9% FLUSH: 3.0000 mL | INTRAVENOUS | Status: DC | PRN

## 2019-01-12 MED ORDER — OXYCODONE HCL 5 MG PO TABS: 5.0000 mg | ORAL_TABLET | Freq: Once | ORAL | Status: DC | PRN

## 2019-01-12 MED ORDER — LIDOCAINE 2% (20 MG/ML) 5 ML SYRINGE
INTRAMUSCULAR | Status: AC
  Filled 2019-01-12: qty 5

## 2019-01-12 MED ORDER — HYDROMORPHONE HCL 1 MG/ML IJ SOLN
0.5000 mg | INTRAMUSCULAR | Status: DC | PRN
  Administered 2019-01-12: 0.5 mg via INTRAVENOUS
  Filled 2019-01-12: qty 0.5

## 2019-01-12 MED ORDER — HYDROMORPHONE HCL 1 MG/ML IJ SOLN
INTRAMUSCULAR | Status: AC
  Administered 2019-01-12: 0.5 mg via INTRAVENOUS
  Filled 2019-01-12: qty 1

## 2019-01-12 MED ORDER — NYSTATIN 100000 UNIT/ML MT SUSP
5.0000 mL | Freq: Four times a day (QID) | OROMUCOSAL | Status: DC | PRN
  Administered 2019-01-12: 500000 [IU] via ORAL
  Filled 2019-01-12 (×2): qty 5

## 2019-01-12 MED ORDER — ONDANSETRON HCL 4 MG/2ML IJ SOLN
INTRAMUSCULAR | Status: DC | PRN
  Administered 2019-01-12: 4 mg via INTRAVENOUS

## 2019-01-12 MED ORDER — SODIUM CHLORIDE 0.9% FLUSH
3.0000 mL | Freq: Two times a day (BID) | INTRAVENOUS | Status: DC
  Administered 2019-01-12: 3 mL via INTRAVENOUS

## 2019-01-12 MED ORDER — GABAPENTIN 800 MG PO TABS
800.0000 mg | ORAL_TABLET | Freq: Two times a day (BID) | ORAL | Status: DC | PRN
  Filled 2019-01-12: qty 1

## 2019-01-12 MED ORDER — PHENYLEPHRINE 40 MCG/ML (10ML) SYRINGE FOR IV PUSH (FOR BLOOD PRESSURE SUPPORT)
PREFILLED_SYRINGE | INTRAVENOUS | Status: AC
  Filled 2019-01-12: qty 10

## 2019-01-12 MED ORDER — ONDANSETRON HCL 4 MG/2ML IJ SOLN: 4.0000 mg | Freq: Four times a day (QID) | INTRAMUSCULAR | Status: DC | PRN

## 2019-01-12 MED ORDER — SUCRALFATE 1 G PO TABS
1.0000 g | ORAL_TABLET | Freq: Three times a day (TID) | ORAL | Status: DC
  Administered 2019-01-12 – 2019-01-13 (×3): 1 g via ORAL
  Filled 2019-01-12 (×4): qty 1

## 2019-01-12 MED ORDER — PHENYLEPHRINE HCL 10 MG/ML IJ SOLN
INTRAMUSCULAR | Status: DC | PRN
  Administered 2019-01-12 (×2): 40 ug via INTRAVENOUS
  Administered 2019-01-12: 80 ug via INTRAVENOUS

## 2019-01-12 MED ORDER — CHLORHEXIDINE GLUCONATE CLOTH 2 % EX PADS: 6.0000 | MEDICATED_PAD | Freq: Once | CUTANEOUS | Status: DC

## 2019-01-12 MED ORDER — FAMOTIDINE 20 MG PO TABS
40.0000 mg | ORAL_TABLET | Freq: Two times a day (BID) | ORAL | Status: DC
  Administered 2019-01-12 – 2019-01-13 (×2): 40 mg via ORAL
  Filled 2019-01-12 (×3): qty 2

## 2019-01-12 MED ORDER — PROPOFOL 10 MG/ML IV BOLUS
INTRAVENOUS | Status: DC | PRN
  Administered 2019-01-12: 200 mg via INTRAVENOUS
  Administered 2019-01-12: 10 mg via INTRAVENOUS
  Administered 2019-01-12 (×2): 20 mg via INTRAVENOUS

## 2019-01-12 MED ORDER — ROCURONIUM BROMIDE 50 MG/5ML IV SOSY
PREFILLED_SYRINGE | INTRAVENOUS | Status: DC | PRN
  Administered 2019-01-12: 50 mg via INTRAVENOUS

## 2019-01-12 MED ORDER — OXYCODONE HCL 5 MG PO TABS
ORAL_TABLET | ORAL | Status: AC
  Filled 2019-01-12: qty 2

## 2019-01-12 MED ORDER — METHOCARBAMOL 500 MG PO TABS
ORAL_TABLET | ORAL | Status: AC
  Filled 2019-01-12: qty 1

## 2019-01-12 MED ORDER — THROMBIN 5000 UNITS EX SOLR
CUTANEOUS | Status: AC
  Filled 2019-01-12: qty 15000

## 2019-01-12 MED ORDER — ROCURONIUM BROMIDE 50 MG/5ML IV SOSY
PREFILLED_SYRINGE | INTRAVENOUS | Status: AC
  Filled 2019-01-12: qty 5

## 2019-01-12 MED ORDER — OXYCODONE HCL 5 MG/5ML PO SOLN: 5.0000 mg | Freq: Once | ORAL | Status: DC | PRN

## 2019-01-12 MED ORDER — MOMETASONE FURO-FORMOTEROL FUM 200-5 MCG/ACT IN AERO
2.0000 | INHALATION_SPRAY | Freq: Two times a day (BID) | RESPIRATORY_TRACT | Status: DC
  Filled 2019-01-12 (×2): qty 8.8

## 2019-01-12 MED ORDER — ACETAMINOPHEN 650 MG RE SUPP: 650.0000 mg | RECTAL | Status: DC | PRN

## 2019-01-12 MED ORDER — 0.9 % SODIUM CHLORIDE (POUR BTL) OPTIME
TOPICAL | Status: DC | PRN
  Administered 2019-01-12: 1000 mL

## 2019-01-12 MED ORDER — MIDAZOLAM HCL 5 MG/5ML IJ SOLN
INTRAMUSCULAR | Status: DC | PRN
  Administered 2019-01-12: 2 mg via INTRAVENOUS

## 2019-01-12 MED ORDER — LIDOCAINE-EPINEPHRINE 1 %-1:100000 IJ SOLN
INTRAMUSCULAR | Status: AC
  Filled 2019-01-12: qty 1

## 2019-01-12 MED ORDER — FENTANYL CITRATE (PF) 250 MCG/5ML IJ SOLN
INTRAMUSCULAR | Status: AC
  Filled 2019-01-12: qty 5

## 2019-01-12 MED ORDER — MIDAZOLAM HCL 2 MG/2ML IJ SOLN
INTRAMUSCULAR | Status: AC
  Filled 2019-01-12: qty 2

## 2019-01-12 MED ORDER — KETOROLAC TROMETHAMINE 30 MG/ML IJ SOLN
INTRAMUSCULAR | Status: DC | PRN
  Administered 2019-01-12: 30 mg via INTRAVENOUS

## 2019-01-12 MED ORDER — ACETAMINOPHEN 325 MG PO TABS
650.0000 mg | ORAL_TABLET | ORAL | Status: DC | PRN
  Administered 2019-01-13: 650 mg via ORAL
  Filled 2019-01-12: qty 2

## 2019-01-12 MED ORDER — ALBUTEROL SULFATE HFA 108 (90 BASE) MCG/ACT IN AERS: 2.0000 | INHALATION_SPRAY | Freq: Four times a day (QID) | RESPIRATORY_TRACT | Status: DC | PRN

## 2019-01-12 MED ORDER — CEFAZOLIN SODIUM-DEXTROSE 2-4 GM/100ML-% IV SOLN
2.0000 g | INTRAVENOUS | Status: AC
  Administered 2019-01-12: 2 g via INTRAVENOUS

## 2019-01-12 MED ORDER — IPRATROPIUM BROMIDE 0.02 % IN SOLN
0.2500 mg | RESPIRATORY_TRACT | Status: DC | PRN
  Filled 2019-01-12: qty 2.5

## 2019-01-12 MED ORDER — OXYCODONE HCL 5 MG PO TABS
10.0000 mg | ORAL_TABLET | ORAL | Status: DC | PRN
  Administered 2019-01-12 – 2019-01-13 (×7): 10 mg via ORAL
  Filled 2019-01-12 (×6): qty 2

## 2019-01-12 MED ORDER — PROPOFOL 10 MG/ML IV BOLUS
INTRAVENOUS | Status: AC
  Filled 2019-01-12: qty 40

## 2019-01-12 MED ORDER — DICYCLOMINE HCL 20 MG PO TABS
20.0000 mg | ORAL_TABLET | Freq: Three times a day (TID) | ORAL | Status: DC
  Administered 2019-01-12 – 2019-01-13 (×2): 20 mg via ORAL
  Filled 2019-01-12 (×4): qty 1

## 2019-01-12 MED ORDER — DEXAMETHASONE SODIUM PHOSPHATE 10 MG/ML IJ SOLN
10.0000 mg | INTRAMUSCULAR | Status: AC
  Administered 2019-01-12: 10 mg via INTRAVENOUS

## 2019-01-12 MED ORDER — NICOTINE 21 MG/24HR TD PT24
21.0000 mg | MEDICATED_PATCH | Freq: Every day | TRANSDERMAL | Status: DC
  Administered 2019-01-12 – 2019-01-13 (×2): 21 mg via TRANSDERMAL
  Filled 2019-01-12 (×2): qty 1

## 2019-01-12 MED ORDER — ALPRAZOLAM 0.5 MG PO TABS
2.0000 mg | ORAL_TABLET | Freq: Two times a day (BID) | ORAL | Status: DC
  Administered 2019-01-12 (×2): 2 mg via ORAL
  Filled 2019-01-12 (×2): qty 4

## 2019-01-12 MED ORDER — GABAPENTIN 400 MG PO CAPS
800.0000 mg | ORAL_CAPSULE | Freq: Two times a day (BID) | ORAL | Status: DC | PRN
  Administered 2019-01-12: 800 mg via ORAL
  Filled 2019-01-12: qty 2

## 2019-01-12 MED ORDER — ONDANSETRON HCL 4 MG PO TABS: 4.0000 mg | ORAL_TABLET | Freq: Four times a day (QID) | ORAL | Status: DC | PRN

## 2019-01-12 MED ORDER — LIDOCAINE HCL (CARDIAC) PF 100 MG/5ML IV SOSY
PREFILLED_SYRINGE | INTRAVENOUS | Status: DC | PRN
  Administered 2019-01-12: 100 mg via INTRAVENOUS

## 2019-01-12 MED ORDER — MONTELUKAST SODIUM 10 MG PO TABS
10.0000 mg | ORAL_TABLET | Freq: Every day | ORAL | Status: DC
  Administered 2019-01-13: 10 mg via ORAL
  Filled 2019-01-12 (×2): qty 1

## 2019-01-12 MED ORDER — ONDANSETRON HCL 4 MG/2ML IJ SOLN
INTRAMUSCULAR | Status: AC
  Filled 2019-01-12: qty 2

## 2019-01-12 MED ORDER — ALBUTEROL SULFATE (2.5 MG/3ML) 0.083% IN NEBU: 2.5000 mg | INHALATION_SOLUTION | RESPIRATORY_TRACT | Status: DC | PRN

## 2019-01-12 MED ORDER — HYDROCODONE-ACETAMINOPHEN 5-325 MG PO TABS: 1.0000 | ORAL_TABLET | Freq: Four times a day (QID) | ORAL | Status: DC | PRN

## 2019-01-12 MED ORDER — EPINEPHRINE 0.3 MG/0.3ML IJ SOAJ: 0.3000 mg | Freq: Every day | INTRAMUSCULAR | Status: DC | PRN

## 2019-01-12 MED ORDER — LIDOCAINE-EPINEPHRINE 1 %-1:100000 IJ SOLN
INTRAMUSCULAR | Status: DC | PRN
  Administered 2019-01-12: 10 mL via INTRADERMAL

## 2019-01-12 MED ORDER — PHENOL 1.4 % MT LIQD: 1.0000 | OROMUCOSAL | Status: DC | PRN

## 2019-01-12 MED ORDER — CYCLOBENZAPRINE HCL 10 MG PO TABS
10.0000 mg | ORAL_TABLET | Freq: Three times a day (TID) | ORAL | Status: DC | PRN
  Administered 2019-01-13: 10 mg via ORAL
  Filled 2019-01-12: qty 1

## 2019-01-12 MED ORDER — ALUM & MAG HYDROXIDE-SIMETH 200-200-20 MG/5ML PO SUSP: 30.0000 mL | Freq: Four times a day (QID) | ORAL | Status: DC | PRN

## 2019-01-12 MED ORDER — HEMOSTATIC AGENTS (NO CHARGE) OPTIME
TOPICAL | Status: DC | PRN
  Administered 2019-01-12: 1 via TOPICAL

## 2019-01-12 MED ORDER — THROMBIN 5000 UNITS EX SOLR
CUTANEOUS | Status: DC | PRN
  Administered 2019-01-12 (×2): 5000 [IU] via TOPICAL

## 2019-01-12 MED ORDER — PANTOPRAZOLE SODIUM 40 MG PO TBEC
40.0000 mg | DELAYED_RELEASE_TABLET | Freq: Two times a day (BID) | ORAL | Status: DC
  Administered 2019-01-12 – 2019-01-13 (×2): 40 mg via ORAL
  Filled 2019-01-12 (×3): qty 1

## 2019-01-12 MED ORDER — HYDROMORPHONE HCL 1 MG/ML IJ SOLN
0.2500 mg | INTRAMUSCULAR | Status: DC | PRN
  Administered 2019-01-12 (×4): 0.5 mg via INTRAVENOUS

## 2019-01-12 MED ORDER — KETOROLAC TROMETHAMINE 30 MG/ML IJ SOLN
INTRAMUSCULAR | Status: AC
  Filled 2019-01-12: qty 1

## 2019-01-12 MED ORDER — MENTHOL 3 MG MT LOZG: 1.0000 | LOZENGE | OROMUCOSAL | Status: DC | PRN

## 2019-01-12 MED ORDER — CITALOPRAM HYDROBROMIDE 20 MG PO TABS
40.0000 mg | ORAL_TABLET | Freq: Every day | ORAL | Status: DC
  Administered 2019-01-13: 40 mg via ORAL
  Filled 2019-01-12 (×2): qty 2

## 2019-01-12 MED ORDER — CEFAZOLIN SODIUM-DEXTROSE 2-4 GM/100ML-% IV SOLN
2.0000 g | Freq: Three times a day (TID) | INTRAVENOUS | Status: AC
  Administered 2019-01-12 (×2): 2 g via INTRAVENOUS
  Filled 2019-01-12 (×2): qty 100

## 2019-01-12 MED ORDER — METHOCARBAMOL 500 MG PO TABS
1000.0000 mg | ORAL_TABLET | Freq: Four times a day (QID) | ORAL | Status: DC
  Administered 2019-01-12 (×3): 1000 mg via ORAL
  Filled 2019-01-12 (×3): qty 2

## 2019-01-12 MED ORDER — PROMETHAZINE HCL 25 MG/ML IJ SOLN: 6.2500 mg | INTRAMUSCULAR | Status: DC | PRN

## 2019-01-12 MED ORDER — SODIUM CHLORIDE 0.9 % IV SOLN
INTRAVENOUS | Status: DC | PRN
  Administered 2019-01-12: 12:00:00

## 2019-01-12 SURGICAL SUPPLY — 54 items
BAG DECANTER FOR FLEXI CONT (MISCELLANEOUS) ×2 IMPLANT
BENZOIN TINCTURE PRP APPL 2/3 (GAUZE/BANDAGES/DRESSINGS) ×2 IMPLANT
BLADE CLIPPER SURG (BLADE) IMPLANT
BLADE SURG 11 STRL SS (BLADE) ×2 IMPLANT
BUR CUTTER 7.0 ROUND (BURR) ×2 IMPLANT
BUR MATCHSTICK NEURO 3.0 LAGG (BURR) ×2 IMPLANT
CANISTER SUCT 3000ML PPV (MISCELLANEOUS) ×2 IMPLANT
CARTRIDGE OIL MAESTRO DRILL (MISCELLANEOUS) ×1 IMPLANT
COVER WAND RF STERILE (DRAPES) IMPLANT
DECANTER SPIKE VIAL GLASS SM (MISCELLANEOUS) ×2 IMPLANT
DERMABOND ADVANCED (GAUZE/BANDAGES/DRESSINGS) ×1
DERMABOND ADVANCED .7 DNX12 (GAUZE/BANDAGES/DRESSINGS) ×1 IMPLANT
DIFFUSER DRILL AIR PNEUMATIC (MISCELLANEOUS) ×2 IMPLANT
DRAPE HALF SHEET 40X57 (DRAPES) ×2 IMPLANT
DRAPE LAPAROTOMY 100X72X124 (DRAPES) ×2 IMPLANT
DRAPE MICROSCOPE LEICA (MISCELLANEOUS) ×2 IMPLANT
DRAPE SURG 17X23 STRL (DRAPES) ×2 IMPLANT
DRSG OPSITE POSTOP 4X6 (GAUZE/BANDAGES/DRESSINGS) ×2 IMPLANT
DURAPREP 26ML APPLICATOR (WOUND CARE) ×2 IMPLANT
ELECT REM PT RETURN 9FT ADLT (ELECTROSURGICAL) ×2
ELECTRODE REM PT RTRN 9FT ADLT (ELECTROSURGICAL) ×1 IMPLANT
GAUZE 4X4 16PLY RFD (DISPOSABLE) IMPLANT
GAUZE SPONGE 4X4 12PLY STRL (GAUZE/BANDAGES/DRESSINGS) ×2 IMPLANT
GLOVE BIO SURGEON STRL SZ7 (GLOVE) IMPLANT
GLOVE BIO SURGEON STRL SZ8 (GLOVE) ×2 IMPLANT
GLOVE BIOGEL PI IND STRL 7.0 (GLOVE) IMPLANT
GLOVE BIOGEL PI IND STRL 8.5 (GLOVE) ×1 IMPLANT
GLOVE BIOGEL PI INDICATOR 7.0 (GLOVE)
GLOVE BIOGEL PI INDICATOR 8.5 (GLOVE) ×1
GLOVE ECLIPSE 8.5 STRL (GLOVE) ×2 IMPLANT
GLOVE EXAM NITRILE XL STR (GLOVE) IMPLANT
GLOVE INDICATOR 8.5 STRL (GLOVE) ×2 IMPLANT
GOWN STRL REUS W/ TWL LRG LVL3 (GOWN DISPOSABLE) IMPLANT
GOWN STRL REUS W/ TWL XL LVL3 (GOWN DISPOSABLE) ×2 IMPLANT
GOWN STRL REUS W/TWL 2XL LVL3 (GOWN DISPOSABLE) ×2 IMPLANT
GOWN STRL REUS W/TWL LRG LVL3 (GOWN DISPOSABLE)
GOWN STRL REUS W/TWL XL LVL3 (GOWN DISPOSABLE) ×2
KIT BASIN OR (CUSTOM PROCEDURE TRAY) ×2 IMPLANT
KIT TURNOVER KIT B (KITS) ×2 IMPLANT
NEEDLE HYPO 22GX1.5 SAFETY (NEEDLE) ×2 IMPLANT
NEEDLE SPNL 22GX3.5 QUINCKE BK (NEEDLE) ×2 IMPLANT
NS IRRIG 1000ML POUR BTL (IV SOLUTION) ×2 IMPLANT
OIL CARTRIDGE MAESTRO DRILL (MISCELLANEOUS) ×2
PACK LAMINECTOMY NEURO (CUSTOM PROCEDURE TRAY) ×2 IMPLANT
RUBBERBAND STERILE (MISCELLANEOUS) ×4 IMPLANT
SPONGE SURGIFOAM ABS GEL SZ50 (HEMOSTASIS) ×2 IMPLANT
STRIP CLOSURE SKIN 1/2X4 (GAUZE/BANDAGES/DRESSINGS) ×2 IMPLANT
SUT VIC AB 0 CT1 18XCR BRD8 (SUTURE) ×1 IMPLANT
SUT VIC AB 0 CT1 8-18 (SUTURE) ×1
SUT VIC AB 2-0 CT1 18 (SUTURE) ×2 IMPLANT
SUT VICRYL 4-0 PS2 18IN ABS (SUTURE) ×2 IMPLANT
TOWEL GREEN STERILE (TOWEL DISPOSABLE) ×2 IMPLANT
TOWEL GREEN STERILE FF (TOWEL DISPOSABLE) ×2 IMPLANT
WATER STERILE IRR 1000ML POUR (IV SOLUTION) ×2 IMPLANT

## 2019-01-12 NOTE — Plan of Care (Signed)
  Problem: Education: Goal: Ability to verbalize activity precautions or restrictions will improve Outcome: Progressing Goal: Knowledge of the prescribed therapeutic regimen will improve Outcome: Progressing Goal: Understanding of discharge needs will improve Outcome: Progressing   Problem: Activity: Goal: Ability to avoid complications of mobility impairment will improve Outcome: Progressing Goal: Ability to tolerate increased activity will improve Outcome: Progressing Goal: Will remain free from falls Outcome: Progressing   Problem: Pain Management: Goal: Pain level will decrease Outcome: Progressing   Problem: Skin Integrity: Goal: Will show signs of wound healing Outcome: Progressing   Problem: Bladder/Genitourinary: Goal: Urinary functional status for postoperative course will improve Outcome: Progressing

## 2019-01-12 NOTE — Anesthesia Postprocedure Evaluation (Signed)
Anesthesia Post Note  Patient: [name]  Procedure(s) Performed: Microdiscectomy Left Lumbar Three-Four (Left Back)     Patient location during evaluation: PACU Anesthesia Type: General Level of consciousness: awake and alert Pain management: pain level controlled Vital Signs Assessment: post-procedure vital signs reviewed and stable Respiratory status: spontaneous breathing, nonlabored ventilation, respiratory function stable and patient connected to nasal cannula oxygen Cardiovascular status: blood pressure returned to baseline and stable Postop Assessment: no apparent nausea or vomiting Anesthetic complications: no    Last Vitals:  Vitals:   01/12/19 1408 01/12/19 1425  BP:  111/67  Pulse:  76  Resp:  18  Temp: 36.5 C 36.9 C  SpO2:  100%    Last Pain:  Vitals:   01/12/19 1628  TempSrc:   PainSc: 7                  Ryan P Ellender

## 2019-01-12 NOTE — Anesthesia Preprocedure Evaluation (Addendum)
Anesthesia Evaluation  Patient identified by MRN, date of birth, ID band Patient awake    Reviewed: Allergy & Precautions, NPO status , Patient's Chart, lab work & pertinent test results  Airway Mallampati: I  TM Distance: >3 FB Neck ROM: Full    Dental no notable dental hx.    Pulmonary asthma , sleep apnea and Continuous Positive Airway Pressure Ventilation , Current Smoker,    Pulmonary exam normal breath sounds clear to auscultation       Cardiovascular negative cardio ROS Normal cardiovascular exam Rhythm:Regular Rate:Normal  ECG: SR, rate 90   Neuro/Psych  Headaches, PSYCHIATRIC DISORDERS Anxiety Depression Schizophrenia PTSD (post-traumatic stress disorder)    GI/Hepatic Neg liver ROS, GERD  Medicated and Controlled,  Endo/Other  negative endocrine ROS  Renal/GU negative Renal ROS     Musculoskeletal Splint to right wrist   Abdominal   Peds  Hematology negative hematology ROS (+)   Anesthesia Other Findings HNP  Reproductive/Obstetrics hcg negative                          Anesthesia Physical Anesthesia Plan  ASA: III  Anesthesia Plan: General   Post-op Pain Management:    Induction: Intravenous  PONV Risk Score and Plan: 3 and Ondansetron, Dexamethasone, Midazolam and Treatment may vary due to age or medical condition  Airway Management Planned: Oral ETT  Additional Equipment:   Intra-op Plan:   Post-operative Plan: Extubation in OR  Informed Consent: I have reviewed the patients History and Physical, chart, labs and discussed the procedure including the risks, benefits and alternatives for the proposed anesthesia with the patient or authorized representative who has indicated his/her understanding and acceptance.   Dental advisory given  Plan Discussed with: CRNA  Anesthesia Plan Comments:         Anesthesia Quick Evaluation

## 2019-01-12 NOTE — Transfer of Care (Signed)
Immediate Anesthesia Transfer of Care Note  Patient: [name]  Procedure(s) Performed: Microdiscectomy Left Lumbar Three-Four (Left Back)  Patient Location: PACU  Anesthesia Type:General  Level of Consciousness: awake, alert , oriented and sedated  Airway & Oxygen Therapy: Patient Spontanous Breathing and Patient connected to face mask oxygen  Post-op Assessment: Report given to RN, Post -op Vital signs reviewed and stable and Patient moving all extremities  Post vital signs: Reviewed and stable  Last Vitals:  Vitals Value Taken Time  BP 132/64 01/12/2019  1:03 PM  Temp    Pulse 91 01/12/2019  1:06 PM  Resp 14 01/12/2019  1:06 PM  SpO2 97 % 01/12/2019  1:06 PM  Vitals shown include unvalidated device data.  Last Pain:  Vitals:   01/12/19 1000  TempSrc:   PainSc: 7       Patients Stated Pain Goal: 2 (01/12/19 1000)  Complications: No apparent anesthesia complications

## 2019-01-12 NOTE — H&P (Signed)
[name] is an 33 y.o. female.   Chief Complaint: back and left leg pain HPI: 33 year old female with back and left leg pain consistent with an L3 nerve root pattern. Workup revealed an intact or foraminal disc herniation at L3-4 on the left. Patient failed all forms of conservative treatment due to her a conservative treatment imaging findings and progression of clinical syndrome I recommended neck to foraminal discectomy at L3-4 on the left. I extensively went over the risks and benefits of the operation with her as well as perioperative course expectations of outcome and alternatives of surgery and she understood and agreed to proceed forward.  Past Medical History:  Diagnosis Date  . Anxiety   . Asthma   . Bartholin cyst LEFT SIDE  . Bicornuate uterus S/P EXCISION RIGHT UTERUS HORN 2007  . Broken thumb    Right  . Depression   . GERD (gastroesophageal reflux disease)   . Headache    sleep, xanax  . History of ovarian cyst   . PTSD (post-traumatic stress disorder)   . Schizophrenia (HCC)   . Sleep apnea     Past Surgical History:  Procedure Laterality Date  . APPENDECTOMY    . BARTHOLIN CYST MARSUPIALIZATION  07/18/2012   Procedure: BARTHOLIN CYST MARSUPIALIZATION;  Surgeon: Ok Edwards, MD;  Location: [facility];  Service: Gynecology;  Laterality: Left;  Left Bartholin Duct Marsupilization. One hour.   Marland Kitchen BARTHOLIN GLAND CYST EXCISION Left 02/11/2015   Procedure: BARTHOLIN GLAND EXCISION;  Surgeon: Mitchel Honour, DO;  Location: [facility];  Service: Gynecology;  Laterality: Left;  . CARDIAC CATHETERIZATION    . DIAGNOSTIC LAPAROSCOPY    . LACERATION REPAIR Left 05/29/2016   Procedure: REPAIR MULTIPLE LACERATIONS;  Surgeon: Mack Hook, MD;  Location: [facility];  Service: Orthopedics;  Laterality: Left;  laceration left forearm   . LAPAROSCOPIC APPENDECTOMY AND RIGHT OVARIAN CYSTECTOMY  09-04-2004  . LAPAROSCOPIC EXCISION HYPOPLASTIC RIGHT  UTERINE HORN AND RIGHT SALPINGOOPHECTOMY  03-18-2006  . TENDON REPAIR Left 05/29/2016   Procedure: TENDON REPAIR;  Surgeon: Mack Hook, MD;  Location: [facility];  Service: Orthopedics;  Laterality: Left;  . WISDOM TOOTH EXTRACTION      Family History  Problem Relation Age of Onset  . Diabetes Mother   . Hyperlipidemia Mother   . Bipolar disorder Mother   . Depression Mother   . Hypertension Maternal Grandmother   . Heart disease Maternal Grandmother 60  . Diabetes Maternal Grandfather   . Hypertension Maternal Grandfather   . Hyperlipidemia Maternal Grandfather   . Alcohol abuse Maternal Grandfather   . Breast cancer Paternal Grandmother   . Cancer Paternal Grandmother        breast  . Alcohol abuse Father   . Heart disease Father        not sure what it is  . Cancer - Colon Paternal Grandfather   . Drug abuse Maternal Aunt   . Schizophrenia Cousin    Social History:  reports that she has been smoking cigarettes. She has a 16.00 pack-year smoking history. She has never used smokeless tobacco. She reports current alcohol use of about 5.0 standard drinks of alcohol per week. She reports previous drug use. Drug: Marijuana.  Allergies:  Allergies  Allergen Reactions  . Adhesive [Tape] Rash    PAPER TAPE IS OKAY    Facility-Administered Medications Prior to Admission  Medication Dose Route Frequency Provider Last Rate Last Dose  . omalizumab Geoffry Paradise)  injection 150 mg  150 mg Subcutaneous Q28 days Jessica Priest, MD   150 mg at 12/08/18 1610   Medications Prior to Admission  Medication Sig Dispense Refill  . alprazolam (XANAX) 2 MG tablet Take 2 mg by mouth 2 (two) times daily.  2  . citalopram (CELEXA) 40 MG tablet Take 40 mg by mouth daily.   0  . dicyclomine (BENTYL) 20 MG tablet Take 20 mg by mouth 3 (three) times daily.   0  . EPINEPHrine 0.3 mg/0.3 mL IJ SOAJ injection Use as directed for life threatening allergic reactions (Patient taking differently:  Inject 0.3 mg into the muscle daily as needed (life threatening allergic reactions.). ) 2 Device 3  . famotidine (PEPCID) 40 MG tablet Take 40 mg by mouth 2 (two) times daily.  11  . gabapentin (NEURONTIN) 800 MG tablet Take 800 mg by mouth 2 (two) times daily as needed (pain.).     Marland Kitchen HYDROcodone-acetaminophen (NORCO/VICODIN) 5-325 MG tablet Take 1 tablet by mouth every 6 (six) hours as needed (pain).   0  . loratadine (CLARITIN) 10 MG tablet Take 10 mg by mouth daily.     . methocarbamol (ROBAXIN) 500 MG tablet Take 1,000 mg by mouth 4 (four) times daily.    . montelukast (SINGULAIR) 10 MG tablet Take 1 tablet (10 mg total) by mouth daily. 90 tablet 1  . nystatin (MYCOSTATIN) 100000 UNIT/ML suspension Swish and swallow 5 mls 3-4 times daily for 7 days (Patient taking differently: Take 5 mLs by mouth 4 (four) times daily as needed (mouth pain/thrush). \) 300 mL 0  . pantoprazole (PROTONIX) 40 MG tablet Take 1 tablet (40 mg total) by mouth every morning. (Patient taking differently: Take 40 mg by mouth 2 (two) times daily. ) 90 tablet 1  . sucralfate (CARAFATE) 1 g tablet Take 1 g by mouth 3 (three) times daily.     Marland Kitchen albuterol (PROAIR HFA) 108 (90 Base) MCG/ACT inhaler Inhale two puffs every four to six hours as needed for cough or wheeze. (Patient taking differently: Inhale 2 puffs into the lungs every 6 (six) hours as needed for wheezing or shortness of breath. ) 1 Inhaler 1  . budesonide-formoterol (SYMBICORT) 160-4.5 MCG/ACT inhaler Inhale two puffs twice daily with spacer to prevent cough or wheeze.  Rinse, gargle, and spit after use. (Patient taking differently: Inhale 2 puffs into the lungs 2 (two) times daily. Inhale two puffs twice daily with spacer to prevent cough or wheeze.  Rinse, gargle, and spit after use.) 1 Inhaler 5  . ipratropium (ATROVENT) 0.02 % nebulizer solution Can inhale the contents of one vial in nebulizer every four to six hours as needed for cough or wheeze.  Mix with  Levalbuterol. (Patient taking differently: Take 0.25 mg by nebulization every 4 (four) hours as needed for wheezing or shortness of breath. Mix with Levalbuterol.) 125 mL 1  . levalbuterol (XOPENEX) 1.25 MG/3ML nebulizer solution Use one vial in the nebulizer every 4-6 hours if needed for cough or wheeze.  Mix with Ipratropium as directed. (Patient taking differently: Take 1.25 mg by nebulization every 4 (four) hours as needed for wheezing or shortness of breath. ) 180 mL 1    Results for orders placed or performed during the hospital encounter of 01/12/19 (from the past 48 hour(s))  Pregnancy, urine POC     Status: None   Collection Time: 01/12/19  9:59 AM  Result Value Ref Range   Preg Test, Ur NEGATIVE NEGATIVE  Comment:        THE SENSITIVITY OF THIS METHODOLOGY IS >24 mIU/mL    No results found.  Review of Systems  Musculoskeletal: Positive for back pain.  Neurological: Positive for sensory change.    Blood pressure (!) 108/52, pulse 86, temperature 98.4 F (36.9 C), temperature source Oral, resp. rate 20, height 5\' 4"  (1.626 m), weight 70.3 kg, last menstrual period 12/22/2018, SpO2 100 %. Physical Exam  Constitutional: She is oriented to person, place, and time. She appears well-developed and well-nourished.  HENT:  Head: Normocephalic.  Eyes: Pupils are equal, round, and reactive to light.  Neck: Normal range of motion.  GI: Soft.  Neurological: She is alert and oriented to person, place, and time. She has normal strength. GCS eye subscore is 4. GCS verbal subscore is 5. GCS motor subscore is 6.  Strength is 5 out of 5 iliopsoas, quads, hamstrings, gastric, and tibialis, and EHL.     Assessment/Plan 33 year old female presents for a left L3 for extra foraminal discectomy.  Kylyn Mcdade P, MD 01/12/2019, 11:11 AM

## 2019-01-12 NOTE — Op Note (Signed)
Preoperative diagnosis: Herniated nucleus pulposus L3-4 extraforaminal with left L3 radiculopathy  Postoperative diagnosis: Same with spondylosis  Procedure: Extraforaminal approach to microscopic discectomy L3-4 on the left with microdissection of the left L3 nerve root microscopic discectomy  Surgeon: Jillyn Hidden Emon Miggins  Asst.: Barnett Abu  Anesthesia: Gen.  EBL: Minimal  History of present illness: 33 year old female is a progress worsening back left leg pain rating down anterolateral thigh consistent with an L3 nerve root pattern. Workup revealed extra foraminal disc herniation L3-4 on the left. Due to failure conservative treatment imaging findings and progressive clinical syndrome I recommended extra foraminal discectomy at L3-4 on the left we extensively gone over the risks and benefits of the operation with her as well as perioperative course expectations of outcome and alternatives of surgery and she understood and agreed to proceed forward.  Operative procedure: Patient brought into the or was induced on general anesthesia positioned prone the Wilson frame her back was prepped and draped in routine sterile fashion. Preoperative x-ray localize the L3 pedicle left so after infiltration of 10 mL lidocaine with epi midline incision was made and Bovie left car was used to 10 subcutaneous tissues and subperiosteal dissections care on the lamina of L to L3 and L4 exposing the facet complexes at L3-4 as well as to 3 and the pedicle 3. Sitting working inferior to the pedicle 3 confirmed by interoperative x-ray I drilled off the lateral pars and superior aspect of 34 facet. Identify the inner transverse ligament which was removed in piecemeal fashion identify the L3 nerve root. Looking underneath the L3 nerve root there was disc herniations still partially contained within the ligament and partially calcified I causing compression the undersurface of the L3 nerve root this was incised and cleanout pituitary  rongeurs Epstein curettes I had to bite a little bit the calcified annulus that was laterally and the displacement the space and pushed in the L3 nerve root up this decompressed the L3 foramen. At the end segment is no further stenosis L3 nerve root was well decompressed easily explored proximally and distally with a coronary dilator. Wounds encompassing irrigated meticulous he states was maintained Gelfoam was laid top of the dura the muscle fascia proximal in layers with after Vicryl skin was closed running 4 subcuticular Dermabond benzo and Steri-Strips and a sterile dressing was applied and patient went to recovery room in stable condition. At the end the case on it counts sponge counts were correct.

## 2019-01-12 NOTE — Anesthesia Procedure Notes (Signed)
Procedure Name: Intubation Date/Time: 01/12/2019 11:35 AM Performed by: Fransisca Kaufmann, CRNA Pre-anesthesia Checklist: Patient identified, Emergency Drugs available, Suction available and Patient being monitored Patient Re-evaluated:Patient Re-evaluated prior to induction Oxygen Delivery Method: Circle System Utilized Preoxygenation: Pre-oxygenation with 100% oxygen Induction Type: IV induction Ventilation: Mask ventilation without difficulty Laryngoscope Size: Miller and 2 Grade View: Grade I Tube type: Oral Tube size: 7.0 mm Number of attempts: 1 Airway Equipment and Method: Stylet and Oral airway Placement Confirmation: ETT inserted through vocal cords under direct vision,  positive ETCO2 and breath sounds checked- equal and bilateral Tube secured with: Tape Dental Injury: Teeth and Oropharynx as per pre-operative assessment

## 2019-01-13 ENCOUNTER — Encounter (HOSPITAL_COMMUNITY): Payer: Self-pay | Admitting: Neurosurgery

## 2019-01-13 DIAGNOSIS — J45909 Unspecified asthma, uncomplicated: Secondary | ICD-10-CM | POA: Diagnosis not present

## 2019-01-13 DIAGNOSIS — F419 Anxiety disorder, unspecified: Secondary | ICD-10-CM | POA: Diagnosis not present

## 2019-01-13 DIAGNOSIS — M4726 Other spondylosis with radiculopathy, lumbar region: Secondary | ICD-10-CM | POA: Diagnosis not present

## 2019-01-13 DIAGNOSIS — R51 Headache: Secondary | ICD-10-CM | POA: Diagnosis not present

## 2019-01-13 DIAGNOSIS — M5116 Intervertebral disc disorders with radiculopathy, lumbar region: Secondary | ICD-10-CM | POA: Diagnosis not present

## 2019-01-13 DIAGNOSIS — F329 Major depressive disorder, single episode, unspecified: Secondary | ICD-10-CM | POA: Diagnosis not present

## 2019-01-13 MED ORDER — DIAZEPAM 5 MG PO TABS
5.0000 mg | ORAL_TABLET | Freq: Four times a day (QID) | ORAL | Status: DC | PRN
  Administered 2019-01-13: 5 mg via ORAL
  Filled 2019-01-13: qty 1

## 2019-01-13 NOTE — Progress Notes (Signed)
Subjective: Patient reports a lot of back pain but leg pain is better  Objective: Vital signs in last 24 hours: Temp:  [97.7 F (36.5 C)-98.7 F (37.1 C)] 97.8 F (36.6 C) (01/14 0734) Pulse Rate:  [75-101] 79 (01/14 0734) Resp:  [12-20] 18 (01/14 0734) BP: (108-132)/(52-75) 120/75 (01/14 0734) SpO2:  [95 %-100 %] 100 % (01/14 0734) Weight:  [70.3 kg] 70.3 kg (01/13 0945)  Intake/Output from previous day: 01/13 0701 - 01/14 0700 In: 1088.1 [P.O.:240; I.V.:648.1; IV Piggyback:200] Out: 15 [Blood:15] Intake/Output this shift: No intake/output data recorded.  Neurologic: Grossly normal  Lab Results: Lab Results  Component Value Date   WBC 13.1 (H) 01/05/2019   HGB 14.1 01/05/2019   HCT 41.9 01/05/2019   MCV 106.3 (H) 01/05/2019   PLT 480 (H) 01/05/2019   No results found for: INR, PROTIME BMET Lab Results  Component Value Date   NA 133 (L) 01/05/2019   K 4.7 01/05/2019   CL 100 01/05/2019   CO2 22 01/05/2019   GLUCOSE 139 (H) 01/05/2019   BUN 10 01/05/2019   CREATININE 0.90 01/05/2019   CALCIUM 9.3 01/05/2019    Studies/Results: Dg Lumbar Spine 2-3 Views  Result Date: 01/12/2019 CLINICAL DATA:  L3-L4 micro discectomy. EXAM: LUMBAR SPINE - 2-3 VIEW COMPARISON:  MRI 07/26/2018 FINDINGS: Two images were submitted for surgical localization. Both images demonstrate surgical localization along the posterior aspect of L3 and between the L2 and L3 spinous processes. IMPRESSION: Surgical localization as described. Electronically Signed   By: Richarda Overlie M.D.   On: 01/12/2019 14:03    Assessment/Plan: Postop day 1 from microdiskectomy. Complains of a lot of back pain right now. Will give 5 of valium to see if this helps. She would like to do some PT this morning and talk with her significant other to determine d/c home or not today.    LOS: 0 days    Tiana Loft [facility] 01/13/2019, 8:35 AM

## 2019-01-13 NOTE — Discharge Summary (Signed)
Physician Discharge Summary  Patient ID: [name] MRN: [id number] DOB/AGE: Apr 06, 1986 33 y.o.  Admit date: 01/12/2019 Discharge date: 01/13/2019  Admission Diagnoses: Herniated nucleus pulposus L3-4 extraforaminal with left L3 radiculopathy  Discharge Diagnoses: same   Discharged Condition: good  Hospital Course: The patient was admitted on 01/12/2019 and taken to the operating room where the patient underwent extraforaminal microdiskectomy L3-4 left. The patient tolerated the procedure well and was taken to the recovery room and then to the floor in stable condition. The hospital course was routine. There were no complications. The wound remained clean dry and intact. Pt had appropriate back soreness. No complaints of leg pain or new N/T/W. The patient remained afebrile with stable vital signs, and tolerated a regular diet. The patient continued to increase activities, and pain was well controlled with oral pain medications.   Consults: None  Significant Diagnostic Studies:  Results for orders placed or performed during the hospital encounter of 01/12/19  Pregnancy, urine POC  Result Value Ref Range   Preg Test, Ur NEGATIVE NEGATIVE    Dg Lumbar Spine 2-3 Views  Result Date: 01/12/2019 CLINICAL DATA:  L3-L4 micro discectomy. EXAM: LUMBAR SPINE - 2-3 VIEW COMPARISON:  MRI 07/26/2018 FINDINGS: Two images were submitted for surgical localization. Both images demonstrate surgical localization along the posterior aspect of L3 and between the L2 and L3 spinous processes. IMPRESSION: Surgical localization as described. Electronically Signed   By: Richarda OverlieAdam  Henn M.D.   On: 01/12/2019 14:03    Antibiotics:  Anti-infectives (From admission, onward)   Start     Dose/Rate Route Frequency Ordered Stop   01/12/19 1430  ceFAZolin (ANCEF) IVPB 2g/100 mL premix     2 g 200 mL/hr over 30 Minutes Intravenous Every 8 hours 01/12/19 1419 01/12/19 2225   01/12/19 1204  bacitracin 50,000 Units in sodium  chloride 0.9 % 500 mL irrigation  Status:  Discontinued       As needed 01/12/19 1205 01/12/19 1254   01/12/19 0945  ceFAZolin (ANCEF) IVPB 2g/100 mL premix     2 g 200 mL/hr over 30 Minutes Intravenous On call to O.R. 01/12/19 0940 01/12/19 1111   01/12/19 0942  ceFAZolin (ANCEF) 2-4 GM/100ML-% IVPB    Note to Pharmacy:  Wilburn MylarBurdo, Emily   : cabinet override      01/12/19 0942 01/12/19 1111      Discharge Exam: Blood pressure 120/75, pulse 79, temperature 97.8 F (36.6 C), temperature source Oral, resp. rate 18, height 5\' 4"  (1.626 m), weight 70.3 kg, last menstrual period 12/22/2018, SpO2 100 %. Neurologic: Grossly normal Ambulating and voiding well  Discharge Medications:   Allergies as of 01/13/2019      Reactions   Adhesive [tape] Rash   PAPER TAPE IS OKAY      Medication List    STOP taking these medications   HYDROcodone-acetaminophen 5-325 MG tablet Commonly known as:  NORCO/VICODIN     TAKE these medications   albuterol 108 (90 Base) MCG/ACT inhaler Commonly known as:  PROAIR HFA Inhale two puffs every four to six hours as needed for cough or wheeze. What changed:    how much to take  how to take this  when to take this  reasons to take this  additional instructions   alprazolam 2 MG tablet Commonly known as:  XANAX Take 2 mg by mouth 2 (two) times daily.   budesonide-formoterol 160-4.5 MCG/ACT inhaler Commonly known as:  SYMBICORT Inhale two puffs twice daily with spacer to  prevent cough or wheeze.  Rinse, gargle, and spit after use. What changed:    how much to take  how to take this  when to take this   CARAFATE 1 g tablet Generic drug:  sucralfate Take 1 g by mouth 3 (three) times daily.   citalopram 40 MG tablet Commonly known as:  CELEXA Take 40 mg by mouth daily.   dicyclomine 20 MG tablet Commonly known as:  BENTYL Take 20 mg by mouth 3 (three) times daily.   EPINEPHrine 0.3 mg/0.3 mL Soaj injection Commonly known as:   EPI-PEN Use as directed for life threatening allergic reactions What changed:    how much to take  how to take this  when to take this  reasons to take this  additional instructions   famotidine 40 MG tablet Commonly known as:  PEPCID Take 40 mg by mouth 2 (two) times daily.   gabapentin 800 MG tablet Commonly known as:  NEURONTIN Take 800 mg by mouth 2 (two) times daily as needed (pain.).   ipratropium 0.02 % nebulizer solution Commonly known as:  ATROVENT Can inhale the contents of one vial in nebulizer every four to six hours as needed for cough or wheeze.  Mix with Levalbuterol. What changed:    how much to take  how to take this  when to take this  reasons to take this  additional instructions   levalbuterol 1.25 MG/3ML nebulizer solution Commonly known as:  XOPENEX Use one vial in the nebulizer every 4-6 hours if needed for cough or wheeze.  Mix with Ipratropium as directed. What changed:    how much to take  how to take this  when to take this  reasons to take this  additional instructions   loratadine 10 MG tablet Commonly known as:  CLARITIN Take 10 mg by mouth daily.   methocarbamol 500 MG tablet Commonly known as:  ROBAXIN Take 1,000 mg by mouth 4 (four) times daily.   montelukast 10 MG tablet Commonly known as:  SINGULAIR Take 1 tablet (10 mg total) by mouth daily.   nystatin 100000 UNIT/ML suspension Commonly known as:  MYCOSTATIN Swish and swallow 5 mls 3-4 times daily for 7 days What changed:    how much to take  how to take this  when to take this  reasons to take this  additional instructions   pantoprazole 40 MG tablet Commonly known as:  PROTONIX Take 1 tablet (40 mg total) by mouth every morning. What changed:  when to take this       Disposition: home   Final Dx: microdiskectomy L3-4 left  Discharge Instructions     Remove dressing in 72 hours   Complete by:  As directed    Call MD for:  difficulty  breathing, headache or visual disturbances   Complete by:  As directed    Call MD for:  hives   Complete by:  As directed    Call MD for:  persistant dizziness or light-headedness   Complete by:  As directed    Call MD for:  persistant nausea and vomiting   Complete by:  As directed    Call MD for:  redness, tenderness, or signs of infection (pain, swelling, redness, odor or green/yellow discharge around incision site)   Complete by:  As directed    Call MD for:  severe uncontrolled pain   Complete by:  As directed    Call MD for:  temperature >100.4   Complete  by:  As directed    Diet - low sodium heart healthy   Complete by:  As directed    Driving Restrictions   Complete by:  As directed    No driving for 2 weeks, no riding in the car for 1 week   Increase activity slowly   Complete by:  As directed    Lifting restrictions   Complete by:  As directed    No lifting more than 8 lbs         Signed: Tiana Loft Gleen Ripberger 01/13/2019, 2:45 PM

## 2019-01-13 NOTE — Progress Notes (Signed)
Pt given D/C instructions with verbal understanding. Rx's were sent to pharmacy by MD. Pt's incision is clean and dry with no sign of infection. Pt's IV was removed prior to D/C. Pt D/C'd home via wheelchair @ 1545 per MD order. Pt is stable @ D/C and has no other needs at this time. Rema Fendt, RN

## 2019-01-15 ENCOUNTER — Other Ambulatory Visit (HOSPITAL_COMMUNITY): Payer: Self-pay | Admitting: Neurosurgery

## 2019-01-15 ENCOUNTER — Ambulatory Visit (HOSPITAL_COMMUNITY)
Admission: RE | Admit: 2019-01-15 | Discharge: 2019-01-15 | Disposition: A | Discharge status: Home / Self Care | Payer: Medicare Other | Source: Ambulatory Visit | Attending: Vascular Surgery | Admitting: Vascular Surgery

## 2019-01-15 DIAGNOSIS — R600 Localized edema: Secondary | ICD-10-CM | POA: Insufficient documentation

## 2019-01-20 DIAGNOSIS — S01511A Laceration without foreign body of lip, initial encounter: Secondary | ICD-10-CM | POA: Diagnosis not present

## 2019-01-20 DIAGNOSIS — Z8659 Personal history of other mental and behavioral disorders: Secondary | ICD-10-CM | POA: Diagnosis not present

## 2019-01-20 DIAGNOSIS — F17210 Nicotine dependence, cigarettes, uncomplicated: Secondary | ICD-10-CM | POA: Diagnosis not present

## 2019-01-20 DIAGNOSIS — Z79899 Other long term (current) drug therapy: Secondary | ICD-10-CM | POA: Diagnosis not present

## 2019-01-20 DIAGNOSIS — F332 Major depressive disorder, recurrent severe without psychotic features: Secondary | ICD-10-CM | POA: Diagnosis not present

## 2019-01-20 DIAGNOSIS — J45909 Unspecified asthma, uncomplicated: Secondary | ICD-10-CM | POA: Diagnosis not present

## 2019-01-20 DIAGNOSIS — Z8673 Personal history of transient ischemic attack (TIA), and cerebral infarction without residual deficits: Secondary | ICD-10-CM | POA: Diagnosis not present

## 2019-01-20 DIAGNOSIS — Z79891 Long term (current) use of opiate analgesic: Secondary | ICD-10-CM | POA: Diagnosis not present

## 2019-01-27 DIAGNOSIS — M25341 Other instability, right hand: Secondary | ICD-10-CM | POA: Diagnosis not present

## 2019-01-27 DIAGNOSIS — S63681A Other sprain of right thumb, initial encounter: Secondary | ICD-10-CM | POA: Diagnosis not present

## 2019-01-27 DIAGNOSIS — S63682A Other sprain of left thumb, initial encounter: Secondary | ICD-10-CM | POA: Diagnosis not present

## 2019-01-28 DIAGNOSIS — X58XXXA Exposure to other specified factors, initial encounter: Secondary | ICD-10-CM | POA: Diagnosis not present

## 2019-01-28 DIAGNOSIS — Y999 Unspecified external cause status: Secondary | ICD-10-CM | POA: Diagnosis not present

## 2019-01-28 DIAGNOSIS — S5321XA Traumatic rupture of right radial collateral ligament, initial encounter: Secondary | ICD-10-CM | POA: Diagnosis not present

## 2019-02-02 DIAGNOSIS — S3992XA Unspecified injury of lower back, initial encounter: Secondary | ICD-10-CM | POA: Diagnosis not present

## 2019-02-02 DIAGNOSIS — M545 Low back pain: Secondary | ICD-10-CM | POA: Diagnosis not present

## 2019-02-02 DIAGNOSIS — M5126 Other intervertebral disc displacement, lumbar region: Secondary | ICD-10-CM | POA: Diagnosis not present

## 2019-02-04 ENCOUNTER — Ambulatory Visit (INDEPENDENT_AMBULATORY_CARE_PROVIDER_SITE_OTHER): Payer: Medicare Other | Admitting: Allergy and Immunology

## 2019-02-04 ENCOUNTER — Encounter: Payer: Self-pay | Admitting: Allergy and Immunology

## 2019-02-04 VITALS — BP 104/66 | HR 90 | Temp 98.8°F | Resp 20

## 2019-02-04 DIAGNOSIS — J3089 Other allergic rhinitis: Secondary | ICD-10-CM | POA: Diagnosis not present

## 2019-02-04 DIAGNOSIS — R0902 Hypoxemia: Secondary | ICD-10-CM | POA: Diagnosis not present

## 2019-02-04 DIAGNOSIS — J4541 Moderate persistent asthma with (acute) exacerbation: Secondary | ICD-10-CM | POA: Diagnosis not present

## 2019-02-04 DIAGNOSIS — G43909 Migraine, unspecified, not intractable, without status migrainosus: Secondary | ICD-10-CM | POA: Diagnosis not present

## 2019-02-04 DIAGNOSIS — F17210 Nicotine dependence, cigarettes, uncomplicated: Secondary | ICD-10-CM | POA: Diagnosis not present

## 2019-02-04 DIAGNOSIS — K219 Gastro-esophageal reflux disease without esophagitis: Secondary | ICD-10-CM | POA: Diagnosis not present

## 2019-02-04 MED ORDER — IPRATROPIUM-ALBUTEROL 0.5-2.5 (3) MG/3ML IN SOLN
3.0000 mL | Freq: Once | RESPIRATORY_TRACT | Status: AC
  Administered 2019-02-04: 3 mL via RESPIRATORY_TRACT

## 2019-02-04 MED ORDER — LEVOFLOXACIN 750 MG PO TABS: 750.0000 mg | ORAL_TABLET | Freq: Every day | ORAL | 0 refills | Status: DC

## 2019-02-04 NOTE — Patient Instructions (Addendum)
  1. Continue to perform Allergen avoidance measures - House dust mite  2. Continue to consider nicotine substitutes to replace tobacco smoke.    3. Continue toTreat and prevent inflammation:   A. Symbicort 160 2 inhalations 2 times per day with spacer  B. Flonase - one spray each nostril 2 times per day  C. montelukast 10 mg - one tablet one time per day  D. Prednisone 10mg  - 4 now, 2 tonight, then 2 daily for 7 days  4. Treat infection:   A. Continue bactrim as prescribed by hand surgeon  B. Levaquin 750 - 1 tablet one time a day for 10 days  5. Treat low oxygen:   A. 2 liter nasal cannula oxygen   6. Continue to Treat and prevent reflux:   A.  pantoprazole 40 mg 2 times per day  B.  Famotidine 40 mg 2 times per day  C.  Carafate slurry as directed  7. Continue to Treat and prevent headaches:   A. Continue to eliminate caffeine / chocolate  8. If needed:   A. nasal saline wash  B. Claritin 10 mg tablet 1 time per day  C. ProAir HFA 2 puffs   D. Xopenex + Ipratropium nebulizer every 4-6 hours.    E. mucinex DM - 2 tablets 2 times per day  9. Continue  Immunotherapy and Xolair   10.  DuoNeb administered in clinic today  11. Return to clinic tomorrow. ER if worse.

## 2019-02-04 NOTE — Progress Notes (Addendum)
Follow-up Note  Referring Provider: Charmayne Sheeravis, Ann H, MD Primary Provider: Charmayne Sheeravis, Ann H, MD Date of Office Visit: 02/04/2019  Subjective:   [name] (DOB: 06/06/1986) is a 33 y.o. female [facility] in re-evaluation of the following:  HPI: [name] presents to this clinic in evaluation of her asthma and allergic rhinitis and chronic headaches and LPR and tobacco smoke exposure addressed during her last evaluation of 01 December 2018 at which point in time she was doing relatively well on a very large collection of medications directed against inflammation and reflux.  She has run into some trouble over the course of the past week.  She had right hand surgery on Wednesday and on Thursday she had acute onset of feeling terrible in her chest with coughing and wheezing and shortness of breath and nasal congestion and ugly bloody nasal discharge and a high fever of 102 last night.  Her throat hurts a little bit and she fortunately has not been having any chest pain or ugly sputum production.  This acute issue is complicated by the fact that she apparently has had opening of her surgical wound affecting her left arm and it is now left open for secondary intention healing and she is presently using Bactrim.  She has an appointment to see her surgeon tomorrow.  Allergies as of 02/04/2019      Reactions   Adhesive [tape] Rash   PAPER TAPE IS OKAY      Medication List      albuterol 108 (90 Base) MCG/ACT inhaler Commonly known as:  PROAIR HFA Inhale two puffs every four to six hours as needed for cough or wheeze.   alprazolam 2 MG tablet Commonly known as:  XANAX Take 2 mg by mouth 2 (two) times daily.   budesonide-formoterol 160-4.5 MCG/ACT inhaler Commonly known as:  SYMBICORT Inhale two puffs twice daily with spacer to prevent cough or wheeze.  Rinse, gargle, and spit after use.   CARAFATE 1 g tablet Generic drug:  sucralfate Take 1 g  by mouth 3 (three) times daily.   citalopram 40 MG tablet Commonly known as:  CELEXA Take 40 mg by mouth daily.   dicyclomine 20 MG tablet Commonly known as:  BENTYL Take 20 mg by mouth 3 (three) times daily.   EPINEPHrine 0.3 mg/0.3 mL Soaj injection Commonly known as:  EPI-PEN Use as directed for life threatening allergic reactions   famotidine 20 MG tablet Commonly known as:  PEPCID Take 40 mg by mouth 2 (two) times daily.   gabapentin 800 MG tablet Commonly known as:  NEURONTIN Take 800 mg by mouth 2 (two) times daily as needed (pain.).   ipratropium 0.02 % nebulizer solution Commonly known as:  ATROVENT Can inhale the contents of one vial in nebulizer every four to six hours as needed for cough or wheeze.  Mix with Levalbuterol.   levalbuterol 1.25 MG/3ML nebulizer solution Commonly known as:  XOPENEX Use one vial in the nebulizer every 4-6 hours if needed for cough or wheeze.  Mix with Ipratropium as directed.   loratadine 10 MG tablet Commonly known as:  CLARITIN Take 10 mg by mouth daily.   methocarbamol 500 MG tablet Commonly known as:  ROBAXIN Take 1,000 mg by mouth 4 (four) times daily.   montelukast 10 MG tablet Commonly known as:  SINGULAIR Take 1 tablet (10 mg total) by mouth daily.   nystatin 100000 UNIT/ML suspension  Commonly known as:  MYCOSTATIN Swish and swallow 5 mls 3-4 times daily for 7 days   sulfamethoxazole-trimethoprim 800-160 MG tablet Commonly known as:  BACTRIM DS,SEPTRA DS Take by mouth.   tiZANidine 4 MG tablet Commonly known as:  ZANAFLEX TAKE 1 TABLET BY MOUTH EVERY 6 TO 8 HOURS AS NEEDED. NOT TO EXCEED 3 DOSES IN 24 HOURS   XOLAIR 150 MG injection Generic drug:  omalizumab       Past Medical History:  Diagnosis Date  . Anxiety   . Asthma   . Bartholin cyst LEFT SIDE  . Bicornuate uterus S/P EXCISION RIGHT UTERUS HORN 2007  . Broken thumb    Right  . Depression   . GERD (gastroesophageal reflux disease)   .  Headache    sleep, xanax  . History of ovarian cyst   . PTSD (post-traumatic stress disorder)   . Schizophrenia (HCC)   . Sleep apnea     Past Surgical History:  Procedure Laterality Date  . APPENDECTOMY    . BARTHOLIN CYST MARSUPIALIZATION  07/18/2012   Procedure: BARTHOLIN CYST MARSUPIALIZATION;  Surgeon: Ok Edwards, MD;  Location[facility];  Service: Gynecology;  Laterality: Left;  Left Bartholin Duct Marsupilization. One hour.   Marland Kitchen BARTHOLIN GLAND CYST EXCISION Left 02/11/2015   Procedure: BARTHOLIN GLAND EXCISION;  Surgeon: Mitchel Honour, DO;  Location: [facility];  Service: Gynecology;  Laterality: Left;  . CARDIAC CATHETERIZATION    . DIAGNOSTIC LAPAROSCOPY    . LACERATION REPAIR Left 05/29/2016   Procedure: REPAIR MULTIPLE LACERATIONS;  Surgeon: Mack Hook, MD;  Location: [facility];  Service: Orthopedics;  Laterality: Left;  laceration left forearm   . LAPAROSCOPIC APPENDECTOMY AND RIGHT OVARIAN CYSTECTOMY  09-04-2004  . LAPAROSCOPIC EXCISION HYPOPLASTIC RIGHT UTERINE HORN AND RIGHT SALPINGOOPHECTOMY  03-18-2006  . LIGAMENT REPAIR Right 12/2018   Thumb, radial collateral ligament repair/reconstrution  . LUMBAR LAMINECTOMY/DECOMPRESSION MICRODISCECTOMY Left 01/12/2019   Procedure: Microdiscectomy Left Lumbar Three-Four;  Surgeon: Donalee Citrin, MD;  [facility];  Service: Neurosurgery;  Laterality: Left;  Microdiscectomy Left Lumbar Three-Four  . TENDON REPAIR Left 05/29/2016   Procedure: TENDON REPAIR;  Surgeon: Mack Hook, MD;  Location: [facility];  Service: Orthopedics;  Laterality: Left;  . WISDOM TOOTH EXTRACTION      Review of systems negative except as noted in HPI / PMHx or noted below:  Review of Systems  Constitutional: Negative.   HENT: Negative.   Eyes: Negative.   Respiratory: Negative.   Cardiovascular: Negative.   Gastrointestinal: Negative.   Genitourinary: Negative.   Musculoskeletal: Negative.     Skin: Negative.   Neurological: Negative.   Endo/Heme/Allergies: Negative.   Psychiatric/Behavioral: Negative.      Objective:   Vitals:   02/04/19 1147 02/04/19 1219  BP: 104/66   Pulse: 90   Resp: 20   Temp: 98.8 F (37.1 C)   SpO2: (!) 88% 92%          Physical Exam Constitutional:      Appearance: She is not diaphoretic.     Comments: Coughing  HENT:     Head: Normocephalic.     Right Ear: Tympanic membrane, ear canal and external ear normal.     Left Ear: Tympanic membrane, ear canal and external ear normal.     Nose: Mucosal edema (Erythematous) present. No rhinorrhea.     Mouth/Throat:     Pharynx: Uvula midline. Posterior oropharyngeal erythema present. No oropharyngeal exudate.  Eyes:  Conjunctiva/sclera: Conjunctivae normal.  Neck:     Thyroid: No thyromegaly.     Trachea: Trachea normal. No tracheal tenderness or tracheal deviation.  Cardiovascular:     Rate and Rhythm: Normal rate and regular rhythm.     Heart sounds: Normal heart sounds, S1 normal and S2 normal. No murmur.  Pulmonary:     Effort: No respiratory distress.     Breath sounds: No stridor. Wheezing (Widespread bilateral inspiratory and expiratory wheezes throughout all lung fields.  No crackles) present. No rales.  Lymphadenopathy:     Head:     Right side of head: No tonsillar adenopathy.     Left side of head: No tonsillar adenopathy.     Cervical: No cervical adenopathy.  Skin:    Findings: No erythema or rash.     Nails: There is no clubbing.   Neurological:     Mental Status: She is alert.     Diagnostics:    Oxygen saturation was 88% on room air at rest.  With 2 L nasal cannula her oxygen saturation rose to 92% at rest.  Assessment and Plan:   1. Asthma, not well controlled, moderate persistent, with acute exacerbation   2. Other allergic rhinitis   3. LPRD (laryngopharyngeal reflux disease)   4. Migraine syndrome   5. Heavy tobacco smoker >10 cigarettes per day    6. Hypoxemia       1. Continue to perform Allergen avoidance measures - House dust mite  2. Continue to consider nicotine substitutes to replace tobacco smoke.    3. Continue toTreat and prevent inflammation:   A. Symbicort 160 2 inhalations 2 times per day with spacer  B. Flonase - one spray each nostril 2 times per day  C. montelukast 10 mg - one tablet one time per day  D. Prednisone 10mg  - 4 now, 2 tonight, then 2 daily for 7 days  4. Treat infection:   A. Continue bactrim as prescribed by hand surgeon  B. Levaquin 750 - 1 tablet one time a day for 10 days  5. Treat low oxygen:   A. 2 liter nasal cannula oxygen   6. Continue to Treat and prevent reflux:   A.  pantoprazole 40 mg 2 times per day  B.  Famotidine 40 mg 2 times per day  C.  Carafate slurry as directed  7. Continue to Treat and prevent headaches:   A. Continue to eliminate caffeine / chocolate  8. If needed:   A. nasal saline wash  B. Claritin 10 mg tablet 1 time per day  C. ProAir HFA 2 puffs   D. Xopenex + Ipratropium nebulizer every 4-6 hours.    E. mucinex DM - 2 tablets 2 times per day  9. Continue  Immunotherapy and Xolair   10.  DuoNeb administered in clinic today  11. Return to clinic tomorrow. ER if worse.  [name] has very significant inflammation of her airway and I suspect that there is a infection driving this issue and we will treat her with a broad-spectrum antibiotic as noted above as well as anti-inflammatory agents for airway and provide her supplemental oxygen.  Her partner and Baird LyonsCasey appear to understand her situation quite well and they appear to understand how her medications work and the appropriate use of her medications.  Assuming she does well I will see her back in this clinic tomorrow and I have informed her that should she have significant problems as the night moves forward she should  go to the emergency room.  Laurette Schimke, MD Allergy / Immunology [facility]  05 February 2019 Addendum: Baird Lyons presents to clinic today.  She was able to sleep last night as her cough had abated somewhat.  She still had a fever yesterday but she can breathe better.  Oxygen saturation today was 93% on 2 L nasal cannula.  She will remain on oxygen and all other therapy administered yesterday and I will see her back in this clinic in 3 days or earlier if there is a problem.  Laurette Schimke, MD Allergy / Immunology [facility]

## 2019-02-05 ENCOUNTER — Ambulatory Visit: Payer: Medicare Other | Admitting: Allergy and Immunology

## 2019-02-05 ENCOUNTER — Encounter: Payer: Self-pay | Admitting: Allergy and Immunology

## 2019-02-05 ENCOUNTER — Telehealth: Payer: Self-pay | Admitting: *Deleted

## 2019-02-05 NOTE — Telephone Encounter (Signed)
-----   Message from Jessica Priest, MD sent at 02/05/2019 10:48 AM EST ----- Please contact patient and ask her how she is doing since her visit yesterday.  Will she be coming to see [place] to have her oxygen saturation checked today?

## 2019-02-05 NOTE — Telephone Encounter (Signed)
Spoke with patient's fiance and she will be coming by today for a follow up at 2.

## 2019-02-06 ENCOUNTER — Telehealth: Payer: Self-pay | Admitting: *Deleted

## 2019-02-06 NOTE — Telephone Encounter (Signed)
[name]'s fiance called to say that her cough is getting much worse. She is using all of the medications that Dr. Lucie Leather prescribed as well as using the oxygen. She has tried [employer] and [employer], neither have provided any relief.  She wants to know if there is anything else that can be called in to help with her cough. Please advise.

## 2019-02-06 NOTE — Telephone Encounter (Signed)
Left message for [name] to call office.

## 2019-02-06 NOTE — Telephone Encounter (Signed)
Reviewed note.  It appears she is already on significant amount of medication including Levaquin and prednisone n addition to her routine asthma medications.   Per Dr. Kathyrn Lass rec if symptoms are worsening she should go to the ED and I agree with that.

## 2019-02-06 NOTE — Telephone Encounter (Signed)
Patient informed. 

## 2019-02-09 ENCOUNTER — Encounter: Payer: Self-pay | Admitting: Allergy and Immunology

## 2019-02-09 DIAGNOSIS — M79641 Pain in right hand: Secondary | ICD-10-CM | POA: Diagnosis not present

## 2019-02-09 DIAGNOSIS — S63602D Unspecified sprain of left thumb, subsequent encounter: Secondary | ICD-10-CM | POA: Diagnosis not present

## 2019-02-09 DIAGNOSIS — S61401S Unspecified open wound of right hand, sequela: Secondary | ICD-10-CM | POA: Diagnosis not present

## 2019-02-17 DIAGNOSIS — M79644 Pain in right finger(s): Secondary | ICD-10-CM | POA: Diagnosis not present

## 2019-02-17 DIAGNOSIS — M79645 Pain in left finger(s): Secondary | ICD-10-CM | POA: Diagnosis not present

## 2019-02-17 DIAGNOSIS — S63602A Unspecified sprain of left thumb, initial encounter: Secondary | ICD-10-CM | POA: Diagnosis not present

## 2019-02-17 DIAGNOSIS — T8131XA Disruption of external operation (surgical) wound, not elsewhere classified, initial encounter: Secondary | ICD-10-CM | POA: Diagnosis not present

## 2019-02-19 DIAGNOSIS — L089 Local infection of the skin and subcutaneous tissue, unspecified: Secondary | ICD-10-CM | POA: Diagnosis not present

## 2019-02-20 DIAGNOSIS — L089 Local infection of the skin and subcutaneous tissue, unspecified: Secondary | ICD-10-CM | POA: Diagnosis not present

## 2019-02-24 DIAGNOSIS — J45909 Unspecified asthma, uncomplicated: Secondary | ICD-10-CM | POA: Diagnosis not present

## 2019-02-24 DIAGNOSIS — G4730 Sleep apnea, unspecified: Secondary | ICD-10-CM | POA: Diagnosis not present

## 2019-02-24 DIAGNOSIS — T8131XA Disruption of external operation (surgical) wound, not elsewhere classified, initial encounter: Secondary | ICD-10-CM | POA: Diagnosis not present

## 2019-02-24 DIAGNOSIS — F17200 Nicotine dependence, unspecified, uncomplicated: Secondary | ICD-10-CM | POA: Diagnosis not present

## 2019-03-02 DIAGNOSIS — L089 Local infection of the skin and subcutaneous tissue, unspecified: Secondary | ICD-10-CM | POA: Diagnosis not present

## 2019-03-04 DIAGNOSIS — F419 Anxiety disorder, unspecified: Secondary | ICD-10-CM | POA: Diagnosis not present

## 2019-03-04 DIAGNOSIS — F17200 Nicotine dependence, unspecified, uncomplicated: Secondary | ICD-10-CM | POA: Diagnosis not present

## 2019-03-04 DIAGNOSIS — M869 Osteomyelitis, unspecified: Secondary | ICD-10-CM | POA: Diagnosis not present

## 2019-03-04 DIAGNOSIS — Z716 Tobacco abuse counseling: Secondary | ICD-10-CM | POA: Diagnosis not present

## 2019-03-04 DIAGNOSIS — F209 Schizophrenia, unspecified: Secondary | ICD-10-CM | POA: Diagnosis not present

## 2019-03-04 DIAGNOSIS — S61001A Unspecified open wound of right thumb without damage to nail, initial encounter: Secondary | ICD-10-CM | POA: Diagnosis not present

## 2019-03-04 DIAGNOSIS — R509 Fever, unspecified: Secondary | ICD-10-CM | POA: Diagnosis not present

## 2019-03-04 DIAGNOSIS — T8131XA Disruption of external operation (surgical) wound, not elsewhere classified, initial encounter: Secondary | ICD-10-CM | POA: Diagnosis not present

## 2019-03-04 DIAGNOSIS — F17210 Nicotine dependence, cigarettes, uncomplicated: Secondary | ICD-10-CM | POA: Diagnosis not present

## 2019-03-04 DIAGNOSIS — Z79899 Other long term (current) drug therapy: Secondary | ICD-10-CM | POA: Diagnosis not present

## 2019-03-04 DIAGNOSIS — L089 Local infection of the skin and subcutaneous tissue, unspecified: Secondary | ICD-10-CM | POA: Diagnosis not present

## 2019-03-04 DIAGNOSIS — Z01810 Encounter for preprocedural cardiovascular examination: Secondary | ICD-10-CM | POA: Diagnosis not present

## 2019-03-04 DIAGNOSIS — Z452 Encounter for adjustment and management of vascular access device: Secondary | ICD-10-CM | POA: Diagnosis not present

## 2019-03-04 DIAGNOSIS — G4733 Obstructive sleep apnea (adult) (pediatric): Secondary | ICD-10-CM | POA: Diagnosis not present

## 2019-03-04 DIAGNOSIS — M94241 Chondromalacia, joints of right hand: Secondary | ICD-10-CM | POA: Diagnosis not present

## 2019-03-04 DIAGNOSIS — Z9119 Patient's noncompliance with other medical treatment and regimen: Secondary | ICD-10-CM | POA: Diagnosis not present

## 2019-03-04 DIAGNOSIS — B9561 Methicillin susceptible Staphylococcus aureus infection as the cause of diseases classified elsewhere: Secondary | ICD-10-CM | POA: Diagnosis present

## 2019-03-04 DIAGNOSIS — J45909 Unspecified asthma, uncomplicated: Secondary | ICD-10-CM | POA: Diagnosis not present

## 2019-03-09 DIAGNOSIS — T8131XA Disruption of external operation (surgical) wound, not elsewhere classified, initial encounter: Secondary | ICD-10-CM | POA: Diagnosis not present

## 2019-03-09 DIAGNOSIS — L089 Local infection of the skin and subcutaneous tissue, unspecified: Secondary | ICD-10-CM | POA: Diagnosis not present

## 2019-03-10 DIAGNOSIS — Z792 Long term (current) use of antibiotics: Secondary | ICD-10-CM | POA: Diagnosis not present

## 2019-03-10 DIAGNOSIS — S63602A Unspecified sprain of left thumb, initial encounter: Secondary | ICD-10-CM | POA: Diagnosis not present

## 2019-03-10 DIAGNOSIS — M009 Pyogenic arthritis, unspecified: Secondary | ICD-10-CM | POA: Diagnosis not present

## 2019-03-10 DIAGNOSIS — M19041 Primary osteoarthritis, right hand: Secondary | ICD-10-CM | POA: Diagnosis not present

## 2019-03-10 DIAGNOSIS — L089 Local infection of the skin and subcutaneous tissue, unspecified: Secondary | ICD-10-CM | POA: Diagnosis not present

## 2019-03-10 DIAGNOSIS — Z79899 Other long term (current) drug therapy: Secondary | ICD-10-CM | POA: Diagnosis not present

## 2019-03-10 DIAGNOSIS — A4901 Methicillin susceptible Staphylococcus aureus infection, unspecified site: Secondary | ICD-10-CM | POA: Diagnosis not present

## 2019-03-10 DIAGNOSIS — I808 Phlebitis and thrombophlebitis of other sites: Secondary | ICD-10-CM | POA: Diagnosis not present

## 2019-03-10 DIAGNOSIS — M79642 Pain in left hand: Secondary | ICD-10-CM | POA: Diagnosis not present

## 2019-03-11 DIAGNOSIS — L089 Local infection of the skin and subcutaneous tissue, unspecified: Secondary | ICD-10-CM | POA: Diagnosis not present

## 2019-03-12 DIAGNOSIS — Z452 Encounter for adjustment and management of vascular access device: Secondary | ICD-10-CM | POA: Diagnosis not present

## 2019-03-12 DIAGNOSIS — L0889 Other specified local infections of the skin and subcutaneous tissue: Secondary | ICD-10-CM | POA: Diagnosis not present

## 2019-03-12 DIAGNOSIS — M869 Osteomyelitis, unspecified: Secondary | ICD-10-CM | POA: Diagnosis not present

## 2019-03-16 DIAGNOSIS — L089 Local infection of the skin and subcutaneous tissue, unspecified: Secondary | ICD-10-CM | POA: Diagnosis not present

## 2019-03-16 DIAGNOSIS — T8131XA Disruption of external operation (surgical) wound, not elsewhere classified, initial encounter: Secondary | ICD-10-CM | POA: Diagnosis not present

## 2019-03-17 DIAGNOSIS — L089 Local infection of the skin and subcutaneous tissue, unspecified: Secondary | ICD-10-CM | POA: Diagnosis not present

## 2019-03-18 DIAGNOSIS — R509 Fever, unspecified: Secondary | ICD-10-CM | POA: Diagnosis not present

## 2019-03-18 DIAGNOSIS — J989 Respiratory disorder, unspecified: Secondary | ICD-10-CM | POA: Diagnosis not present

## 2019-03-19 DIAGNOSIS — Z03818 Encounter for observation for suspected exposure to other biological agents ruled out: Secondary | ICD-10-CM | POA: Diagnosis not present

## 2019-03-19 DIAGNOSIS — R05 Cough: Secondary | ICD-10-CM | POA: Diagnosis not present

## 2019-04-06 DIAGNOSIS — L089 Local infection of the skin and subcutaneous tissue, unspecified: Secondary | ICD-10-CM | POA: Diagnosis not present

## 2019-04-06 DIAGNOSIS — T8131XA Disruption of external operation (surgical) wound, not elsewhere classified, initial encounter: Secondary | ICD-10-CM | POA: Diagnosis not present

## 2019-04-09 DIAGNOSIS — L089 Local infection of the skin and subcutaneous tissue, unspecified: Secondary | ICD-10-CM | POA: Diagnosis not present

## 2019-04-09 DIAGNOSIS — T8131XA Disruption of external operation (surgical) wound, not elsewhere classified, initial encounter: Secondary | ICD-10-CM | POA: Diagnosis not present

## 2019-04-14 DIAGNOSIS — L089 Local infection of the skin and subcutaneous tissue, unspecified: Secondary | ICD-10-CM | POA: Diagnosis not present

## 2019-04-14 DIAGNOSIS — T8131XA Disruption of external operation (surgical) wound, not elsewhere classified, initial encounter: Secondary | ICD-10-CM | POA: Diagnosis not present

## 2019-04-14 DIAGNOSIS — M868X8 Other osteomyelitis, other site: Secondary | ICD-10-CM | POA: Diagnosis not present

## 2019-04-21 DIAGNOSIS — Z452 Encounter for adjustment and management of vascular access device: Secondary | ICD-10-CM | POA: Diagnosis not present

## 2019-04-21 DIAGNOSIS — Z95828 Presence of other vascular implants and grafts: Secondary | ICD-10-CM | POA: Diagnosis not present

## 2019-04-29 ENCOUNTER — Other Ambulatory Visit: Payer: Self-pay | Admitting: Allergy and Immunology

## 2019-05-04 DIAGNOSIS — T1490XA Injury, unspecified, initial encounter: Secondary | ICD-10-CM | POA: Diagnosis not present

## 2019-05-04 DIAGNOSIS — F4481 Dissociative identity disorder: Secondary | ICD-10-CM | POA: Diagnosis not present

## 2019-06-08 ENCOUNTER — Ambulatory Visit: Payer: Medicare Other | Admitting: Allergy and Immunology

## 2019-06-10 ENCOUNTER — Telehealth: Payer: Self-pay | Admitting: Allergy and Immunology

## 2019-06-10 ENCOUNTER — Other Ambulatory Visit: Payer: Self-pay

## 2019-06-10 ENCOUNTER — Encounter: Payer: Self-pay | Admitting: Allergy and Immunology

## 2019-06-10 ENCOUNTER — Ambulatory Visit (INDEPENDENT_AMBULATORY_CARE_PROVIDER_SITE_OTHER): Payer: Medicare Other | Admitting: Allergy and Immunology

## 2019-06-10 VITALS — BP 108/68 | HR 74 | Resp 16 | Ht 64.25 in | Wt 137.6 lb

## 2019-06-10 DIAGNOSIS — G43909 Migraine, unspecified, not intractable, without status migrainosus: Secondary | ICD-10-CM | POA: Diagnosis not present

## 2019-06-10 DIAGNOSIS — G4733 Obstructive sleep apnea (adult) (pediatric): Secondary | ICD-10-CM

## 2019-06-10 DIAGNOSIS — J4540 Moderate persistent asthma, uncomplicated: Secondary | ICD-10-CM | POA: Diagnosis not present

## 2019-06-10 DIAGNOSIS — J3089 Other allergic rhinitis: Secondary | ICD-10-CM

## 2019-06-10 DIAGNOSIS — M2550 Pain in unspecified joint: Secondary | ICD-10-CM

## 2019-06-10 DIAGNOSIS — F17210 Nicotine dependence, cigarettes, uncomplicated: Secondary | ICD-10-CM

## 2019-06-10 DIAGNOSIS — E28310 Symptomatic premature menopause: Secondary | ICD-10-CM | POA: Diagnosis not present

## 2019-06-10 DIAGNOSIS — K219 Gastro-esophageal reflux disease without esophagitis: Secondary | ICD-10-CM

## 2019-06-10 MED ORDER — PANTOPRAZOLE SODIUM 40 MG PO TBEC: 40.0000 mg | DELAYED_RELEASE_TABLET | Freq: Two times a day (BID) | ORAL | 5 refills | Status: DC

## 2019-06-10 MED ORDER — MOMETASONE FUROATE 50 MCG/ACT NA SUSP
NASAL | 5 refills | Status: DC
Start: 2019-06-10 — End: 2020-09-19

## 2019-06-10 MED ORDER — FAMOTIDINE 40 MG PO TABS: 40.0000 mg | ORAL_TABLET | Freq: Two times a day (BID) | ORAL | 5 refills | Status: DC

## 2019-06-10 NOTE — Telephone Encounter (Signed)
Reather would like to restart Xolair.

## 2019-06-10 NOTE — Progress Notes (Signed)
[place] - [place] - [place] - [place] - [place]   Follow-up Note  Referring Provider: Charmayne Sheer, MD Primary Provider: Charmayne Sheer, MD Date of Office Visit: 06/10/2019  Subjective:   [name] (DOB: [birth year]) is a 33 y.o. female who [facility]2019 in re-evaluation of the following:  HPI: [name] returns to this clinic in reevaluation of her asthma and allergic rhinitis and chronic headaches and LPR and tobacco smoke exposure.  Her last visit to this clinic was 04 February 2019 at which point in time she had a very significant episode of lung inflammation believed secondary to an infectious disease that was associated with hypoxemia and she was started on oxygen.  Fortunately, she overcame that infection and no longer needs treatment on a regular basis.  She is now using oxygen only at nighttime as she will not use her CPAP machine prescribed by her pulmonologist for her untreated sleep apnea.  And she feels much better when using oxygen at nighttime regarding her quality of sleep and how she feels during the daytime.  At this point she has very little wheezing and coughing and does not use a short acting bronchodilator.  She has tapered off her Symbicort but remains on montelukast.  Her nose is intermittently congested but she will not use a nasal steroid.  She has tried Flonase in the past but this gives rise to extreme dryness and bleeding of her nose.  Because of the coronavirus pandemic and self isolation she has stopped using her Xolair and immunotherapy.  At this point she thinks that her reflux is going quite well on pantoprazole and famotidine.  She did have an upper endoscopy performed since she was seen in this clinic which apparently did identify significant fungal overgrowth of her esophagus.  The open surgical wound involving her thumb resulted in osteomyelitis/septic arthritis and she was apparently treated with prolonged  antibiotic therapy via home intravenous administration.  Fortunately, that issue appears to have completely resolved.  Her headaches are getting a lot worse.  She has daily headaches at this point time especially involving her left periorbital region.  She is scheduled to see neurology on Monday.  Apparently she has not had an MRI of her brain.  Interestingly, she has also noticed that she has had problems with intermittent numbness of her left arm and also intermittent weakness of her left arm.  Some of these episodes are associated with her migraine and some are unassociated with her migraine.  She has also been having diffuse aches in her joints and maybe her muscles over the course of the past few months.  She feels like a truck has hit her as she is just diffusely achy.  She continues to smoke currently at a rate of 1/2 pack/day.  Allergies as of 06/10/2019      Reactions   Adhesive [tape] Rash   PAPER TAPE IS OKAY      Medication List      albuterol 108 (90 Base) MCG/ACT inhaler Commonly known as:  ProAir HFA Inhale two puffs every four to six hours as needed for cough or wheeze.   alprazolam 2 MG tablet Commonly known as:  XANAX Take 2 mg by mouth 2 (two) times daily.   Carafate 1 g tablet Generic drug:  sucralfate Take 1 g by mouth 3 (three) times daily.   citalopram 40 MG tablet Commonly known as:  CELEXA Take 40 mg by  mouth daily.   dicyclomine 20 MG tablet Commonly known as:  BENTYL Take 20 mg by mouth 3 (three) times daily.   EPINEPHrine 0.3 mg/0.3 mL Soaj injection Commonly known as:  EPI-PEN Use as directed for life threatening allergic reactions   famotidine 40 MG tablet Commonly known as:  PEPCID Take 1 tablet (40 mg total) by mouth 2 (two) times daily.   gabapentin 800 MG tablet Commonly known as:  NEURONTIN Take 800 mg by mouth 2 (two) times daily as needed (pain.).   ipratropium 0.02 % nebulizer solution Commonly known as:  ATROVENT Can inhale the  contents of one vial in nebulizer every four to six hours as needed for cough or wheeze.  Mix with Levalbuterol   levalbuterol 1.25 MG/3ML nebulizer solution Commonly known as:  XOPENEX Use one vial in the nebulizer every 4-6 hours if needed for cough or wheeze.  Mix with Ipratropium as directed.    loratadine 10 MG tablet Commonly known as:  CLARITIN Take 10 mg by mouth daily.   methocarbamol 500 MG tablet Commonly known as:  ROBAXIN Take 1,000 mg by mouth 4 (four) times daily.   montelukast 10 MG tablet Commonly known as:  SINGULAIR Take 1 tablet by mouth once daily   pantoprazole 40 MG tablet Commonly known as:  PROTONIX Take 1 tablet (40 mg total) by mouth 2 (two) times daily. Started by:  Jessica PriestERIC J , MD       Past Medical History:  Diagnosis Date  . Anxiety   . Asthma   . Bartholin cyst LEFT SIDE  . Bicornuate uterus S/P EXCISION RIGHT UTERUS HORN 2007  . Broken thumb    Right  . Depression   . GERD (gastroesophageal reflux disease)   . Headache    sleep, xanax  . History of ovarian cyst   . PTSD (post-traumatic stress disorder)   . Schizophrenia (HCC)   . Sleep apnea     Past Surgical History:  Procedure Laterality Date  . APPENDECTOMY    . BARTHOLIN CYST MARSUPIALIZATION  07/18/2012   Procedure: BARTHOLIN CYST MARSUPIALIZATION;  Surgeon: Ok EdwardsJuan H Fernandez, MD;  [facility];  Service: Gynecology;  Laterality: Left;  Left Bartholin Duct Marsupilization. One hour.   Marland Kitchen. BARTHOLIN GLAND CYST EXCISION Left 02/11/2015   Procedure: BARTHOLIN GLAND EXCISION;  Surgeon: Mitchel HonourMegan Morris, DO;  Location: [facility];  Service: Gynecology;  Laterality: Left;  . CARDIAC CATHETERIZATION    . DIAGNOSTIC LAPAROSCOPY    . LACERATION REPAIR Left 05/29/2016   Procedure: REPAIR MULTIPLE LACERATIONS;  Surgeon: Mack Hookavid Thompson, MD;  Location: [facility];  Service: Orthopedics;  Laterality: Left;  laceration left forearm   . LAPAROSCOPIC APPENDECTOMY  AND RIGHT OVARIAN CYSTECTOMY  09-04-2004  . LAPAROSCOPIC EXCISION HYPOPLASTIC RIGHT UTERINE HORN AND RIGHT SALPINGOOPHECTOMY  03-18-2006  . LIGAMENT REPAIR Right 12/2018   Thumb, radial collateral ligament repair/reconstrution  . LUMBAR LAMINECTOMY/DECOMPRESSION MICRODISCECTOMY Left 01/12/2019   Procedure: Microdiscectomy Left Lumbar Three-Four;  Surgeon: Donalee Citrinram, Gary, MD;  [facility];  Service: Neurosurgery;  Laterality: Left;  Microdiscectomy Left Lumbar Three-Four  . TENDON REPAIR Left 05/29/2016   Procedure: TENDON REPAIR;  Surgeon: Mack Hookavid Thompson, MD;  Location: [facility];  Service: Orthopedics;  Laterality: Left;  . WISDOM TOOTH EXTRACTION      Review of systems negative except as noted in HPI / PMHx or noted below:  Review of Systems  Constitutional: Negative.   HENT: Negative.   Eyes: Negative.  Respiratory: Negative.   Cardiovascular: Negative.   Gastrointestinal: Negative.   Genitourinary: Negative.   Musculoskeletal: Negative.   Skin: Negative.   Neurological: Negative.   Endo/Heme/Allergies: Negative.   Psychiatric/Behavioral: Negative.      Objective:   Vitals:   06/10/19 1613  BP: 108/68  Pulse: 74  Resp: 16  SpO2: 99%   Height: 5' 4.25" (163.2 cm)  Weight: 137 lb 9.6 oz (62.4 kg)   Physical Exam Constitutional:      Appearance: She is not diaphoretic.  HENT:     Head: Normocephalic.     Right Ear: Tympanic membrane, ear canal and external ear normal.     Left Ear: Tympanic membrane, ear canal and external ear normal.     Nose: Nose normal. No mucosal edema or rhinorrhea.     Mouth/Throat:     Pharynx: Uvula midline. No oropharyngeal exudate.  Eyes:     Conjunctiva/sclera: Conjunctivae normal.  Neck:     Thyroid: No thyromegaly.     Trachea: Trachea normal. No tracheal tenderness or tracheal deviation.  Cardiovascular:     Rate and Rhythm: Normal rate and regular rhythm.     Heart sounds: Normal heart sounds, S1 normal and S2  normal. No murmur.  Pulmonary:     Effort: No respiratory distress.     Breath sounds: Normal breath sounds. No stridor. No wheezing or rales.  Lymphadenopathy:     Head:     Right side of head: No tonsillar adenopathy.     Left side of head: No tonsillar adenopathy.     Cervical: No cervical adenopathy.  Skin:    Findings: No erythema or rash.     Nails: There is no clubbing.   Neurological:     Mental Status: She is alert.     Diagnostics:    Spirometry was performed and demonstrated an FEV1 of 1.80 at 57 % of predicted.  Assessment and Plan:   1. Not well controlled moderate persistent asthma   2. Other allergic rhinitis   3. LPRD (laryngopharyngeal reflux disease)   4. Migraine syndrome   5. Diffuse arthralgia   6. Heavy tobacco smoker >10 cigarettes per day   7. Obstructive sleep apnea syndrome       1. Continue to perform Allergen avoidance measures - House dust mite  2. Continue to consider nicotine substitutes to replace tobacco smoke.    3. Continue toTreat and prevent inflammation:   A. Nasonex - one spray each nostril 1- 2 times per day (PA)  B. montelukast 10 mg - one tablet one time per day  C. Xolair injections  D. Immunotherapy injections   4. Continue 2 liter nasal cannula oxygen at night   5. Continue to Treat and prevent reflux:   A.  pantoprazole 40 mg 2 times per day  B.  Famotidine 40 mg 2 times per day  6. Complete your neurology evaluation for headache and arm issue  7. Obtain blood tests for arthralgia: RF, CCP, ANA w/reflex, SED, CRP  8. If needed:   A. nasal saline wash  B. Claritin 10 mg tablet 1 time per day  C. ProAir HFA 2 puffs   D. Xopenex + Ipratropium nebulizer every 4-6 hours.    E. mucinex DM - 2 tablets 2 times per day  9. Continue Immunotherapy and Xolair   10. Return to clinic in 12 weeks or earlier if problem   Lannette DonathKacie has pretty good control of her respiratory tract disease at  this point in time and I would like  to continue to have her use anti-inflammatory agents for her airway as noted above and I once again had a talk with her today about the need to taper down on her tobacco use and to plan at some point in the future to use a nicotine substitute to eliminate tobacco smoke exposure.  She was started on oxygen in February for a very significant hypoxic respiratory flare and she did resolve that issue and is now only using oxygen for sleeping at night as she does sleep much better with oxygen.  She has untreated sleep apnea that is being followed by her pulmonologist.  Until she can get reestablished with her pulmonologist we will keep her on oxygen at night.  I would like for her to restart her immunotherapy and Xolair as I think that these 2 agents will probably give her the best long-term benefit among all the other therapies that have been prescribed in the past.  She has a rather significant headache disorder and what sounds like a neuropathy and arthralgia and I have encouraged her to follow-up with neurology regarding her neurological issues and we will screen her blood for a systemic inflammatory condition giving rise to arthralgia.  I will contact her with the results of her blood test once they are available for review.  I will see her back in this clinic in 12 weeks or earlier if there is a problem.  Allena Katz, MD Allergy / Immunology [place]

## 2019-06-10 NOTE — Patient Instructions (Addendum)
  1. Continue to perform Allergen avoidance measures - House dust mite  2. Continue to consider nicotine substitutes to replace tobacco smoke.    3. Continue toTreat and prevent inflammation:   A. Nasonex - one spray each nostril 1- 2 times per day (PA)  B. montelukast 10 mg - one tablet one time per day  C. Xolair injections  D. Immunotherapy injections   4. Continue 2 liter nasal cannula oxygen at night   5. Continue to Treat and prevent reflux:   A.  pantoprazole 40 mg 2 times per day  B.  Famotidine 40 mg 2 times per day  6. Complete your neurology evaluation for headache and arm issue  7. Obtain blood tests for arthralgia: RF, CCP, ANA w/reflex, SED, CRP  8. If needed:   A. nasal saline wash  B. Claritin 10 mg tablet 1 time per day  C. ProAir HFA 2 puffs   D. Xopenex + Ipratropium nebulizer every 4-6 hours.    E. mucinex DM - 2 tablets 2 times per day  9. Continue  Immunotherapy and Xolair   10. Return to clinic in 12 weeks or earlier if problem

## 2019-06-11 ENCOUNTER — Encounter: Payer: Self-pay | Admitting: Allergy and Immunology

## 2019-06-11 NOTE — Telephone Encounter (Signed)
Advised patient of Rx and contact number

## 2019-06-11 NOTE — Telephone Encounter (Signed)
Patient has current Rx and approval on file with [facility]. I L/M with patient fiancee to have her contact me so I can give her the number and she can reach out to them to order her medication

## 2019-06-12 ENCOUNTER — Telehealth: Payer: Self-pay | Admitting: *Deleted

## 2019-06-12 ENCOUNTER — Telehealth: Payer: Self-pay

## 2019-06-12 NOTE — Telephone Encounter (Signed)
Received a fax from [facility] requesting PA for mometasone. Will fill out form and fax back in.

## 2019-06-12 NOTE — Telephone Encounter (Signed)
[name] with [facility] called and stated that [name]'s overnight O2 would not be covered by her insurance because she has OSA and currently has a sleep apnea machine. [name] stated that because she has OSA medicare states she NEEDS to be using her sleep apnea machine rather than the nasal cannula with oxygen.

## 2019-06-12 NOTE — Telephone Encounter (Signed)
[facility] is calling for a prior authorization on mometasone. Please return call [phone]

## 2019-06-15 ENCOUNTER — Ambulatory Visit (INDEPENDENT_AMBULATORY_CARE_PROVIDER_SITE_OTHER): Payer: Medicare Other

## 2019-06-15 DIAGNOSIS — J309 Allergic rhinitis, unspecified: Secondary | ICD-10-CM | POA: Diagnosis not present

## 2019-06-15 DIAGNOSIS — G43709 Chronic migraine without aura, not intractable, without status migrainosus: Secondary | ICD-10-CM | POA: Diagnosis not present

## 2019-06-15 DIAGNOSIS — F1911 Other psychoactive substance abuse, in remission: Secondary | ICD-10-CM | POA: Diagnosis not present

## 2019-06-15 DIAGNOSIS — M2550 Pain in unspecified joint: Secondary | ICD-10-CM | POA: Diagnosis not present

## 2019-06-15 DIAGNOSIS — G5632 Lesion of radial nerve, left upper limb: Secondary | ICD-10-CM | POA: Diagnosis not present

## 2019-06-15 NOTE — Telephone Encounter (Signed)
Please inform patient. She may need a new mask even if she has tried several in the past.

## 2019-06-15 NOTE — Telephone Encounter (Signed)
Left message for patient to return call to office. 

## 2019-06-15 NOTE — Telephone Encounter (Signed)
Spoke with patient when she came into the office for her allergy shots. Informed her of this information and she stated she would see about a different mask.

## 2019-06-16 ENCOUNTER — Other Ambulatory Visit: Payer: Self-pay

## 2019-06-17 ENCOUNTER — Ambulatory Visit (INDEPENDENT_AMBULATORY_CARE_PROVIDER_SITE_OTHER): Payer: Medicare Other

## 2019-06-17 DIAGNOSIS — J4550 Severe persistent asthma, uncomplicated: Secondary | ICD-10-CM | POA: Diagnosis not present

## 2019-06-17 LAB — RHEUMATOID FACTOR: Rheumatoid fact SerPl-aCnc: <10 IU/mL (ref 0.0–13.9)

## 2019-06-17 LAB — SEDIMENTATION RATE: Sed Rate: 2 mm/hr (ref 0–32)

## 2019-06-17 LAB — CYCLIC CITRUL PEPTIDE ANTIBODY, IGG/IGA: Cyclic Citrullin Peptide Ab: 4 units (ref 0–19)

## 2019-06-17 LAB — ANA W/REFLEX: Anti Nuclear Antibody (ANA): NEGATIVE

## 2019-06-17 LAB — C-REACTIVE PROTEIN: CRP: <1 mg/L (ref 0–10)

## 2019-06-22 DIAGNOSIS — R51 Headache: Secondary | ICD-10-CM | POA: Diagnosis not present

## 2019-06-22 DIAGNOSIS — G43709 Chronic migraine without aura, not intractable, without status migrainosus: Secondary | ICD-10-CM | POA: Diagnosis not present

## 2019-06-22 DIAGNOSIS — I6782 Cerebral ischemia: Secondary | ICD-10-CM | POA: Diagnosis not present

## 2019-06-24 ENCOUNTER — Ambulatory Visit (INDEPENDENT_AMBULATORY_CARE_PROVIDER_SITE_OTHER): Payer: Medicare Other | Admitting: *Deleted

## 2019-06-24 DIAGNOSIS — J309 Allergic rhinitis, unspecified: Secondary | ICD-10-CM

## 2019-07-01 DIAGNOSIS — R9082 White matter disease, unspecified: Secondary | ICD-10-CM | POA: Diagnosis not present

## 2019-07-01 DIAGNOSIS — G35 Multiple sclerosis: Secondary | ICD-10-CM | POA: Diagnosis not present

## 2019-07-01 DIAGNOSIS — R200 Anesthesia of skin: Secondary | ICD-10-CM | POA: Diagnosis not present

## 2019-07-07 DIAGNOSIS — R200 Anesthesia of skin: Secondary | ICD-10-CM | POA: Diagnosis not present

## 2019-07-07 DIAGNOSIS — R202 Paresthesia of skin: Secondary | ICD-10-CM | POA: Diagnosis not present

## 2019-07-07 DIAGNOSIS — G43709 Chronic migraine without aura, not intractable, without status migrainosus: Secondary | ICD-10-CM | POA: Diagnosis not present

## 2019-07-07 DIAGNOSIS — R930 Abnormal findings on diagnostic imaging of skull and head, not elsewhere classified: Secondary | ICD-10-CM | POA: Diagnosis not present

## 2019-07-15 ENCOUNTER — Other Ambulatory Visit: Payer: Self-pay

## 2019-07-15 ENCOUNTER — Ambulatory Visit (INDEPENDENT_AMBULATORY_CARE_PROVIDER_SITE_OTHER): Payer: Medicare Other | Admitting: *Deleted

## 2019-07-15 DIAGNOSIS — J4550 Severe persistent asthma, uncomplicated: Secondary | ICD-10-CM

## 2019-07-16 DIAGNOSIS — H539 Unspecified visual disturbance: Secondary | ICD-10-CM | POA: Diagnosis not present

## 2019-07-16 DIAGNOSIS — R2681 Unsteadiness on feet: Secondary | ICD-10-CM | POA: Diagnosis not present

## 2019-07-16 DIAGNOSIS — R413 Other amnesia: Secondary | ICD-10-CM | POA: Diagnosis not present

## 2019-07-16 DIAGNOSIS — R251 Tremor, unspecified: Secondary | ICD-10-CM | POA: Diagnosis not present

## 2019-07-27 DIAGNOSIS — H5319 Other subjective visual disturbances: Secondary | ICD-10-CM | POA: Diagnosis not present

## 2019-07-27 DIAGNOSIS — H04123 Dry eye syndrome of bilateral lacrimal glands: Secondary | ICD-10-CM | POA: Diagnosis not present

## 2019-07-27 DIAGNOSIS — H532 Diplopia: Secondary | ICD-10-CM | POA: Diagnosis not present

## 2019-07-27 DIAGNOSIS — H52203 Unspecified astigmatism, bilateral: Secondary | ICD-10-CM | POA: Diagnosis not present

## 2019-07-27 DIAGNOSIS — G43709 Chronic migraine without aura, not intractable, without status migrainosus: Secondary | ICD-10-CM | POA: Diagnosis not present

## 2019-08-03 DIAGNOSIS — R51 Headache: Secondary | ICD-10-CM | POA: Diagnosis not present

## 2019-08-03 DIAGNOSIS — Z5181 Encounter for therapeutic drug level monitoring: Secondary | ICD-10-CM | POA: Diagnosis not present

## 2019-08-03 DIAGNOSIS — F959 Tic disorder, unspecified: Secondary | ICD-10-CM | POA: Diagnosis not present

## 2019-08-03 DIAGNOSIS — Z90710 Acquired absence of both cervix and uterus: Secondary | ICD-10-CM | POA: Diagnosis not present

## 2019-08-03 DIAGNOSIS — R5383 Other fatigue: Secondary | ICD-10-CM | POA: Diagnosis not present

## 2019-08-03 DIAGNOSIS — Z8742 Personal history of other diseases of the female genital tract: Secondary | ICD-10-CM | POA: Diagnosis not present

## 2019-08-03 DIAGNOSIS — R6889 Other general symptoms and signs: Secondary | ICD-10-CM | POA: Diagnosis not present

## 2019-08-12 ENCOUNTER — Ambulatory Visit: Payer: Self-pay

## 2019-08-14 DIAGNOSIS — R509 Fever, unspecified: Secondary | ICD-10-CM | POA: Diagnosis not present

## 2019-08-14 DIAGNOSIS — J209 Acute bronchitis, unspecified: Secondary | ICD-10-CM | POA: Diagnosis not present

## 2019-08-14 DIAGNOSIS — R05 Cough: Secondary | ICD-10-CM | POA: Diagnosis not present

## 2019-08-14 DIAGNOSIS — Z20828 Contact with and (suspected) exposure to other viral communicable diseases: Secondary | ICD-10-CM | POA: Diagnosis not present

## 2019-08-14 DIAGNOSIS — J0100 Acute maxillary sinusitis, unspecified: Secondary | ICD-10-CM | POA: Diagnosis not present

## 2019-08-24 DIAGNOSIS — F314 Bipolar disorder, current episode depressed, severe, without psychotic features: Secondary | ICD-10-CM | POA: Diagnosis not present

## 2019-08-24 DIAGNOSIS — R45851 Suicidal ideations: Secondary | ICD-10-CM | POA: Diagnosis not present

## 2019-08-24 DIAGNOSIS — F331 Major depressive disorder, recurrent, moderate: Secondary | ICD-10-CM | POA: Diagnosis not present

## 2019-08-24 DIAGNOSIS — S9031XA Contusion of right foot, initial encounter: Secondary | ICD-10-CM | POA: Diagnosis not present

## 2019-08-24 DIAGNOSIS — S99921A Unspecified injury of right foot, initial encounter: Secondary | ICD-10-CM | POA: Diagnosis not present

## 2019-08-24 DIAGNOSIS — F17210 Nicotine dependence, cigarettes, uncomplicated: Secondary | ICD-10-CM | POA: Diagnosis not present

## 2019-08-24 DIAGNOSIS — S99911A Unspecified injury of right ankle, initial encounter: Secondary | ICD-10-CM | POA: Diagnosis not present

## 2019-08-24 DIAGNOSIS — Z046 Encounter for general psychiatric examination, requested by authority: Secondary | ICD-10-CM | POA: Diagnosis not present

## 2019-08-25 DIAGNOSIS — S9031XA Contusion of right foot, initial encounter: Secondary | ICD-10-CM | POA: Diagnosis not present

## 2019-08-25 DIAGNOSIS — J45909 Unspecified asthma, uncomplicated: Secondary | ICD-10-CM | POA: Diagnosis present

## 2019-08-25 DIAGNOSIS — R251 Tremor, unspecified: Secondary | ICD-10-CM | POA: Diagnosis present

## 2019-08-25 DIAGNOSIS — F4481 Dissociative identity disorder: Secondary | ICD-10-CM | POA: Diagnosis present

## 2019-08-25 DIAGNOSIS — K219 Gastro-esophageal reflux disease without esophagitis: Secondary | ICD-10-CM | POA: Diagnosis present

## 2019-08-25 DIAGNOSIS — G4730 Sleep apnea, unspecified: Secondary | ICD-10-CM | POA: Diagnosis present

## 2019-08-25 DIAGNOSIS — R45851 Suicidal ideations: Secondary | ICD-10-CM | POA: Diagnosis present

## 2019-08-25 DIAGNOSIS — F314 Bipolar disorder, current episode depressed, severe, without psychotic features: Secondary | ICD-10-CM | POA: Diagnosis not present

## 2019-08-25 DIAGNOSIS — F17210 Nicotine dependence, cigarettes, uncomplicated: Secondary | ICD-10-CM | POA: Diagnosis not present

## 2019-08-25 DIAGNOSIS — F332 Major depressive disorder, recurrent severe without psychotic features: Secondary | ICD-10-CM | POA: Diagnosis not present

## 2019-08-25 DIAGNOSIS — Z046 Encounter for general psychiatric examination, requested by authority: Secondary | ICD-10-CM | POA: Diagnosis not present

## 2019-09-03 ENCOUNTER — Ambulatory Visit: Payer: Medicare Other | Admitting: Allergy and Immunology

## 2019-09-09 DIAGNOSIS — F29 Unspecified psychosis not due to a substance or known physiological condition: Secondary | ICD-10-CM | POA: Diagnosis not present

## 2019-09-10 ENCOUNTER — Ambulatory Visit: Payer: Self-pay | Admitting: Allergy and Immunology

## 2019-09-21 DIAGNOSIS — R413 Other amnesia: Secondary | ICD-10-CM | POA: Diagnosis not present

## 2019-10-09 DIAGNOSIS — M5126 Other intervertebral disc displacement, lumbar region: Secondary | ICD-10-CM | POA: Diagnosis not present

## 2019-10-14 DIAGNOSIS — R413 Other amnesia: Secondary | ICD-10-CM | POA: Diagnosis not present

## 2019-10-21 DIAGNOSIS — F17210 Nicotine dependence, cigarettes, uncomplicated: Secondary | ICD-10-CM | POA: Diagnosis not present

## 2019-10-21 DIAGNOSIS — R569 Unspecified convulsions: Secondary | ICD-10-CM | POA: Diagnosis not present

## 2020-03-18 DIAGNOSIS — R0982 Postnasal drip: Secondary | ICD-10-CM | POA: Diagnosis not present

## 2020-03-18 DIAGNOSIS — F419 Anxiety disorder, unspecified: Secondary | ICD-10-CM | POA: Diagnosis not present

## 2020-03-18 DIAGNOSIS — G4700 Insomnia, unspecified: Secondary | ICD-10-CM | POA: Diagnosis not present

## 2020-05-04 ENCOUNTER — Ambulatory Visit: Payer: Medicare Other | Admitting: Allergy and Immunology

## 2020-09-19 ENCOUNTER — Encounter: Payer: Self-pay | Admitting: Allergy and Immunology

## 2020-09-19 ENCOUNTER — Ambulatory Visit (INDEPENDENT_AMBULATORY_CARE_PROVIDER_SITE_OTHER): Payer: Medicare Other | Admitting: Allergy and Immunology

## 2020-09-19 ENCOUNTER — Other Ambulatory Visit: Payer: Self-pay

## 2020-09-19 VITALS — BP 122/72 | HR 84 | Resp 18 | Ht 64.0 in | Wt 125.4 lb

## 2020-09-19 DIAGNOSIS — J4550 Severe persistent asthma, uncomplicated: Secondary | ICD-10-CM

## 2020-09-19 DIAGNOSIS — L308 Other specified dermatitis: Secondary | ICD-10-CM

## 2020-09-19 DIAGNOSIS — K219 Gastro-esophageal reflux disease without esophagitis: Secondary | ICD-10-CM | POA: Diagnosis not present

## 2020-09-19 DIAGNOSIS — J3089 Other allergic rhinitis: Secondary | ICD-10-CM | POA: Diagnosis not present

## 2020-09-19 DIAGNOSIS — R0902 Hypoxemia: Secondary | ICD-10-CM

## 2020-09-19 DIAGNOSIS — L989 Disorder of the skin and subcutaneous tissue, unspecified: Secondary | ICD-10-CM

## 2020-09-19 MED ORDER — HALOBETASOL PROPIONATE 0.05 % EX OINT: TOPICAL_OINTMENT | CUTANEOUS | 0 refills | Status: AC

## 2020-09-19 MED ORDER — MOMETASONE FUROATE 50 MCG/ACT NA SUSP: NASAL | 5 refills | Status: AC

## 2020-09-19 MED ORDER — ALBUTEROL SULFATE HFA 108 (90 BASE) MCG/ACT IN AERS: INHALATION_SPRAY | RESPIRATORY_TRACT | 1 refills | Status: DC

## 2020-09-19 MED ORDER — LORATADINE 10 MG PO TABS: ORAL_TABLET | ORAL | 5 refills | Status: DC

## 2020-09-19 MED ORDER — IPRATROPIUM BROMIDE 0.02 % IN SOLN: RESPIRATORY_TRACT | 1 refills | Status: DC

## 2020-09-19 MED ORDER — LEVALBUTEROL HCL 1.25 MG/3ML IN NEBU: INHALATION_SOLUTION | RESPIRATORY_TRACT | 1 refills | Status: DC

## 2020-09-19 MED ORDER — BREO ELLIPTA 100-25 MCG/INH IN AEPB: INHALATION_SPRAY | RESPIRATORY_TRACT | 5 refills | Status: DC

## 2020-09-19 MED ORDER — OMEPRAZOLE 40 MG PO CPDR: DELAYED_RELEASE_CAPSULE | ORAL | 5 refills | Status: DC

## 2020-09-19 MED ORDER — MONTELUKAST SODIUM 10 MG PO TABS: 10.0000 mg | ORAL_TABLET | Freq: Every day | ORAL | 5 refills | Status: DC

## 2020-09-19 NOTE — Patient Instructions (Addendum)
  1. Continue to perform Allergen avoidance measures - House dust mite  2. Treat and prevent inflammation:   A. Nasonex - one spray each nostril 1- 2 times per day (PA)  B. montelukast 10 mg - one tablet one time per day  C. Breo 100 - 1 inhalation 1 time per day   3. Treat and prevent reflux:   A.  Omeprazole 40 mg - 2 times per day  4. If needed:   A. nasal saline wash  B. Claritin 10 mg tablet 1 time per day  C. ProAir HFA 2 puffs   D. Xopenex + Ipratropium nebulizer every 4-6 hours.    E. Ultravate cream apply to skin lesion at onset daily until resolved  5. Obtain nocturnal oximetry study  6. Return to clinic in 4 weeks or earlier if problem

## 2020-09-19 NOTE — Progress Notes (Signed)
[name]   Follow-up Note  Referring Provider: No ref. provider found Primary Provider: Patient, No Pcp Per Date of Office Visit: 09/19/2020  Subjective:   [name] (DOB: 07/26/1986) is a 34 y.o. female who [facility] in re-evaluation of the following:  HPI: [name] returns to this clinic in evaluation of asthma and allergic rhinitis and LPR and tobacco smoke exposure.  I last saw her in this clinic on 10 June 2019.  She is out of all of her medications as she has been since October 2020.  She has been having very significant problems with both her airway and reflux.  All of her medical care was withdrawn when she went to prison.  She is wheezing and coughing and must use a bronchodilator on a daily basis.  She is not using any controller agent.  It does not sound as though she has required a systemic steroid to treat an exacerbation of her lung condition.  She is no longer using oxygen.  She is not using a CPAP machine.  She was using nocturnal oxygen supplementation in the past which did help her significantly.  She has very bad and horrible reflux.  She is burping and regurgitation and burning in her throat and has lots of throat clearing and choking-like cough.  She describes these itchy spots that develop on her body.  She will get a small punctate red area and then she will develop a little "head" on the top.  She will excoriate this head and then it takes a long time for these lesions to resolve.  She has been having these on and off since October 2020.  These do not appear to be secondary to bug bites or fleas.  No one else in the family is developing these issues.  Allergies as of 09/19/2020      Reactions   Adhesive [tape] Rash   PAPER TAPE IS OKAY      Medication List    albuterol 108 (90 Base) MCG/ACT inhaler Commonly known as: ProAir HFA Inhale two puffs every four to six  hours as needed for cough or wheeze.       Past Medical History:  Diagnosis Date  . Anxiety   . Asthma   . Bartholin cyst LEFT SIDE  . Bicornuate uterus S/P EXCISION RIGHT UTERUS HORN 2007  . Broken thumb    Right  . Depression   . GERD (gastroesophageal reflux disease)   . Headache    sleep, xanax  . History of ovarian cyst   . PTSD (post-traumatic stress disorder)   . Schizophrenia (HCC)   . Sleep apnea     Past Surgical History:  Procedure Laterality Date  . APPENDECTOMY    . BARTHOLIN CYST MARSUPIALIZATION  07/18/2012   Procedure: BARTHOLIN CYST MARSUPIALIZATION;  Surgeon: Ok Edwards, MD;  Location: [facility];  Service: Gynecology;  Laterality: Left;  Left Bartholin Duct Marsupilization. One hour.   Marland Kitchen BARTHOLIN GLAND CYST EXCISION Left 02/11/2015   Procedure: BARTHOLIN GLAND EXCISION;  Surgeon: Mitchel Honour, DO;  Location: [facility];  Service: Gynecology;  Laterality: Left;  . CARDIAC CATHETERIZATION    . DIAGNOSTIC LAPAROSCOPY    . LACERATION REPAIR Left 05/29/2016   Procedure: REPAIR MULTIPLE LACERATIONS;  Surgeon: Mack Hook, MD;  Location: [facility];  Service: Orthopedics;  Laterality: Left;  laceration left forearm   .  LAPAROSCOPIC APPENDECTOMY AND RIGHT OVARIAN CYSTECTOMY  09-04-2004  . LAPAROSCOPIC EXCISION HYPOPLASTIC RIGHT UTERINE HORN AND RIGHT SALPINGOOPHECTOMY  03-18-2006  . LIGAMENT REPAIR Right 12/2018   Thumb, radial collateral ligament repair/reconstrution  . LUMBAR LAMINECTOMY/DECOMPRESSION MICRODISCECTOMY Left 01/12/2019   Procedure: Microdiscectomy Left Lumbar Three-Four;  Surgeon: Donalee Citrin, MD;  Location: [facility];  Service: Neurosurgery;  Laterality: Left;  Microdiscectomy Left Lumbar Three-Four  . TENDON REPAIR Left 05/29/2016   Procedure: TENDON REPAIR;  Surgeon: Mack Hook, MD;  Location: [facility];  Service: Orthopedics;  Laterality: Left;  . WISDOM TOOTH EXTRACTION      Review of systems  negative except as noted in HPI / PMHx or noted below:  Review of Systems  Constitutional: Negative.   HENT: Negative.   Eyes: Negative.   Respiratory: Negative.   Cardiovascular: Negative.   Gastrointestinal: Negative.   Genitourinary: Negative.   Musculoskeletal: Negative.   Skin: Negative.   Neurological: Negative.   Endo/Heme/Allergies: Negative.   Psychiatric/Behavioral: Negative.      Objective:   There were no vitals filed for this visit.        Physical Exam Constitutional:      Appearance: She is not diaphoretic.  HENT:     Head: Normocephalic.     Right Ear: Tympanic membrane, ear canal and external ear normal.     Left Ear: Tympanic membrane, ear canal and external ear normal.     Nose: Nose normal. No mucosal edema or rhinorrhea.     Mouth/Throat:     Pharynx: Uvula midline. No oropharyngeal exudate.  Eyes:     Conjunctiva/sclera: Conjunctivae normal.  Neck:     Thyroid: No thyromegaly.     Trachea: Trachea normal. No tracheal tenderness or tracheal deviation.  Cardiovascular:     Rate and Rhythm: Normal rate and regular rhythm.     Heart sounds: Normal heart sounds, S1 normal and S2 normal. No murmur heard.   Pulmonary:     Effort: No respiratory distress.     Breath sounds: Normal breath sounds. No stridor. No wheezing or rales.  Lymphadenopathy:     Head:     Right side of head: No tonsillar adenopathy.     Left side of head: No tonsillar adenopathy.     Cervical: No cervical adenopathy.  Skin:    Findings: No erythema or rash (aproximately 6 unroofef papules extremities).     Nails: There is no clubbing.  Neurological:     Mental Status: She is alert.     Diagnostics:    Spirometry was performed and demonstrated an FEV1 of 2.06 at 66 % of predicted.   Assessment and Plan:   1. Not well controlled severe persistent asthma   2. Other allergic rhinitis   3. LPRD (laryngopharyngeal reflux disease)   4. Hypoxemia   5. Inflammatory  dermatosis       1. Continue to perform Allergen avoidance measures - House dust mite  2. Treat and prevent inflammation:   A. Nasonex - one spray each nostril 1- 2 times per day (PA)  B. montelukast 10 mg - one tablet one time per day  C. Breo 100 - 1 inhalation 1 time per day   3. Treat and prevent reflux:   A.  Omeprazole 40 mg - 2 times per day  4. If needed:   A. nasal saline wash  B. Claritin 10 mg tablet 1 time per day  C. ProAir HFA 2 puffs   D. Xopenex +  Ipratropium nebulizer every 4-6 hours.    E. Ultravate cream apply to skin lesion at onset daily until resolved  5. Obtain nocturnal oximetry study  6. Return to clinic in 4 weeks or earlier if problem   We will start [facility] on a collection of anti-inflammatory agents for her airway and therapy directed against reflux as noted above.  Assuming she does well with this plan I will see her back in his clinic in 4 weeks.  As well, she can apply Ultravate cream at the initial onset of her recurrent erythematous punctate skin lesions as they do sound somewhat inflammatory in nature.  Laurette Schimke, MD Allergy / Immunology [facility]

## 2020-09-20 ENCOUNTER — Telehealth: Payer: Self-pay

## 2020-09-20 ENCOUNTER — Encounter: Payer: Self-pay | Admitting: Allergy and Immunology

## 2020-09-20 NOTE — Telephone Encounter (Signed)
Faxed order for overnight oximetry on room air to [facility].

## 2020-10-06 ENCOUNTER — Telehealth: Payer: Self-pay | Admitting: Allergy and Immunology

## 2020-10-06 NOTE — Telephone Encounter (Signed)
PT called stating she did not have any nebulizer solution available for pick up at [facility]. Please resend atrovent and xopenex nebulizer solution. PT also requesting nystatin because she thinks she has thrush. Pharmacy is [facility] on [address]

## 2020-10-07 ENCOUNTER — Other Ambulatory Visit: Payer: Self-pay | Admitting: *Deleted

## 2020-10-07 MED ORDER — IPRATROPIUM BROMIDE 0.02 % IN SOLN: RESPIRATORY_TRACT | 1 refills | Status: DC

## 2020-10-07 MED ORDER — LEVALBUTEROL HCL 1.25 MG/3ML IN NEBU: INHALATION_SOLUTION | RESPIRATORY_TRACT | 1 refills | Status: DC

## 2020-10-07 NOTE — Telephone Encounter (Signed)
Her prescriptions were sent for her neb solutions. I have resent them. Please advise if it is okay for her to have Nystatin.

## 2020-10-10 ENCOUNTER — Other Ambulatory Visit: Payer: Self-pay | Admitting: *Deleted

## 2020-10-10 MED ORDER — NYSTATIN 100000 UNIT/ML MT SUSP: OROMUCOSAL | 0 refills | Status: AC

## 2020-10-10 NOTE — Telephone Encounter (Signed)
Can also refill nystatin oral solution-5 mL swish and swallow 3 times per day during periods of thrush.

## 2020-10-10 NOTE — Telephone Encounter (Signed)
RX sent for Nystatin.

## 2020-10-17 ENCOUNTER — Ambulatory Visit: Payer: Medicare Other | Admitting: Allergy and Immunology

## 2020-10-18 ENCOUNTER — Other Ambulatory Visit: Payer: Self-pay | Admitting: Allergy and Immunology

## 2020-10-27 ENCOUNTER — Other Ambulatory Visit: Payer: Self-pay | Admitting: Allergy and Immunology

## 2020-10-27 NOTE — Telephone Encounter (Signed)
Patient states she does not need this refill. Her pharmacy must of had the medication on auto refill.

## 2020-10-27 NOTE — Telephone Encounter (Signed)
Called patient and left a voicemail asking for her to return call in regards to refill for Nystatin. Wanted to see if this is a medication she takes regularly or if she is having a current issue with thrush.

## 2020-10-31 ENCOUNTER — Telehealth: Payer: Self-pay | Admitting: Allergy and Immunology

## 2020-10-31 ENCOUNTER — Ambulatory Visit (INDEPENDENT_AMBULATORY_CARE_PROVIDER_SITE_OTHER): Payer: Medicare Other | Admitting: Allergy and Immunology

## 2020-10-31 ENCOUNTER — Other Ambulatory Visit: Payer: Self-pay

## 2020-10-31 ENCOUNTER — Encounter: Payer: Self-pay | Admitting: Allergy and Immunology

## 2020-10-31 VITALS — BP 102/74 | HR 88 | Resp 14

## 2020-10-31 DIAGNOSIS — L989 Disorder of the skin and subcutaneous tissue, unspecified: Secondary | ICD-10-CM

## 2020-10-31 DIAGNOSIS — J4550 Severe persistent asthma, uncomplicated: Secondary | ICD-10-CM

## 2020-10-31 DIAGNOSIS — K219 Gastro-esophageal reflux disease without esophagitis: Secondary | ICD-10-CM

## 2020-10-31 DIAGNOSIS — J3089 Other allergic rhinitis: Secondary | ICD-10-CM

## 2020-10-31 NOTE — Progress Notes (Signed)
[place] - [place] - [place] - [place] - [place]   Follow-up Note  Referring Provider: No ref. provider found Primary Provider: Patient, No Pcp Per Date of Office Visit: 10/31/2020  Subjective:   [name] (DOB: 03/20/1986) is a 34 y.o. female who [facility] on 10[facility]/2020 in re-evaluation of the following:  HPI: [name] returns to this clinic in evaluation of asthma and allergic rhinitis and LPR and tobacco smoke exposure and inflammatory dermatosis.  I last saw her in this clinic on 19 September 2020.  Her airway is still pretty inflamed and she still has lots of coughing especially upon awakening in the morning and feels as though she makes lots of mucus.  She does not think that the use of Breo has helped her very much.  She still uses a short acting bronchodilator pretty extensively.  She continues to smoke.  She had to use some nystatin for an episode of thrush.  She has had very little issues with her upper airways.  Her reflux is under much better control at this point in time.  She has not had any of her skin issues.  She has had no need to use any topical agent.  Allergies as of 10/31/2020      Reactions   Adhesive [tape] Rash   PAPER TAPE IS OKAY      Medication List      albuterol 108 (90 Base) MCG/ACT inhaler Commonly known as: Ventolin HFA INHALE 2 PUFFS BY MOUTH EVERY 4 TO 6 HOURS AS NEEDED FOR COUGH OR WHEEZING   Breo Ellipta 100-25 MCG/INH Aepb Generic drug: fluticasone furoate-vilanterol Inhale one dose once daily to prevent cough or wheeze.  Rinse, gargle, and spit after use.   halobetasol 0.05 % ointment Commonly known as: ULTRAVATE Can apply to skin lesions once daily until resolved.   ipratropium 0.02 % nebulizer solution Commonly known as: ATROVENT Can use one vial in nebulizer every four to six hours as needed for cough or wheeze.   levalbuterol 1.25 MG/3ML nebulizer solution Commonly known as:  XOPENEX Can use one vial in nebulizer every four to six hours as needed for cough or wheeze.   loratadine 10 MG tablet Commonly known as: CLARITIN Can take one tablet by mouth once daily if needed.   mometasone 50 MCG/ACT nasal spray Commonly known as: NASONEX Use one spray in each nostril one to two times daily.   montelukast 10 MG tablet Commonly known as: SINGULAIR Take 1 tablet (10 mg total) by mouth at bedtime.   nystatin 100000 UNIT/ML suspension Commonly known as: MYCOSTATIN Swish and swallow three times daily for thrush   omeprazole 40 MG capsule Commonly known as: PRILOSEC Take one capsule by mouth twice daily.       Past Medical History:  Diagnosis Date  . Anxiety   . Asthma   . Bartholin cyst LEFT SIDE  . Bicornuate uterus S/P EXCISION RIGHT UTERUS HORN 2007  . Broken thumb    Right  . Depression   . GERD (gastroesophageal reflux disease)   . Headache    sleep, xanax  . History of ovarian cyst   . PTSD (post-traumatic stress disorder)   . Schizophrenia (HCC)   . Sleep apnea     Past Surgical History:  Procedure Laterality Date  . APPENDECTOMY    . BARTHOLIN CYST MARSUPIALIZATION  07/18/2012   Procedure: BARTHOLIN CYST MARSUPIALIZATION;  Surgeon: Ok Edwards, MD;  Location: [facility];  Service: Gynecology;  Laterality: Left;  Left Bartholin Duct Marsupilization. One hour.   Marland Kitchen BARTHOLIN GLAND CYST EXCISION Left 02/11/2015   Procedure: BARTHOLIN GLAND EXCISION;  Surgeon: Mitchel Honour, DO;  Location: [facility];  Service: Gynecology;  Laterality: Left;  . CARDIAC CATHETERIZATION    . DIAGNOSTIC LAPAROSCOPY    . LACERATION REPAIR Left 05/29/2016   Procedure: REPAIR MULTIPLE LACERATIONS;  Surgeon: Mack Hook, MD;  Location: [facility];  Service: Orthopedics;  Laterality: Left;  laceration left forearm   . LAPAROSCOPIC APPENDECTOMY AND RIGHT OVARIAN CYSTECTOMY  09-04-2004  . LAPAROSCOPIC EXCISION HYPOPLASTIC RIGHT  UTERINE HORN AND RIGHT SALPINGOOPHECTOMY  03-18-2006  . LIGAMENT REPAIR Right 12/2018   Thumb, radial collateral ligament repair/reconstrution  . LUMBAR LAMINECTOMY/DECOMPRESSION MICRODISCECTOMY Left 01/12/2019   Procedure: Microdiscectomy Left Lumbar Three-Four;  Surgeon: Donalee Citrin, MD;  Location: [facility];  Service: Neurosurgery;  Laterality: Left;  Microdiscectomy Left Lumbar Three-Four  . TENDON REPAIR Left 05/29/2016   Procedure: TENDON REPAIR;  Surgeon: Mack Hook, MD;  Location: [facility];  Service: Orthopedics;  Laterality: Left;  . WISDOM TOOTH EXTRACTION      Review of systems negative except as noted in HPI / PMHx or noted below:  Review of Systems  Constitutional: Negative.   HENT: Negative.   Eyes: Negative.   Respiratory: Negative.   Cardiovascular: Negative.   Gastrointestinal: Negative.   Genitourinary: Negative.   Musculoskeletal: Negative.   Skin: Negative.   Neurological: Negative.   Endo/Heme/Allergies: Negative.   Psychiatric/Behavioral: Negative.      Objective:   Vitals:   10/31/20 1508  BP: 102/74  Pulse: 88  Resp: 14  SpO2: 98%          Physical Exam Constitutional:      Appearance: She is not diaphoretic.  HENT:     Head: Normocephalic.     Right Ear: Tympanic membrane, ear canal and external ear normal.     Left Ear: Tympanic membrane, ear canal and external ear normal.     Nose: Nose normal. No mucosal edema or rhinorrhea.     Mouth/Throat:     Pharynx: Uvula midline. No oropharyngeal exudate.  Eyes:     Conjunctiva/sclera: Conjunctivae normal.  Neck:     Thyroid: No thyromegaly.     Trachea: Trachea normal. No tracheal tenderness or tracheal deviation.  Cardiovascular:     Rate and Rhythm: Normal rate and regular rhythm.     Heart sounds: Normal heart sounds, S1 normal and S2 normal. No murmur heard.   Pulmonary:     Effort: No respiratory distress.     Breath sounds: Normal breath sounds. No stridor. No  wheezing or rales.  Lymphadenopathy:     Head:     Right side of head: No tonsillar adenopathy.     Left side of head: No tonsillar adenopathy.     Cervical: No cervical adenopathy.  Skin:    Findings: No erythema or rash.     Nails: There is no clubbing.  Neurological:     Mental Status: She is alert.     Diagnostics:    Spirometry was performed and demonstrated an FEV1 of 2.16 at 69 % of predicted.  Assessment and Plan:   1. Not well controlled severe persistent asthma   2. Other allergic rhinitis   3. LPRD (laryngopharyngeal reflux disease)   4. Inflammatory dermatosis       1. Continue to perform Allergen avoidance measures - House  dust mite  2. Treat and prevent inflammation:   A. Flonase - one spray each nostril 1- 2 times per day   B. montelukast 10 mg - one tablet one time per day  C. Combination of the following inhalers:   - Symbicort 160 - 2 inhalations 2 times per day with spacer   - Spiriva 1.25 Respimat - 2 inhalations 1 time per day   3. Treat and prevent reflux:   A.  Omeprazole 40 mg - 2 times per day  4. If needed:   A. nasal saline wash  B. Claritin 10 mg tablet 1 time per day  C. ProAir HFA 2 puffs   D. Xopenex + Ipratropium nebulizer every 4-6 hours.    E. Ultravate cream apply to skin lesion at onset daily until resolved  5. Further treatment?   6. Return to clinic in 4 weeks or earlier if problem   Fortunately Latona's reflux disease is under good control on her current plan and she has not had any skin problems but her airway is still a mess.  Her inflammation is secondary to a combination of dust mite allergy and tobacco smoke exposure.  We will try to have her utilize a combination of therapy noted above to see if we get better control of this condition and I once again had a talk with her today about the need to eliminate smoke exposure.  We will see what happens over the course of the next 4 weeks and she will contact me during the  interval should there be a problem.  Laurette Schimke, MD Allergy / Immunology [facility]

## 2020-10-31 NOTE — Patient Instructions (Addendum)
°  1. Continue to perform Allergen avoidance measures - House dust mite  2. Treat and prevent inflammation:   A. Flonase - one spray each nostril 1- 2 times per day   B. montelukast 10 mg - one tablet one time per day  C. Combination of the following inhalers:   - Symbicort 160 - 2 inhalations 2 times per day with spacer   - Spiriva 1.25 Respimat - 2 inhalations 1 time per day   3. Treat and prevent reflux:   A.  Omeprazole 40 mg - 2 times per day  4. If needed:   A. nasal saline wash  B. Claritin 10 mg tablet 1 time per day  C. ProAir HFA 2 puffs   D. Xopenex + Ipratropium nebulizer every 4-6 hours.    E. Ultravate cream apply to skin lesion at onset daily until resolved  5. Further treatment?   6. Return to clinic in 4 weeks or earlier if problem

## 2020-10-31 NOTE — Telephone Encounter (Signed)
Dr. Lucie Leather would like [name] to restart Xolair.

## 2020-11-01 ENCOUNTER — Telehealth: Payer: Self-pay

## 2020-11-01 ENCOUNTER — Encounter: Payer: Self-pay | Admitting: Allergy and Immunology

## 2020-11-01 MED ORDER — SPIRIVA RESPIMAT 1.25 MCG/ACT IN AERS: 2.0000 | INHALATION_SPRAY | Freq: Every day | RESPIRATORY_TRACT | 1 refills | Status: DC

## 2020-11-01 NOTE — Telephone Encounter (Signed)
Left message for patient to call the office.  Please let her know that Dr. Lucie Leather received her Overnight Oximetry study results and her oxygen was ok at night.

## 2020-11-01 NOTE — Telephone Encounter (Signed)
Informed Clint of her results and she stated Dr. Lucie Leather informed her of the results at her appointment.

## 2020-11-03 ENCOUNTER — Telehealth: Payer: Self-pay | Admitting: Allergy and Immunology

## 2020-11-03 MED ORDER — LEVALBUTEROL HCL 1.25 MG/3ML IN NEBU: INHALATION_SOLUTION | RESPIRATORY_TRACT | 1 refills | Status: DC

## 2020-11-03 MED ORDER — SPIRIVA RESPIMAT 1.25 MCG/ACT IN AERS: 2.0000 | INHALATION_SPRAY | Freq: Every day | RESPIRATORY_TRACT | 1 refills | Status: DC

## 2020-11-03 MED ORDER — IPRATROPIUM BROMIDE 0.02 % IN SOLN: RESPIRATORY_TRACT | 1 refills | Status: DC

## 2020-11-03 NOTE — Telephone Encounter (Signed)
[name] called in and states her medications were sent to the wrong pharmacy.  [name] would like her medications sent to [facility] in [place].  Also, [name] asked about her nebulizer solution.  Amyrie states she was told it was approved and would be sent to the pharmacy and it was not.  Please advise.

## 2020-11-03 NOTE — Telephone Encounter (Signed)
Prescriptions sent to requested pharmacy.

## 2020-11-07 ENCOUNTER — Other Ambulatory Visit: Payer: Self-pay | Admitting: *Deleted

## 2020-11-07 MED ORDER — LEVALBUTEROL HCL 1.25 MG/3ML IN NEBU: INHALATION_SOLUTION | RESPIRATORY_TRACT | 1 refills | Status: AC

## 2020-11-07 NOTE — Telephone Encounter (Signed)
Called patient and inquired if she wanted to restart her Xolair for asthma. She advised she did and I have obtained approval. Advised patient will submit to [facility] pharmacy and they will reach out to her to get ok to ship and I will let her know delivery so we can set up date to restart

## 2020-11-08 ENCOUNTER — Other Ambulatory Visit: Payer: Self-pay

## 2020-11-08 DIAGNOSIS — J3081 Allergic rhinitis due to animal (cat) (dog) hair and dander: Secondary | ICD-10-CM | POA: Diagnosis not present

## 2020-11-08 MED ORDER — SYMBICORT 160-4.5 MCG/ACT IN AERO: INHALATION_SPRAY | RESPIRATORY_TRACT | 5 refills | Status: AC

## 2020-11-08 NOTE — Progress Notes (Signed)
VIALS EXP 11-08-21 °

## 2020-11-09 DIAGNOSIS — J3089 Other allergic rhinitis: Secondary | ICD-10-CM | POA: Diagnosis not present

## 2020-11-10 ENCOUNTER — Other Ambulatory Visit: Payer: Self-pay

## 2020-11-10 MED ORDER — SPIRIVA RESPIMAT 1.25 MCG/ACT IN AERS: 2.0000 | INHALATION_SPRAY | Freq: Every day | RESPIRATORY_TRACT | 5 refills | Status: AC

## 2020-11-14 ENCOUNTER — Ambulatory Visit (INDEPENDENT_AMBULATORY_CARE_PROVIDER_SITE_OTHER): Payer: Medicare Other

## 2020-11-14 ENCOUNTER — Other Ambulatory Visit: Payer: Self-pay

## 2020-11-14 DIAGNOSIS — J309 Allergic rhinitis, unspecified: Secondary | ICD-10-CM

## 2020-11-14 MED ORDER — EPINEPHRINE 0.3 MG/0.3ML IJ SOAJ: 0.3000 mg | Freq: Once | INTRAMUSCULAR | 1 refills | Status: AC | PRN

## 2020-11-14 NOTE — Progress Notes (Signed)
Immunotherapy   Patient Details  Name: [name] MRN: [id number] Date of Birth: August 18, 1986  11/14/2020  [name] started injections for  DMITE-WEED & CAT-DOG Following schedule: B  Frequency:2 times per week Epi-Pen:Prescription for Epi-Pen given  Patient waited 30 minutes in office post injection. Patient had no local reaction or systemic symptoms.  Consent signed and patient instructions given.   [name] 11/14/2020, 1:33 PM

## 2020-11-15 ENCOUNTER — Telehealth: Payer: Self-pay | Admitting: Allergy and Immunology

## 2020-11-15 ENCOUNTER — Telehealth: Payer: Self-pay

## 2020-11-15 NOTE — Telephone Encounter (Signed)
Please inform Vicki that we sometimes do immunotherapy and Xolair injections on the same day.  Lets look into her insurance to see if this is allowed.

## 2020-11-15 NOTE — Telephone Encounter (Signed)
I spoke with Candice in billing to see how we could handle [name]'s injections with her insurance.  Candice states that on the days that [name] comes in for her allergy injections and Xolair injection, we will just charge her insurance for the Xolair injection.  Candice states no insurance will cover both injections and this is something we do with patients that want to receive both injections on the same day.  Does this sound acceptable?

## 2020-11-15 NOTE — Telephone Encounter (Signed)
[name] called in to schedule her Xolair appointment.  [name] was wondering since she never had any issues with her shots if she could get her Xolair and allergy injections on the same day.  [name] states she lives far away and can't always make it here.  Please advise.

## 2020-11-15 NOTE — Telephone Encounter (Signed)
That sounds acceptable

## 2020-11-15 NOTE — Telephone Encounter (Signed)
Left message for patient to call.  Please let her know we have her Xolair and can go ahead and schedule her start injection appointment.

## 2020-11-17 NOTE — Telephone Encounter (Signed)
Please schedule. Thank you

## 2020-11-21 ENCOUNTER — Other Ambulatory Visit: Payer: Self-pay

## 2020-11-21 ENCOUNTER — Ambulatory Visit (INDEPENDENT_AMBULATORY_CARE_PROVIDER_SITE_OTHER): Payer: Medicare Other

## 2020-11-21 DIAGNOSIS — J4550 Severe persistent asthma, uncomplicated: Secondary | ICD-10-CM

## 2020-11-21 MED ORDER — OMALIZUMAB 150 MG ~~LOC~~ SOLR
150.0000 mg | SUBCUTANEOUS | Status: AC
Start: 2020-11-21 — End: ?
  Administered 2020-11-21 – 2021-03-20 (×4): 150 mg via SUBCUTANEOUS

## 2020-11-21 NOTE — Progress Notes (Signed)
Patient restarted Xolair injections for her Asthma. She receives 150 mg every 4 weeks. Patient waited 30 minutes in office post injection. No local or systemic symptoms.

## 2020-11-22 ENCOUNTER — Other Ambulatory Visit: Payer: Self-pay | Admitting: Allergy and Immunology

## 2020-11-23 ENCOUNTER — Ambulatory Visit (INDEPENDENT_AMBULATORY_CARE_PROVIDER_SITE_OTHER): Payer: Medicare Other | Admitting: *Deleted

## 2020-11-23 DIAGNOSIS — J309 Allergic rhinitis, unspecified: Secondary | ICD-10-CM

## 2020-11-28 ENCOUNTER — Ambulatory Visit: Payer: Medicare Other | Admitting: Allergy and Immunology

## 2020-11-30 ENCOUNTER — Ambulatory Visit (INDEPENDENT_AMBULATORY_CARE_PROVIDER_SITE_OTHER): Payer: Medicare Other | Admitting: Allergy and Immunology

## 2020-11-30 ENCOUNTER — Encounter: Payer: Self-pay | Admitting: Allergy and Immunology

## 2020-11-30 ENCOUNTER — Other Ambulatory Visit: Payer: Self-pay

## 2020-11-30 VITALS — BP 112/76 | HR 91 | Resp 16

## 2020-11-30 DIAGNOSIS — L989 Disorder of the skin and subcutaneous tissue, unspecified: Secondary | ICD-10-CM | POA: Diagnosis not present

## 2020-11-30 DIAGNOSIS — K219 Gastro-esophageal reflux disease without esophagitis: Secondary | ICD-10-CM

## 2020-11-30 DIAGNOSIS — J3089 Other allergic rhinitis: Secondary | ICD-10-CM | POA: Diagnosis not present

## 2020-11-30 DIAGNOSIS — J309 Allergic rhinitis, unspecified: Secondary | ICD-10-CM | POA: Diagnosis not present

## 2020-11-30 DIAGNOSIS — J4550 Severe persistent asthma, uncomplicated: Secondary | ICD-10-CM | POA: Diagnosis not present

## 2020-11-30 NOTE — Patient Instructions (Addendum)
  1. Continue to perform Allergen avoidance measures - House dust mite  2. Treat and prevent inflammation:   A. Flonase - one spray each nostril 1- 2 times per day   B. montelukast 10 mg - one tablet one time per day  C. Combination of the following inhalers:   - Symbicort 160 - 2 inhalations 2 times per day with spacer   - Spiriva 1.25 Respimat - 2 inhalations 1 time per day  D.  Omalizumab injections  E.  Allergen immunotherapy   3. Treat and prevent reflux:   A.  Omeprazole 40 mg - 2 times per day  4. If needed:   A. nasal saline wash  B. Claritin 10 mg tablet 1 time per day  C. ProAir HFA 2 puffs   D. Xopenex + Ipratropium nebulizer every 4-6 hours.    E. Ultravate cream apply to skin lesion at onset daily until resolved  5. Further treatment?   6. Return to clinic in 12 weeks or earlier if problem

## 2020-11-30 NOTE — Progress Notes (Signed)
[place] - [place] - [place] - [place] - [place]   Follow-up Note  Referring Provider: No ref. provider found Primary Provider: Patient, No Pcp Per Date of Office Visit: 11/30/2020  Subjective:   [name] (DOB: Dec 26, 1986) is a 34 y.o. female who [facility] in re-evaluation of the following:  HPI: [name] returns to this clinic in reevaluation of asthma and allergic rhinitis and LPR and tobacco smoke exposure and inflammatory dermatosis.  Her last visit to this clinic was 31 October 2020.  She is better since her last visit but still has lots of coughing and congestion in her chest.  She has been using a combination of Spiriva and Symbicort and still has to use her short acting bronchodilator twice a day.  She still continues to smoke extensively.  Her reflux is doing well.  No need to use a topical steroid for her skin condition  Allergies as of 11/30/2020      Reactions   Adhesive [tape] Rash   PAPER TAPE IS OKAY      Medication List      albuterol 108 (90 Base) MCG/ACT inhaler Commonly known as: Ventolin HFA INHALE 2 PUFFS BY MOUTH EVERY 4 TO 6 HOURS AS NEEDED FOR COUGH OR WHEEZING   EPINEPHrine 0.3 mg/0.3 mL Soaj injection Commonly known as: EpiPen 2-Pak Inject 0.3 mg into the muscle once as needed for anaphylaxis.   halobetasol 0.05 % ointment Commonly known as: ULTRAVATE Can apply to skin lesions once daily until resolved.   ipratropium 0.02 % nebulizer solution Commonly known as: ATROVENT Can use one vial in nebulizer every four to six hours as needed for cough or wheeze.   levalbuterol 1.25 MG/3ML nebulizer solution Commonly known as: XOPENEX Can use one vial in nebulizer every four to six hours as needed for cough or wheeze.   loratadine 10 MG tablet Commonly known as: CLARITIN Can take one tablet by mouth once daily if needed.   mometasone 50 MCG/ACT nasal spray Commonly known as: NASONEX Use  one spray in each nostril one to two times daily.   montelukast 10 MG tablet Commonly known as: SINGULAIR Take 1 tablet (10 mg total) by mouth at bedtime.   nystatin 100000 UNIT/ML suspension Commonly known as: MYCOSTATIN Swish and swallow three times daily for thrush   omeprazole 40 MG capsule Commonly known as: PRILOSEC Take one capsule by mouth twice daily.   Spiriva Respimat 1.25 MCG/ACT Aers Generic drug: Tiotropium Bromide Monohydrate Inhale 2 puffs into the lungs daily.   Symbicort 160-4.5 MCG/ACT inhaler Generic drug: budesonide-formoterol Inhale two puffs using spacer twice daily to prevent cough or wheeze.  Rinse, gargle, and spit after use.       Past Medical History:  Diagnosis Date   Anxiety    Asthma    Bartholin cyst LEFT SIDE   Bicornuate uterus S/P EXCISION RIGHT UTERUS HORN 2007   Broken thumb    Right   Depression    GERD (gastroesophageal reflux disease)    Headache    sleep, xanax   History of ovarian cyst    PTSD (post-traumatic stress disorder)    Schizophrenia (HCC)    Sleep apnea     Past Surgical History:  Procedure Laterality Date   APPENDECTOMY     BARTHOLIN CYST MARSUPIALIZATION  07/18/2012   Procedure: BARTHOLIN CYST MARSUPIALIZATION;  Surgeon: Ok Edwards, MD;  Location: [facility];  Service: Gynecology;  Laterality: Left;  Left Bartholin Duct Marsupilization. One hour.    BARTHOLIN GLAND CYST EXCISION Left 02/11/2015   Procedure: BARTHOLIN GLAND EXCISION;  Surgeon: Mitchel Honour, DO;  Location: [facility];  Service: Gynecology;  Laterality: Left;   CARDIAC CATHETERIZATION     DIAGNOSTIC LAPAROSCOPY     LACERATION REPAIR Left 05/29/2016   Procedure: REPAIR MULTIPLE LACERATIONS;  Surgeon: Mack Hook, MD;  Location: [facility];  Service: Orthopedics;  Laterality: Left;  laceration left forearm    LAPAROSCOPIC APPENDECTOMY AND RIGHT OVARIAN CYSTECTOMY  09-04-2004   LAPAROSCOPIC  EXCISION HYPOPLASTIC RIGHT UTERINE HORN AND RIGHT SALPINGOOPHECTOMY  03-18-2006   LIGAMENT REPAIR Right 12/2018   Thumb, radial collateral ligament repair/reconstrution   LUMBAR LAMINECTOMY/DECOMPRESSION MICRODISCECTOMY Left 01/12/2019   Procedure: Microdiscectomy Left Lumbar Three-Four;  Surgeon: Donalee Citrin, MD;  Location[facility];  Service: Neurosurgery;  Laterality: Left;  Microdiscectomy Left Lumbar Three-Four   TENDON REPAIR Left 05/29/2016   Procedure: TENDON REPAIR;  Surgeon: Mack Hook, MD;  Location: [facility];  Service: Orthopedics;  Laterality: Left;   WISDOM TOOTH EXTRACTION      Review of systems negative except as noted in HPI / PMHx or noted below:  Review of Systems  Constitutional: Negative.   HENT: Negative.   Eyes: Negative.   Respiratory: Negative.   Cardiovascular: Negative.   Gastrointestinal: Negative.   Genitourinary: Negative.   Musculoskeletal: Negative.   Skin: Negative.   Neurological: Negative.   Endo/Heme/Allergies: Negative.   Psychiatric/Behavioral: Negative.      Objective:   Vitals:   11/30/20 1541  BP: 112/76  Pulse: 91  Resp: 16  SpO2: 99%          Physical Exam Constitutional:      Appearance: She is not diaphoretic.  HENT:     Head: Normocephalic.     Right Ear: Tympanic membrane, ear canal and external ear normal.     Left Ear: Tympanic membrane, ear canal and external ear normal.     Nose: Nose normal. No mucosal edema or rhinorrhea.     Mouth/Throat:     Pharynx: Uvula midline. No oropharyngeal exudate.  Eyes:     Conjunctiva/sclera: Conjunctivae normal.  Neck:     Thyroid: No thyromegaly.     Trachea: Trachea normal. No tracheal tenderness or tracheal deviation.  Cardiovascular:     Rate and Rhythm: Normal rate and regular rhythm.     Heart sounds: Normal heart sounds, S1 normal and S2 normal. No murmur heard.   Pulmonary:     Effort: No respiratory distress.     Breath sounds: No stridor.  Wheezing (Bilateral expiratory wheezing) present. No rales.  Lymphadenopathy:     Head:     Right side of head: No tonsillar adenopathy.     Left side of head: No tonsillar adenopathy.     Cervical: No cervical adenopathy.  Skin:    Findings: No erythema or rash.     Nails: There is no clubbing.  Neurological:     Mental Status: She is alert.     Diagnostics:    Spirometry was performed and demonstrated an FEV1 of 1.92 at 62 % of predicted.  Assessment and Plan:   1. Not well controlled severe persistent asthma   2. Other allergic rhinitis   3. LPRD (laryngopharyngeal reflux disease)   4. Inflammatory dermatosis       1. Continue to perform Allergen avoidance measures - House dust mite  2. Treat and prevent  inflammation:   A. Flonase - one spray each nostril 1- 2 times per day   B. montelukast 10 mg - one tablet one time per day  C. Combination of the following inhalers:   - Symbicort 160 - 2 inhalations 2 times per day with spacer   - Spiriva 1.25 Respimat - 2 inhalations 1 time per day  D.  Omalizumab injections  E.  Allergen immunotherapy   3. Treat and prevent reflux:   A.  Omeprazole 40 mg - 2 times per day  4. If needed:   A. nasal saline wash  B. Claritin 10 mg tablet 1 time per day  C. ProAir HFA 2 puffs   D. Xopenex + Ipratropium nebulizer every 4-6 hours.    E. Ultravate cream apply to skin lesion at onset daily until resolved  5. Further treatment?   6. Return to clinic in 12 weeks or earlier if problem   [name] is stable with stability defined by the fact that she still has significant inflammation and irritation of her airway.  She is never going to clear her airway issue unless she stopped smoking.  She may be a candidate for tezepelumab to replace her omalizumab once it is available in early 2022.  She will keep on this large collection of therapy directed against inflammation and reflux and I will see her back in his clinic in 12 weeks or earlier  if there is a problem.  Laurette Schimke, MD Allergy / Immunology [facility]

## 2020-12-01 ENCOUNTER — Encounter: Payer: Self-pay | Admitting: Allergy and Immunology

## 2020-12-07 ENCOUNTER — Ambulatory Visit (INDEPENDENT_AMBULATORY_CARE_PROVIDER_SITE_OTHER): Payer: Medicare Other | Admitting: *Deleted

## 2020-12-07 DIAGNOSIS — J309 Allergic rhinitis, unspecified: Secondary | ICD-10-CM | POA: Diagnosis not present

## 2020-12-12 ENCOUNTER — Ambulatory Visit (INDEPENDENT_AMBULATORY_CARE_PROVIDER_SITE_OTHER): Payer: Medicare Other | Admitting: *Deleted

## 2020-12-12 DIAGNOSIS — J309 Allergic rhinitis, unspecified: Secondary | ICD-10-CM | POA: Diagnosis not present

## 2020-12-19 ENCOUNTER — Ambulatory Visit (INDEPENDENT_AMBULATORY_CARE_PROVIDER_SITE_OTHER): Payer: Medicare Other

## 2020-12-19 DIAGNOSIS — J309 Allergic rhinitis, unspecified: Secondary | ICD-10-CM | POA: Diagnosis not present

## 2020-12-21 ENCOUNTER — Ambulatory Visit: Payer: Medicare Other

## 2020-12-28 ENCOUNTER — Other Ambulatory Visit: Payer: Self-pay | Admitting: Allergy and Immunology

## 2021-01-02 ENCOUNTER — Ambulatory Visit: Payer: Medicare Other

## 2021-01-09 ENCOUNTER — Ambulatory Visit (INDEPENDENT_AMBULATORY_CARE_PROVIDER_SITE_OTHER): Payer: Medicare Other | Admitting: *Deleted

## 2021-01-09 DIAGNOSIS — J4550 Severe persistent asthma, uncomplicated: Secondary | ICD-10-CM

## 2021-01-23 ENCOUNTER — Ambulatory Visit (INDEPENDENT_AMBULATORY_CARE_PROVIDER_SITE_OTHER): Payer: Medicare Other

## 2021-01-23 DIAGNOSIS — J309 Allergic rhinitis, unspecified: Secondary | ICD-10-CM

## 2021-01-30 ENCOUNTER — Ambulatory Visit: Payer: Medicare Other

## 2021-01-30 ENCOUNTER — Ambulatory Visit (INDEPENDENT_AMBULATORY_CARE_PROVIDER_SITE_OTHER): Payer: Medicare Other | Admitting: *Deleted

## 2021-01-30 DIAGNOSIS — J309 Allergic rhinitis, unspecified: Secondary | ICD-10-CM

## 2021-02-06 ENCOUNTER — Ambulatory Visit (INDEPENDENT_AMBULATORY_CARE_PROVIDER_SITE_OTHER): Payer: Medicare Other

## 2021-02-06 ENCOUNTER — Other Ambulatory Visit: Payer: Self-pay

## 2021-02-06 DIAGNOSIS — J4550 Severe persistent asthma, uncomplicated: Secondary | ICD-10-CM | POA: Diagnosis not present

## 2021-02-10 DIAGNOSIS — Z760 Encounter for issue of repeat prescription: Secondary | ICD-10-CM | POA: Diagnosis not present

## 2021-02-10 DIAGNOSIS — G4700 Insomnia, unspecified: Secondary | ICD-10-CM | POA: Diagnosis not present

## 2021-02-13 ENCOUNTER — Ambulatory Visit (INDEPENDENT_AMBULATORY_CARE_PROVIDER_SITE_OTHER): Payer: Medicare Other | Admitting: *Deleted

## 2021-02-13 DIAGNOSIS — J309 Allergic rhinitis, unspecified: Secondary | ICD-10-CM

## 2021-02-13 DIAGNOSIS — J4550 Severe persistent asthma, uncomplicated: Secondary | ICD-10-CM

## 2021-02-20 ENCOUNTER — Ambulatory Visit (INDEPENDENT_AMBULATORY_CARE_PROVIDER_SITE_OTHER): Payer: Medicare Other | Admitting: *Deleted

## 2021-02-20 DIAGNOSIS — J309 Allergic rhinitis, unspecified: Secondary | ICD-10-CM | POA: Diagnosis not present

## 2021-02-23 ENCOUNTER — Ambulatory Visit: Payer: Medicare Other | Admitting: Allergy and Immunology

## 2021-02-27 ENCOUNTER — Ambulatory Visit: Payer: Medicare Other

## 2021-03-03 ENCOUNTER — Other Ambulatory Visit: Payer: Self-pay

## 2021-03-03 MED ORDER — OMEPRAZOLE 40 MG PO CPDR
DELAYED_RELEASE_CAPSULE | ORAL | 3 refills | Status: DC
Start: 2021-03-03 — End: 2021-03-09

## 2021-03-03 MED ORDER — IPRATROPIUM BROMIDE 0.02 % IN SOLN: RESPIRATORY_TRACT | 1 refills | Status: AC

## 2021-03-03 MED ORDER — LORATADINE 10 MG PO TABS
ORAL_TABLET | ORAL | 3 refills | Status: DC
Start: 2021-03-03 — End: 2021-03-09

## 2021-03-03 MED ORDER — MONTELUKAST SODIUM 10 MG PO TABS
10.0000 mg | ORAL_TABLET | Freq: Every day | ORAL | 3 refills | Status: DC
Start: 2021-03-03 — End: 2021-03-09

## 2021-03-06 ENCOUNTER — Ambulatory Visit: Payer: Self-pay

## 2021-03-09 ENCOUNTER — Telehealth: Payer: Self-pay | Admitting: Allergy and Immunology

## 2021-03-09 ENCOUNTER — Other Ambulatory Visit: Payer: Self-pay

## 2021-03-09 MED ORDER — LORATADINE 10 MG PO TABS: ORAL_TABLET | ORAL | 5 refills | Status: AC

## 2021-03-09 MED ORDER — OMEPRAZOLE 40 MG PO CPDR: DELAYED_RELEASE_CAPSULE | ORAL | 5 refills | Status: AC

## 2021-03-09 MED ORDER — MONTELUKAST SODIUM 10 MG PO TABS: 10.0000 mg | ORAL_TABLET | Freq: Every day | ORAL | 5 refills | Status: AC

## 2021-03-09 NOTE — Telephone Encounter (Signed)
[name] called in and would like to switch pharmacies.  [name] would like Omeprazole, Montelukast, and Cetirizine sent to [facility] in [place].

## 2021-03-09 NOTE — Telephone Encounter (Signed)
Refills for Omeprazole and Montelukast were sent in. Confirmed with patient that she is taking claritin instead of zyrtec. Refill for claritin was sent as well. Patient was notified

## 2021-03-10 DIAGNOSIS — Z23 Encounter for immunization: Secondary | ICD-10-CM | POA: Diagnosis not present

## 2021-03-20 ENCOUNTER — Ambulatory Visit (INDEPENDENT_AMBULATORY_CARE_PROVIDER_SITE_OTHER): Payer: Medicare Other | Admitting: *Deleted

## 2021-03-20 DIAGNOSIS — J4550 Severe persistent asthma, uncomplicated: Secondary | ICD-10-CM

## 2021-03-20 DIAGNOSIS — J309 Allergic rhinitis, unspecified: Secondary | ICD-10-CM

## 2021-03-27 ENCOUNTER — Ambulatory Visit (INDEPENDENT_AMBULATORY_CARE_PROVIDER_SITE_OTHER): Payer: Medicare Other | Admitting: *Deleted

## 2021-03-27 ENCOUNTER — Ambulatory Visit: Payer: Medicare Other

## 2021-03-27 DIAGNOSIS — J309 Allergic rhinitis, unspecified: Secondary | ICD-10-CM

## 2021-03-29 DIAGNOSIS — J029 Acute pharyngitis, unspecified: Secondary | ICD-10-CM | POA: Diagnosis not present

## 2021-03-29 DIAGNOSIS — Z8709 Personal history of other diseases of the respiratory system: Secondary | ICD-10-CM | POA: Diagnosis not present

## 2021-03-29 DIAGNOSIS — R067 Sneezing: Secondary | ICD-10-CM | POA: Diagnosis not present

## 2021-03-29 DIAGNOSIS — R059 Cough, unspecified: Secondary | ICD-10-CM | POA: Diagnosis not present

## 2021-03-29 DIAGNOSIS — R062 Wheezing: Secondary | ICD-10-CM | POA: Diagnosis not present

## 2021-03-29 DIAGNOSIS — R490 Dysphonia: Secondary | ICD-10-CM | POA: Diagnosis not present

## 2021-03-31 DIAGNOSIS — Z23 Encounter for immunization: Secondary | ICD-10-CM | POA: Diagnosis not present

## 2021-04-03 ENCOUNTER — Ambulatory Visit (INDEPENDENT_AMBULATORY_CARE_PROVIDER_SITE_OTHER): Payer: Medicare Other | Admitting: *Deleted

## 2021-04-03 DIAGNOSIS — J309 Allergic rhinitis, unspecified: Secondary | ICD-10-CM

## 2021-04-05 DIAGNOSIS — Z79899 Other long term (current) drug therapy: Secondary | ICD-10-CM | POA: Diagnosis not present

## 2021-04-05 DIAGNOSIS — G4733 Obstructive sleep apnea (adult) (pediatric): Secondary | ICD-10-CM | POA: Diagnosis not present

## 2021-04-05 DIAGNOSIS — R059 Cough, unspecified: Secondary | ICD-10-CM | POA: Diagnosis not present

## 2021-04-05 DIAGNOSIS — K219 Gastro-esophageal reflux disease without esophagitis: Secondary | ICD-10-CM | POA: Diagnosis not present

## 2021-04-05 DIAGNOSIS — J208 Acute bronchitis due to other specified organisms: Secondary | ICD-10-CM | POA: Diagnosis not present

## 2021-04-05 DIAGNOSIS — Z9109 Other allergy status, other than to drugs and biological substances: Secondary | ICD-10-CM | POA: Diagnosis not present

## 2021-04-05 DIAGNOSIS — J329 Chronic sinusitis, unspecified: Secondary | ICD-10-CM | POA: Diagnosis not present

## 2021-04-05 DIAGNOSIS — F17200 Nicotine dependence, unspecified, uncomplicated: Secondary | ICD-10-CM | POA: Diagnosis not present

## 2021-04-05 DIAGNOSIS — J209 Acute bronchitis, unspecified: Secondary | ICD-10-CM | POA: Diagnosis not present

## 2021-04-05 DIAGNOSIS — J019 Acute sinusitis, unspecified: Secondary | ICD-10-CM | POA: Diagnosis not present

## 2021-04-10 ENCOUNTER — Ambulatory Visit (INDEPENDENT_AMBULATORY_CARE_PROVIDER_SITE_OTHER): Payer: Medicare Other | Admitting: *Deleted

## 2021-04-10 DIAGNOSIS — J309 Allergic rhinitis, unspecified: Secondary | ICD-10-CM

## 2021-04-17 ENCOUNTER — Ambulatory Visit: Payer: Medicare Other

## 2021-04-24 ENCOUNTER — Ambulatory Visit: Payer: Medicare Other

## 2021-04-25 DIAGNOSIS — Z9109 Other allergy status, other than to drugs and biological substances: Secondary | ICD-10-CM | POA: Diagnosis not present

## 2021-04-25 DIAGNOSIS — M546 Pain in thoracic spine: Secondary | ICD-10-CM | POA: Diagnosis not present

## 2021-04-25 DIAGNOSIS — F17290 Nicotine dependence, other tobacco product, uncomplicated: Secondary | ICD-10-CM | POA: Diagnosis not present

## 2021-04-25 DIAGNOSIS — F32A Depression, unspecified: Secondary | ICD-10-CM | POA: Diagnosis not present

## 2021-04-25 DIAGNOSIS — F4310 Post-traumatic stress disorder, unspecified: Secondary | ICD-10-CM | POA: Diagnosis not present

## 2021-04-25 DIAGNOSIS — R0602 Shortness of breath: Secondary | ICD-10-CM | POA: Diagnosis not present

## 2021-04-25 DIAGNOSIS — Z791 Long term (current) use of non-steroidal anti-inflammatories (NSAID): Secondary | ICD-10-CM | POA: Diagnosis not present

## 2021-04-25 DIAGNOSIS — Z79899 Other long term (current) drug therapy: Secondary | ICD-10-CM | POA: Diagnosis not present

## 2021-04-25 DIAGNOSIS — R030 Elevated blood-pressure reading, without diagnosis of hypertension: Secondary | ICD-10-CM | POA: Diagnosis not present

## 2021-04-25 DIAGNOSIS — J45909 Unspecified asthma, uncomplicated: Secondary | ICD-10-CM | POA: Diagnosis not present

## 2021-04-25 DIAGNOSIS — K219 Gastro-esophageal reflux disease without esophagitis: Secondary | ICD-10-CM | POA: Diagnosis not present

## 2021-05-03 DIAGNOSIS — Z79899 Other long term (current) drug therapy: Secondary | ICD-10-CM | POA: Diagnosis not present

## 2021-05-03 DIAGNOSIS — M545 Low back pain, unspecified: Secondary | ICD-10-CM | POA: Diagnosis not present

## 2021-05-03 DIAGNOSIS — K219 Gastro-esophageal reflux disease without esophagitis: Secondary | ICD-10-CM | POA: Diagnosis not present

## 2021-05-03 DIAGNOSIS — F17210 Nicotine dependence, cigarettes, uncomplicated: Secondary | ICD-10-CM | POA: Diagnosis not present

## 2021-05-03 DIAGNOSIS — M549 Dorsalgia, unspecified: Secondary | ICD-10-CM | POA: Diagnosis not present

## 2021-05-03 DIAGNOSIS — S299XXA Unspecified injury of thorax, initial encounter: Secondary | ICD-10-CM | POA: Diagnosis not present

## 2021-05-03 DIAGNOSIS — M546 Pain in thoracic spine: Secondary | ICD-10-CM | POA: Diagnosis not present

## 2021-05-11 DIAGNOSIS — F419 Anxiety disorder, unspecified: Secondary | ICD-10-CM | POA: Diagnosis not present

## 2021-05-11 DIAGNOSIS — Z760 Encounter for issue of repeat prescription: Secondary | ICD-10-CM | POA: Diagnosis not present

## 2021-05-11 DIAGNOSIS — F32A Depression, unspecified: Secondary | ICD-10-CM | POA: Diagnosis not present

## 2021-06-04 DIAGNOSIS — F419 Anxiety disorder, unspecified: Secondary | ICD-10-CM | POA: Diagnosis not present

## 2021-06-04 DIAGNOSIS — G4700 Insomnia, unspecified: Secondary | ICD-10-CM | POA: Diagnosis not present

## 2021-06-04 DIAGNOSIS — Z760 Encounter for issue of repeat prescription: Secondary | ICD-10-CM | POA: Diagnosis not present

## 2021-07-29 DIAGNOSIS — F17200 Nicotine dependence, unspecified, uncomplicated: Secondary | ICD-10-CM | POA: Diagnosis not present

## 2021-07-29 DIAGNOSIS — G4700 Insomnia, unspecified: Secondary | ICD-10-CM | POA: Diagnosis not present

## 2021-07-29 DIAGNOSIS — M25521 Pain in right elbow: Secondary | ICD-10-CM | POA: Diagnosis not present

## 2021-07-29 DIAGNOSIS — M79601 Pain in right arm: Secondary | ICD-10-CM | POA: Diagnosis not present

## 2021-07-29 DIAGNOSIS — F32A Depression, unspecified: Secondary | ICD-10-CM | POA: Diagnosis not present

## 2021-07-29 DIAGNOSIS — Z91048 Other nonmedicinal substance allergy status: Secondary | ICD-10-CM | POA: Diagnosis not present

## 2021-07-29 DIAGNOSIS — G4733 Obstructive sleep apnea (adult) (pediatric): Secondary | ICD-10-CM | POA: Diagnosis not present

## 2021-07-29 DIAGNOSIS — K219 Gastro-esophageal reflux disease without esophagitis: Secondary | ICD-10-CM | POA: Diagnosis not present

## 2021-07-29 DIAGNOSIS — Z79899 Other long term (current) drug therapy: Secondary | ICD-10-CM | POA: Diagnosis not present

## 2021-07-29 DIAGNOSIS — Z87892 Personal history of anaphylaxis: Secondary | ICD-10-CM | POA: Diagnosis not present

## 2021-07-29 DIAGNOSIS — M7711 Lateral epicondylitis, right elbow: Secondary | ICD-10-CM | POA: Diagnosis not present

## 2021-09-08 DIAGNOSIS — F17200 Nicotine dependence, unspecified, uncomplicated: Secondary | ICD-10-CM | POA: Diagnosis not present

## 2021-09-08 DIAGNOSIS — K219 Gastro-esophageal reflux disease without esophagitis: Secondary | ICD-10-CM | POA: Diagnosis not present

## 2021-09-08 DIAGNOSIS — F32A Depression, unspecified: Secondary | ICD-10-CM | POA: Diagnosis not present

## 2021-09-08 DIAGNOSIS — Z79899 Other long term (current) drug therapy: Secondary | ICD-10-CM | POA: Diagnosis not present

## 2021-09-08 DIAGNOSIS — Z7951 Long term (current) use of inhaled steroids: Secondary | ICD-10-CM | POA: Diagnosis not present

## 2021-09-08 DIAGNOSIS — F4310 Post-traumatic stress disorder, unspecified: Secondary | ICD-10-CM | POA: Diagnosis not present

## 2021-09-08 DIAGNOSIS — J45909 Unspecified asthma, uncomplicated: Secondary | ICD-10-CM | POA: Diagnosis not present

## 2021-09-08 DIAGNOSIS — M25521 Pain in right elbow: Secondary | ICD-10-CM | POA: Diagnosis not present

## 2021-10-27 DIAGNOSIS — K2289 Other specified disease of esophagus: Secondary | ICD-10-CM | POA: Diagnosis not present

## 2021-10-27 DIAGNOSIS — L03211 Cellulitis of face: Secondary | ICD-10-CM | POA: Diagnosis not present

## 2021-10-27 DIAGNOSIS — G4700 Insomnia, unspecified: Secondary | ICD-10-CM | POA: Diagnosis not present

## 2021-10-27 DIAGNOSIS — Z760 Encounter for issue of repeat prescription: Secondary | ICD-10-CM | POA: Diagnosis not present

## 2021-12-01 DIAGNOSIS — F419 Anxiety disorder, unspecified: Secondary | ICD-10-CM | POA: Diagnosis not present

## 2021-12-08 DIAGNOSIS — F4310 Post-traumatic stress disorder, unspecified: Secondary | ICD-10-CM | POA: Diagnosis not present

## 2021-12-08 DIAGNOSIS — F411 Generalized anxiety disorder: Secondary | ICD-10-CM | POA: Diagnosis not present

## 2021-12-08 DIAGNOSIS — F5101 Primary insomnia: Secondary | ICD-10-CM | POA: Diagnosis not present
